# Patient Record
Sex: Male | Born: 1965
Health system: Southern US, Community
[De-identification: ages and names within clinical notes are randomized; demographics above are authoritative.]

## PROBLEM LIST (undated history)

## (undated) DIAGNOSIS — I10 Essential (primary) hypertension: Secondary | ICD-10-CM

## (undated) DIAGNOSIS — F17200 Nicotine dependence, unspecified, uncomplicated: Secondary | ICD-10-CM

## (undated) DIAGNOSIS — J90 Pleural effusion, not elsewhere classified: Secondary | ICD-10-CM

## (undated) DIAGNOSIS — I2510 Atherosclerotic heart disease of native coronary artery without angina pectoris: Secondary | ICD-10-CM

## (undated) DIAGNOSIS — I639 Cerebral infarction, unspecified: Secondary | ICD-10-CM

## (undated) DIAGNOSIS — I219 Acute myocardial infarction, unspecified: Secondary | ICD-10-CM

## (undated) DIAGNOSIS — E785 Hyperlipidemia, unspecified: Secondary | ICD-10-CM

## (undated) DIAGNOSIS — K219 Gastro-esophageal reflux disease without esophagitis: Secondary | ICD-10-CM

## (undated) HISTORY — PX: KNEE SURGERY: SHX244

## (undated) HISTORY — PX: BACK SURGERY: SHX140

## (undated) HISTORY — DX: Pleural effusion, not elsewhere classified: J90

## (undated) HISTORY — DX: Cerebral infarction, unspecified: I63.9

## (undated) HISTORY — DX: Acute myocardial infarction, unspecified: I21.9

## (undated) HISTORY — PX: KIDNEY TRANSPLANT: SHX239

## (undated) HISTORY — PX: CORONARY ARTERY BYPASS GRAFT: SHX141

---

## 2007-07-24 ENCOUNTER — Emergency Department (HOSPITAL_COMMUNITY): Admission: EM | Admit: 2007-07-24 | Discharge: 2007-07-24 | Payer: Self-pay | Admitting: Emergency Medicine

## 2010-10-14 ENCOUNTER — Emergency Department (HOSPITAL_COMMUNITY): Payer: Medicare Other

## 2010-10-14 ENCOUNTER — Emergency Department (HOSPITAL_COMMUNITY)
Admission: EM | Admit: 2010-10-14 | Discharge: 2010-10-14 | Disposition: A | Discharge status: Home / Self Care | Payer: Medicare Other | Attending: Emergency Medicine | Admitting: Emergency Medicine

## 2010-10-14 DIAGNOSIS — R109 Unspecified abdominal pain: Secondary | ICD-10-CM | POA: Insufficient documentation

## 2010-10-14 DIAGNOSIS — R222 Localized swelling, mass and lump, trunk: Secondary | ICD-10-CM | POA: Insufficient documentation

## 2010-10-14 DIAGNOSIS — K7689 Other specified diseases of liver: Secondary | ICD-10-CM | POA: Insufficient documentation

## 2010-10-14 DIAGNOSIS — N433 Hydrocele, unspecified: Secondary | ICD-10-CM | POA: Insufficient documentation

## 2010-10-14 DIAGNOSIS — K5730 Diverticulosis of large intestine without perforation or abscess without bleeding: Secondary | ICD-10-CM | POA: Insufficient documentation

## 2010-10-14 LAB — COMPREHENSIVE METABOLIC PANEL
ALT: 40 U/L (ref 0–53)
AST: 30 U/L (ref 0–37)
Albumin: 4.5 g/dL (ref 3.5–5.2)
Alkaline Phosphatase: 81 U/L (ref 39–117)
BUN: 15 mg/dL (ref 6–23)
Chloride: 100 mEq/L (ref 96–112)
Potassium: 3.8 mEq/L (ref 3.5–5.1)
Sodium: 136 mEq/L (ref 135–145)
Total Bilirubin: 1.2 mg/dL (ref 0.3–1.2)
Total Protein: 7.6 g/dL (ref 6.0–8.3)

## 2010-10-14 LAB — CBC
HCT: 49.2 % (ref 39.0–52.0)
MCHC: 35 g/dL (ref 30.0–36.0)
RDW: 12.5 % (ref 11.5–15.5)
WBC: 12.8 10*3/uL — ABNORMAL HIGH (ref 4.0–10.5)

## 2010-10-14 LAB — DIFFERENTIAL
Basophils Absolute: 0.1 10*3/uL (ref 0.0–0.1)
Basophils Relative: 1 % (ref 0–1)
Eosinophils Relative: 1 % (ref 0–5)
Lymphocytes Relative: 22 % (ref 12–46)
Monocytes Absolute: 0.6 10*3/uL (ref 0.1–1.0)
Neutro Abs: 9.1 10*3/uL — ABNORMAL HIGH (ref 1.7–7.7)

## 2010-10-14 MED ORDER — IOHEXOL 300 MG/ML  SOLN
100.0000 mL | Freq: Once | INTRAMUSCULAR | Status: AC | PRN
  Administered 2010-10-14: 100 mL via INTRAVENOUS

## 2010-10-26 NOTE — Consult Note (Signed)
Brandon Martin, Brandon Martin                  ACCOUNT NO.:  [id number]  MEDICAL RECORD NO.:  [id number]           PATIENT TYPE:  E  LOCATION:  WLED                         FACILITY:  [facility]  PHYSICIAN:  Anselm Pancoast. Lucylle Foulkes, M.D.DATE OF BIRTH:  1966/05/29  DATE OF CONSULTATION:  10/14/2010 DATE OF DISCHARGE:                                CONSULTATION   CHIEF COMPLAINT:  Right inguinal hernia.  REFERRING PHYSICIAN:  ER, Samuel Jester, D.O.  PRIMARY CARE PHYSICIAN:  Joycelyn Rua, M.D., [facility].  BRIEF HISTORY:  The patient is a 45 year old gentleman who reports that 6 months ago, he felt an area starting to get bigger.  He described it in his right lower quadrant.  It sounds like he has been having some discomfort on and off ever since that time but he reports over the last 2-3 days, pain has "got worse and worse."  It was enough to finally make him go see Dr. Lenise Arena of [facility] and after an office evaluation, he was sent to the ER with a preliminary diagnosis of a right inguinal hernia.  We were asked see the patient by Dr. Lenise Arena and the ER physician, Dr. Clarene Duke.  PAST MEDICAL HISTORY: 1. He has had a fractured back with sciatica.  He has chronic pain and     intermittent numbness on the left. 2. History of peptic ulcer disease. 3. Single kidney, he donated his left kidney to a brother in renal     failure. 4. COPD/tobacco use 1-1/2 packs a day.  PAST SURGICAL HISTORY: 1. Multiple back surgeries in 1999. 2. Status post left nephrectomy, a donor kidney for his brother. 3. Multiple surgeries on his left knee.  FAMILY HISTORY:  Mother is living, has high blood pressure, some arthritis and some neuropathy.  Father died with cancer of the lungs. One brother is deceased with renal failure, one brother has asthma, and one sister in good health.  SOCIAL HISTORY:  Smokes least a pack and a half a day 30+ years, probably since he was 58 or 13 when he  started.  Alcohol: At least a 12-pack or more per week.  Drugs: none.  He is unemployed on disability secondary to his back problems.  REVIEW OF SYSTEMS:  FEVER:  No changes.  SKIN:  No changes.  PSYCH:  No changes.  WEIGHT:  No changes.  CEREBROVASCULAR:  No history of strokes, seizures, syncope, or presyncope.  PULMONARY:  No orthopnea.  No PND. He has a dry cough.  He is unaware of any wheezing.  No recent URIs.  He does have chronic sinus problems.  CARDIAC:  No history of chest pain, angina, or cardiac workup.  GI:  Positive for GERD.  No nausea or vomiting.  No diarrhea.  Some constipation.  No blood in his stool.  His last BM was yesterday.  GU:  Says it burns when he pees.  LOWER EXTREMITIES:  No edema.  No claudication.  ENDOCRINE:  Negative. MUSCULOSKELETAL:  Left knee swells and he has arthritis.  HOME MEDICATIONS:  Prilosec OTC, otherwise none.  ALLERGIES: 1. ASPIRIN makes  his ulcers bleed. 2. MORPHINE causes nausea and vomiting.  PHYSICAL EXAMINATION:  GENERAL:  This is a well-nourished, well- developed white male in no acute distress. VITAL SIGNS:  Temperature was 98, blood pressure 155/98, heart rate 93, respiratory rate is 16, and 97% on room air. HEAD:  Normocephalic. EYES:  Pupils are equal. EARS, NOSE, THROAT, AND MOUTH:  Essentially normal but he is edentulous. NECK:  Trachea is in the midline.  Thyroid is nonpalpable.  No bruits. No JVD. CHEST:  Clear to auscultation but he has wheezing bilaterally. CARDIAC:  Normal S1-S2.  Pulses are +2 and equal bilaterally. ABDOMEN:  Bowel sounds are present.  He is slightly distended, slightly tender mostly in the right lower quadrant.  He has swelling and firmness in his right lower quadrant going down to his inguinal area.  I did not feel hernia.  There is nothing that I could reduce.  The scrotum is extremely full and distended.  The testes were not individually palpable.  The penis is normal. LYMPHADENOPATHY:   None. NEUROLOGIC:  No focal deficits. PSYCH:  Normal affect.  LABORATORY DATA:  Lipase 72.  White count 12.8, hemoglobin 17, hematocrit 49, and platelets 255,000.  Sodium is 136, potassium is 3.8, chloride is 100, CO2 is 26, BUN is 15, creatinine is 1.08, total bilirubin was 1.2, alk phos 81, SGOT 30, SGPT is 40.  Flat plate of the abdomen shows contrast through the bowel which was essentially normal. There is no free air.  Chest x-ray was normal.  CT scan of the chest shows no right inguinal hernia.  There is no incarceration.  There is a large hydrocele.  The large and small bowel was normal.  This is a preliminary reading, final reading is pending.  IMPRESSION: 1. Large right hydrocele. 2. Chronic obstructive pulmonary disease, tobacco use. 3. Disabled secondary to back surgeries. 4. Single right kidney status post left nephrectomy as a donor kidney     for his brother. 5. History of peptic ulcer disease. 6. Multiple knee surgeries.  PLAN:  I have gone over the CT with radiologist.  They do not believe he has incarcerated hernia.  His problem is a hydrocele.  Discussed this with Dr. Clarene Duke and she will plan to call Urology.  We will follow up as needed.    Eber Hong, P.A.   ______________________________ Anselm Pancoast. Zachery Dakins, M.D.    WDJ/MEDQ  D:  10/14/2010  T:  10/14/2010  Job:  [id number]  cc:   Joycelyn Rua, M.D. Fax: [phone]  Electronically Signed by Sherrie George P.A. on 10/21/2010 05:23:10 PM Electronically Signed by Consuello Bossier M.D. on 10/26/2010 08:54:00 AM

## 2010-11-17 ENCOUNTER — Ambulatory Visit (HOSPITAL_BASED_OUTPATIENT_CLINIC_OR_DEPARTMENT_OTHER)
Admission: RE | Admit: 2010-11-17 | Discharge: 2010-11-17 | Disposition: A | Discharge status: Home / Self Care | Payer: Medicare Other | Source: Ambulatory Visit | Attending: Urology | Admitting: Urology

## 2010-11-17 DIAGNOSIS — F17200 Nicotine dependence, unspecified, uncomplicated: Secondary | ICD-10-CM | POA: Insufficient documentation

## 2010-11-17 DIAGNOSIS — Z79899 Other long term (current) drug therapy: Secondary | ICD-10-CM | POA: Insufficient documentation

## 2010-11-17 DIAGNOSIS — Z905 Acquired absence of kidney: Secondary | ICD-10-CM | POA: Insufficient documentation

## 2010-11-17 DIAGNOSIS — K219 Gastro-esophageal reflux disease without esophagitis: Secondary | ICD-10-CM | POA: Insufficient documentation

## 2010-11-17 DIAGNOSIS — N433 Hydrocele, unspecified: Secondary | ICD-10-CM | POA: Insufficient documentation

## 2010-11-27 NOTE — Op Note (Signed)
NAMEZACHARIAN, Brandon Martin                  ACCOUNT NO.:  [id number]  MEDICAL RECORD NO.:  [id number]           PATIENT TYPE:  LOCATION:                                 FACILITY:  PHYSICIAN:  Danae Chen, M.D.  DATE OF BIRTH:  January 11, 1966  DATE OF PROCEDURE:  11/17/2010 DATE OF DISCHARGE:                              OPERATIVE REPORT   PREOPERATIVE DIAGNOSIS:  Right hydrocele.  POSTOPERATIVE DIAGNOSIS:  Right hydrocele.  PROCEDURE:  Right hydrocelectomy.  SURGEON:  Danae Chen, M.D.  ANESTHESIA:  General.  INDICATIONS:  The patient is a 45 year old male who has been complaining of increasing swelling of the scrotum for about 2 months now.  He was treated by Dr. Vonita Moss for epididymitis and the swelling has gradually increased in size.  He was seen in the emergency room because of pain and a scrotal ultrasound showed right hydrocele and a CT scan of the abdomen and pelvis showed right hydrocele without evidence of hernia. Since he has continued to complain of pain and the swelling has increased in size, he was seen by Marcello Fennel and he was advised to have right hydrocelectomy.  I saw him in the office yesterday and discussed the procedure, the risks and benefits with him.  The risks include but are not limited to hemorrhage, infection, hematoma recurrence.  He understood and then gave informed consent.  The patient was identified by his wristband and proper time-out was taken.  PROCEDURE IN DETAIL:  Under general anesthesia, he was prepped and draped and placed in the supine position.  The scrotum was infiltrated with 0.25% Marcaine.  Then, a longitudinal incision was made on the right scrotum.  The incision was carried down to the tunica vaginalis, which was then incised. 500 ml of clear yellowish fluid  were drained out of the hydrocele sac.  The appendix testis was excised.  Then, the tunica vaginalis was plicated using the Lord's technique with #3-0 chromic. Hemostasis  was completed with electrocautery.  Then, the subcutaneous tissue was closed with #3-0 chromic and the skin was closed with #3-0 chromic.  The patient tolerated the procedure well and left the OR in satisfactory condition to post-anesthesia care unit.    Danae Chen, M.D.    MN/MEDQ  D:  11/17/2010  T:  11/17/2010  Job:  [id number]  Electronically Signed by Lindaann Slough M.D. on 11/27/2010 11:48:59 AM

## 2011-04-01 LAB — BASIC METABOLIC PANEL
BUN: 17
CO2: 25
Chloride: 105
Creatinine, Ser: 1.02
Glucose, Bld: 120 — ABNORMAL HIGH
Potassium: 4.2

## 2011-04-01 LAB — POCT CARDIAC MARKERS
CKMB, poc: 2.3
Troponin i, poc: <0.05

## 2011-05-08 ENCOUNTER — Emergency Department (HOSPITAL_COMMUNITY): Payer: Medicare Other

## 2011-05-08 ENCOUNTER — Inpatient Hospital Stay (HOSPITAL_COMMUNITY)
Admission: EM | Admit: 2011-05-08 | Discharge: 2011-05-17 | DRG: 234 | Disposition: A | Discharge status: Home / Self Care | Payer: Medicare Other | Attending: Thoracic Surgery (Cardiothoracic Vascular Surgery) | Admitting: Thoracic Surgery (Cardiothoracic Vascular Surgery)

## 2011-05-08 DIAGNOSIS — F1010 Alcohol abuse, uncomplicated: Secondary | ICD-10-CM | POA: Diagnosis present

## 2011-05-08 DIAGNOSIS — I214 Non-ST elevation (NSTEMI) myocardial infarction: Principal | ICD-10-CM | POA: Diagnosis present

## 2011-05-08 DIAGNOSIS — K219 Gastro-esophageal reflux disease without esophagitis: Secondary | ICD-10-CM | POA: Diagnosis present

## 2011-05-08 DIAGNOSIS — E162 Hypoglycemia, unspecified: Secondary | ICD-10-CM | POA: Diagnosis not present

## 2011-05-08 DIAGNOSIS — Z79899 Other long term (current) drug therapy: Secondary | ICD-10-CM

## 2011-05-08 DIAGNOSIS — J9819 Other pulmonary collapse: Secondary | ICD-10-CM | POA: Diagnosis not present

## 2011-05-08 DIAGNOSIS — F1210 Cannabis abuse, uncomplicated: Secondary | ICD-10-CM | POA: Diagnosis present

## 2011-05-08 DIAGNOSIS — I2510 Atherosclerotic heart disease of native coronary artery without angina pectoris: Secondary | ICD-10-CM | POA: Diagnosis present

## 2011-05-08 DIAGNOSIS — F17200 Nicotine dependence, unspecified, uncomplicated: Secondary | ICD-10-CM | POA: Diagnosis present

## 2011-05-08 DIAGNOSIS — D62 Acute posthemorrhagic anemia: Secondary | ICD-10-CM | POA: Diagnosis not present

## 2011-05-08 LAB — DIFFERENTIAL
Basophils Absolute: 0 10*3/uL (ref 0.0–0.1)
Eosinophils Relative: 0 % (ref 0–5)
Lymphocytes Relative: 11 % — ABNORMAL LOW (ref 12–46)
Lymphs Abs: 1.7 10*3/uL (ref 0.7–4.0)
Neutro Abs: 12.3 10*3/uL — ABNORMAL HIGH (ref 1.7–7.7)
Neutrophils Relative %: 84 % — ABNORMAL HIGH (ref 43–77)

## 2011-05-08 LAB — BASIC METABOLIC PANEL
Calcium: 9.4 mg/dL (ref 8.4–10.5)
Chloride: 102 mEq/L (ref 96–112)
Creatinine, Ser: 0.88 mg/dL (ref 0.50–1.35)
GFR calc Af Amer: >90 mL/min (ref >90)
Sodium: 135 mEq/L (ref 135–145)

## 2011-05-08 LAB — CBC
HCT: 42.5 % (ref 39.0–52.0)
RBC: 4.51 MIL/uL (ref 4.22–5.81)
RDW: 12.6 % (ref 11.5–15.5)
WBC: 14.6 10*3/uL — ABNORMAL HIGH (ref 4.0–10.5)

## 2011-05-08 LAB — CK TOTAL AND CKMB (NOT AT ARMC)
CK, MB: 18.7 ng/mL (ref 0.3–4.0)
Total CK: 232 U/L (ref 7–232)

## 2011-05-08 LAB — TROPONIN I: Troponin I: 2.34 ng/mL (ref <0.30)

## 2011-05-08 LAB — MRSA PCR SCREENING: MRSA by PCR: NEGATIVE

## 2011-05-08 LAB — POCT I-STAT TROPONIN I

## 2011-05-08 LAB — PROTIME-INR
INR: 0.91 (ref 0.00–1.49)
Prothrombin Time: 12.4 seconds (ref 11.6–15.2)

## 2011-05-09 LAB — CARDIAC PANEL(CRET KIN+CKTOT+MB+TROPI)
CK, MB: 39.9 ng/mL (ref 0.3–4.0)
Relative Index: 8.5 — ABNORMAL HIGH (ref 0.0–2.5)
Relative Index: 7.8 — ABNORMAL HIGH (ref 0.0–2.5)
Total CK: 467 U/L — ABNORMAL HIGH (ref 7–232)
Total CK: 629 U/L — ABNORMAL HIGH (ref 7–232)
Troponin I: 10.09 ng/mL (ref <0.30)

## 2011-05-09 LAB — BASIC METABOLIC PANEL
BUN: 18 mg/dL (ref 6–23)
Calcium: 9 mg/dL (ref 8.4–10.5)
Creatinine, Ser: 0.88 mg/dL (ref 0.50–1.35)
GFR calc Af Amer: >90 mL/min (ref >90)
GFR calc non Af Amer: >90 mL/min (ref >90)
Glucose, Bld: 112 mg/dL — ABNORMAL HIGH (ref 70–99)
Potassium: 3.9 mEq/L (ref 3.5–5.1)

## 2011-05-09 LAB — CBC
HCT: 40.7 % (ref 39.0–52.0)
Hemoglobin: 14.1 g/dL (ref 13.0–17.0)
MCH: 32.7 pg (ref 26.0–34.0)
MCHC: 34.6 g/dL (ref 30.0–36.0)
RDW: 12.6 % (ref 11.5–15.5)

## 2011-05-09 LAB — LIPID PANEL: LDL Cholesterol: 146 mg/dL — ABNORMAL HIGH (ref 0–99)

## 2011-05-10 DIAGNOSIS — I2510 Atherosclerotic heart disease of native coronary artery without angina pectoris: Secondary | ICD-10-CM

## 2011-05-10 LAB — URINALYSIS, ROUTINE W REFLEX MICROSCOPIC
Bilirubin Urine: NEGATIVE
Glucose, UA: NEGATIVE mg/dL
Ketones, ur: NEGATIVE mg/dL
Nitrite: NEGATIVE
Specific Gravity, Urine: 1.005 (ref 1.005–1.030)
pH: 6 (ref 5.0–8.0)

## 2011-05-10 LAB — URINE MICROSCOPIC-ADD ON

## 2011-05-11 ENCOUNTER — Inpatient Hospital Stay (HOSPITAL_COMMUNITY): Payer: Medicare Other

## 2011-05-11 DIAGNOSIS — I2510 Atherosclerotic heart disease of native coronary artery without angina pectoris: Secondary | ICD-10-CM

## 2011-05-11 DIAGNOSIS — Z01810 Encounter for preprocedural cardiovascular examination: Secondary | ICD-10-CM

## 2011-05-11 LAB — TYPE AND SCREEN: Antibody Screen: NEGATIVE

## 2011-05-11 LAB — POCT I-STAT 3, ART BLOOD GAS (G3+)
Bicarbonate: 26.1 mEq/L — ABNORMAL HIGH (ref 20.0–24.0)
O2 Saturation: 94 %
TCO2: 27 mmol/L (ref 0–100)
pCO2 arterial: 38 mmHg (ref 35.0–45.0)
pH, Arterial: 7.444 (ref 7.350–7.450)

## 2011-05-11 LAB — SURGICAL PCR SCREEN
MRSA, PCR: NEGATIVE
Staphylococcus aureus: NEGATIVE

## 2011-05-11 LAB — COMPREHENSIVE METABOLIC PANEL
AST: 43 U/L — ABNORMAL HIGH (ref 0–37)
Albumin: 3.2 g/dL — ABNORMAL LOW (ref 3.5–5.2)
Alkaline Phosphatase: 60 U/L (ref 39–117)
BUN: 13 mg/dL (ref 6–23)
Chloride: 99 mEq/L (ref 96–112)
Creatinine, Ser: 0.95 mg/dL (ref 0.50–1.35)
Potassium: 4.1 mEq/L (ref 3.5–5.1)
Total Protein: 6.7 g/dL (ref 6.0–8.3)

## 2011-05-11 LAB — HEMOGLOBIN A1C: Mean Plasma Glucose: 117 mg/dL — ABNORMAL HIGH (ref <117)

## 2011-05-11 LAB — PULMONARY FUNCTION TEST

## 2011-05-12 ENCOUNTER — Inpatient Hospital Stay (HOSPITAL_COMMUNITY): Payer: Medicare Other

## 2011-05-12 DIAGNOSIS — I2510 Atherosclerotic heart disease of native coronary artery without angina pectoris: Secondary | ICD-10-CM

## 2011-05-12 LAB — POCT I-STAT 3, ART BLOOD GAS (G3+)
Acid-base deficit: 5 mmol/L — ABNORMAL HIGH (ref 0.0–2.0)
Bicarbonate: 25.4 mEq/L — ABNORMAL HIGH (ref 20.0–24.0)
Bicarbonate: 23.8 mEq/L (ref 20.0–24.0)
Patient temperature: 36
TCO2: 27 mmol/L (ref 0–100)
TCO2: 25 mmol/L (ref 0–100)
pCO2 arterial: 49.4 mmHg — ABNORMAL HIGH (ref 35.0–45.0)
pH, Arterial: 7.399 (ref 7.350–7.450)
pH, Arterial: 7.281 — ABNORMAL LOW (ref 7.350–7.450)
pO2, Arterial: 71 mmHg — ABNORMAL LOW (ref 80.0–100.0)
pO2, Arterial: 84 mmHg (ref 80.0–100.0)

## 2011-05-12 LAB — GLUCOSE, CAPILLARY
Glucose-Capillary: 15 mg/dL — CL (ref 70–99)
Glucose-Capillary: 217 mg/dL — ABNORMAL HIGH (ref 70–99)
Glucose-Capillary: 33 mg/dL — CL (ref 70–99)
Glucose-Capillary: 55 mg/dL — ABNORMAL LOW (ref 70–99)
Glucose-Capillary: 145 mg/dL — ABNORMAL HIGH (ref 70–99)

## 2011-05-12 LAB — CBC
MCH: 32.7 pg (ref 26.0–34.0)
Platelets: 208 10*3/uL (ref 150–400)
Platelets: 163 10*3/uL (ref 150–400)
Platelets: 143 10*3/uL — ABNORMAL LOW (ref 150–400)
RBC: 4.22 MIL/uL (ref 4.22–5.81)
RBC: 3.61 MIL/uL — ABNORMAL LOW (ref 4.22–5.81)
RDW: 12.2 % (ref 11.5–15.5)
RDW: 12.4 % (ref 11.5–15.5)
WBC: 11.1 10*3/uL — ABNORMAL HIGH (ref 4.0–10.5)
WBC: 10.7 10*3/uL — ABNORMAL HIGH (ref 4.0–10.5)

## 2011-05-12 LAB — POCT I-STAT 4, (NA,K, GLUC, HGB,HCT)
Glucose, Bld: 111 mg/dL — ABNORMAL HIGH (ref 70–99)
Glucose, Bld: 25 mg/dL — CL (ref 70–99)
HCT: 36 % — ABNORMAL LOW (ref 39.0–52.0)
HCT: 36 % — ABNORMAL LOW (ref 39.0–52.0)
Hemoglobin: 12.2 g/dL — ABNORMAL LOW (ref 13.0–17.0)
Hemoglobin: 12.2 g/dL — ABNORMAL LOW (ref 13.0–17.0)
Hemoglobin: 10.5 g/dL — ABNORMAL LOW (ref 13.0–17.0)
Hemoglobin: 12.2 g/dL — ABNORMAL LOW (ref 13.0–17.0)
Potassium: 3.9 mEq/L (ref 3.5–5.1)
Sodium: 136 mEq/L (ref 135–145)
Sodium: 134 mEq/L — ABNORMAL LOW (ref 135–145)
Sodium: 133 mEq/L — ABNORMAL LOW (ref 135–145)

## 2011-05-12 LAB — HEMOGLOBIN AND HEMATOCRIT, BLOOD
HCT: 30.8 % — ABNORMAL LOW (ref 39.0–52.0)
Hemoglobin: 10.8 g/dL — ABNORMAL LOW (ref 13.0–17.0)

## 2011-05-12 LAB — POCT I-STAT, CHEM 8
Calcium, Ion: 1.2 mmol/L (ref 1.12–1.32)
HCT: 34 % — ABNORMAL LOW (ref 39.0–52.0)
TCO2: 23 mmol/L (ref 0–100)

## 2011-05-12 LAB — BASIC METABOLIC PANEL
Chloride: 99 mEq/L (ref 96–112)
GFR calc Af Amer: >90 mL/min (ref >90)
Potassium: 3.9 mEq/L (ref 3.5–5.1)
Sodium: 135 mEq/L (ref 135–145)

## 2011-05-12 LAB — CREATININE, SERUM
GFR calc Af Amer: >90 mL/min (ref >90)
GFR calc non Af Amer: >90 mL/min (ref >90)

## 2011-05-12 LAB — PLATELET COUNT: Platelets: 188 10*3/uL (ref 150–400)

## 2011-05-12 LAB — PROTIME-INR
INR: 1.35 (ref 0.00–1.49)
Prothrombin Time: 16.9 seconds — ABNORMAL HIGH (ref 11.6–15.2)

## 2011-05-13 ENCOUNTER — Encounter: Payer: Self-pay | Admitting: Thoracic Surgery (Cardiothoracic Vascular Surgery)

## 2011-05-13 ENCOUNTER — Inpatient Hospital Stay (HOSPITAL_COMMUNITY): Payer: Medicare Other

## 2011-05-13 LAB — CBC
HCT: 32.4 % — ABNORMAL LOW (ref 39.0–52.0)
Hemoglobin: 11 g/dL — ABNORMAL LOW (ref 13.0–17.0)
MCHC: 34 g/dL (ref 30.0–36.0)
MCHC: 33.5 g/dL (ref 30.0–36.0)
RBC: 3.36 MIL/uL — ABNORMAL LOW (ref 4.22–5.81)
RDW: 12.5 % (ref 11.5–15.5)
WBC: 9.4 10*3/uL (ref 4.0–10.5)
WBC: 12.3 10*3/uL — ABNORMAL HIGH (ref 4.0–10.5)

## 2011-05-13 LAB — GLUCOSE, CAPILLARY
Glucose-Capillary: 105 mg/dL — ABNORMAL HIGH (ref 70–99)
Glucose-Capillary: 119 mg/dL — ABNORMAL HIGH (ref 70–99)
Glucose-Capillary: 124 mg/dL — ABNORMAL HIGH (ref 70–99)
Glucose-Capillary: 130 mg/dL — ABNORMAL HIGH (ref 70–99)

## 2011-05-13 LAB — CREATININE, SERUM
Creatinine, Ser: 0.99 mg/dL (ref 0.50–1.35)
GFR calc non Af Amer: >90 mL/min (ref >90)

## 2011-05-13 LAB — BASIC METABOLIC PANEL
CO2: 23 mEq/L (ref 19–32)
Chloride: 105 mEq/L (ref 96–112)
GFR calc non Af Amer: 89 mL/min — ABNORMAL LOW (ref >90)
Glucose, Bld: 134 mg/dL — ABNORMAL HIGH (ref 70–99)
Potassium: 4.7 mEq/L (ref 3.5–5.1)
Sodium: 136 mEq/L (ref 135–145)

## 2011-05-13 LAB — POCT I-STAT, CHEM 8
Calcium, Ion: 1.16 mmol/L (ref 1.12–1.32)
Glucose, Bld: 154 mg/dL — ABNORMAL HIGH (ref 70–99)
HCT: 33 % — ABNORMAL LOW (ref 39.0–52.0)
Hemoglobin: 11.2 g/dL — ABNORMAL LOW (ref 13.0–17.0)
Potassium: 4.4 mEq/L (ref 3.5–5.1)

## 2011-05-13 NOTE — Consult Note (Signed)
Brandon Martin, Brandon Martin                  ACCOUNT NO.:  [id number]  MEDICAL RECORD NO.:  [id number]  LOCATION:  2916                         FACILITY:  [facility]  PHYSICIAN:  Willis Modena, MD     DATE OF BIRTH:  1966/05/04  DATE OF CONSULTATION:  05/11/2011 DATE OF DISCHARGE:                                CONSULTATION   REASON FOR CONSULTATION:  History of ulcers, evaluate for preoperative EGD prior to CABG.  CHIEF COMPLAINT:  Chest pain.  HISTORY OF PRESENT ILLNESS:  Brandon Martin is a 45 year old gentleman admitted with a inferior myocardial infarction.  CABG is planned for tomorrow.  I was asked to see Brandon Martin by Dr. Algie Martin to evaluate for need for preoperative endoscopy.  He describes longstanding acid reflux disease and 2 prior ulcer bleeds, first about 20 years ago, second about 6 years ago, all in the setting of high dose NSAID use.  He does have long-standing acid reflux but no abdominal pain, nausea, vomiting, dysphagia, odynophagia, or melena/hematochezia.  He is still having some chest and shoulder discomfort.  PAST MEDICAL HISTORY/PAST SURGICAL HISTORY/HOME MEDICATIONS/ALLERGIES/FAMILY HISTORY/SOCIAL HISTORY/REVIEW OF SYSTEMS: All from the handwritten note from Dr. Algie Martin dated May 08, 2011. I have reviewed and I agree.  PHYSICAL EXAMINATION:  GENERAL:  Brandon Martin is in no acute distress.  He does appear older than stated age. NEUROLOGIC:  Diffusely nonfocal without lateralizing signs. HEENT:  Poor dentition.  No oropharyngeal lesions.  Eyes:  Sclerae anicteric.  Conjunctivae pink.  NECK:  Supple without thyromegaly, lymphadenopathy, or bruits. HEART:  Regular rhythm.  Normal rate. LUNGS:  Clear to auscultation. ABDOMEN:  Soft, nontender, nondistended.  Normoactive bowel sounds. EXTREMITIES:  No peripheral cyanosis, clubbing, or edema.  LABORATORY STUDIES:  Hemoglobin is 14.1, white count 11.8, platelet count 234,000.  Sodium 134, potassium 4.1, chloride 99, bicarb  26, BUN 13, creatinine 0.95.  Liver tests normal except for an AST of 43.  His CK, CK-MB, and troponins are all elevated.  IMPRESSION:  Brandon Martin is a 45 year old gentleman with history of reflux disease and ulcer bleeds who is scheduled for coronary artery bypass graft tomorrow for inferior myocardial infarction.  I have been asked for any preoperative recommendation.  PLAN: 1. I would suggest proton pump inhibitor twice a day now and for the     next 6 weeks. 2. Given his lack of GI symptoms, I do not think a preoperative     endoscopy would be too high yield.  Moreover, he is still having     some smoldering chest pain symptoms at this time. 3. If he has any bleeding postoperatively in the setting of anti-     platelet therapy, we certainly will need to     reconsider endoscopic evaluation. 4. We will sign off.  Please call us with questions.  Thanks again for allowing to participate in Brandon Martin care.     Willis Modena, MD     WO/MEDQ  D:  05/11/2011  T:  05/11/2011  Job:  [id number]  Electronically Signed by Willis Modena  on 05/13/2011 78:46:96 PM

## 2011-05-13 NOTE — Op Note (Signed)
NAMEKALIQ, CONGLETON                  ACCOUNT NO.:  [id number]  MEDICAL RECORD NO.:  [id number]  LOCATION:  2305                         FACILITY:  [facility]  PHYSICIAN:  Salvatore Decent. Dorris Fetch, M.D.DATE OF BIRTH:  [birth year]  DATE OF PROCEDURE:  05/12/2011 DATE OF DISCHARGE:                              OPERATIVE REPORT   PREOPERATIVE DIAGNOSIS:  Severe three-vessel coronary artery disease, status post myocardial infarction.  POSTOPERATIVE DIAGNOSIS:  Severe three-vessel coronary artery disease, status post myocardial infarction.  PROCEDURE:  Median sternotomy, extracorporeal circulation, coronary artery bypass grafting x3 (left internal mammary artery to left anterior descending, left radial artery to posterior descending, saphenous vein graft to obtuse marginal 1), open left radial harvest, endoscopic vein harvest right thigh.  SURGEON:  Salvatore Decent. Dorris Fetch, MD  ASSISTANT:  Rowe Clack, PA-C  ANESTHESIA:  General.  FINDINGS:  Good quality conduits, good quality targets.  CLINICAL NOTE:  Mr. Verhagen is a 45 year old male who presented with unstable chest pain.  He ruled in for a myocardial infarction.  He underwent cardiac catheterization which revealed severe three-vessel coronary disease.  His right coronary was subtotally occluded with sluggish flow.  There was moderate disease in the circumflex and high- grade disease in the proximal LAD.  The patient was advised to undergo coronary artery bypass grafting with plan to use left mammary, left radial artery, and saphenous vein.  The indications risks, benefits, and alternatives were discussed in detail with the patient.  He understood and accepted the risks and agreed to proceed.  He did understand there was an excellent chance of procedural success but this was a palliative and not a curative operation, and he could have further issues with coronary disease in the future.  OPERATIVE NOTE:  Mr. Heifner was brought to the preop  holding area on May 12, 2011.  There the Anesthesia Service placed a Swan-Ganz catheter and arterial blood pressure monitoring line.  He was taken to the operating room, anesthetized, and intubated.  Intravenous antibiotics were administered, a Foley catheter was placed.  The chest, abdomen, and legs and left arm were prepped and draped in usual sterile fashion.  Incision made in the medial aspect of the right leg at the level of the knee.  The greater saphenous vein was harvested from the right thigh endoscopically.  This was a good quality vessel. Simultaneously with this, an incision was made over the volar aspect of the left wrist and was carried through the skin and subcutaneous tissue. The fascia was incised.  The radial artery was identified and the short segment of the radial artery was dissected free, and there was a good distal pulse with proximal occlusion.  Incision then was lengthened up to just below the antecubital fossa and the radial artery was harvested using the Harmonic scalpel.  The radial was a good quality vessel.  A median sternotomy was performed, and the left internal mammary artery was harvested using standard technique, 5000 units of heparin was administered during the harvest of the vessels.  Remainder of the full heparin dose was given prior to opening the pericardium, and after closure of the radial incision.  After the mammary artery had  been harvested, the radial incision was closed in 2 layers.  When this was closed, the arm was wrapped and tucked to the patient's side.  The full heparin dose was given.  After confirming adequate anticoagulation with ACT measurement, the pericardium was opened.  The ascending aorta was inspected.  It was very small and approximately 2 cm in diameter.  There was no evidence of atherosclerotic disease.  The aorta was cannulated via concentric 2-0 Ethibond pledgeted pursestring sutures.  A dual stage venous cannula  was placed via pursestring suture in the right atrial appendage. Cardiopulmonary bypass was instituted.  The patient was cooled to 32 degrees Celsius.  The coronary arteries were inspected and anastomotic sites were chosen.  The conduits were inspected and cut to length.  A foam pad was placed in the pericardium to insulate the heart and protect the left phrenic nerve.  A temperature probe was placed in myocardial septum and a cardioplegic cannula placed in the ascending aorta.  The aorta was crossclamped.  The left ventricle was emptied via the aortic root vent.  Cardiac arrest was achieved with combination of cold antegrade blood cardioplegia and topical iced saline.  Initial 1 L of cardioplegia was administered.  Initially there was rapid septal cooling.  However this stopped and shortly after the cardioplegia was discontinued and there was rapid septal rewarming, consistent with the crossclamp not being fully in place.  The heart began beating.  The crossclamp was replaced and the heart was once again arrested with cold cardioplegia.  At this time, there was septal cooling all way down to 10 degrees Celsius and a good diastolic arrest was maintained after the discontinuance of the cardioplegia.  Reverse saphenous vein graft was placed end-to-side to the first obtuse marginal branch of the left circumflex.  This was a 1.5 mm good quality target.  The vein was of good quality.  The anastomosis was performed end-to-side with a running 7-0 Prolene suture.  All anastomoses were probed proximally and distally at their completion to ensure patency. Cardioplegia was administered down the vein graft.  There was good flow and good hemostasis.  The distal end of the left radial artery was beveled it was then anastomosed end-to-side to the posterior descending branch of the right coronary, this was a 1.5 mm target vessel.  Probe did pass easily, proximally back into the distal right coronary  as well as distally within the vessel.  It was a good quality target.  The radial was good quality.  The anastomosis was performed end-to-side with a running 8-0 Prolene suture.  At completion of this graft, cardioplegia was administered.  There was good backbleeding from the radial artery. Bulldog clamp was placed across it.  After the entire arterial graft had been de-aired. Additional cardioplegia was administered.  The left internal mammary artery then was brought through a window in the pericardium.  The distal end was beveled and was anastomosed end-to-side to the distal LAD.  The LAD was a 2 mm good quality target.  The mammary was a 2 mm good quality conduit.  An end-to-side anastomosis was performed with a running 8-0 Prolene suture.  At the completion of the mammary to LAD anastomosis, the bulldog clamp was briefly removed to inspect for hemostasis. Immediate rapid septal rewarming was noted.  The bulldog clamp was replaced and the mammary pedicle was tacked to the epicardial surface of the heart with 6-0 Prolene sutures.  Additional cardioplegia was administered.  The vein graft and radial artery  grafts were cut to length.  The cardioplegic cannula then was removed from the ascending aorta.  The proximal anastomoses wereperformed to 4.5 mm punch aortotomies and 6-0 Prolene suture was used for the vein and a 7-0 Prolene suture used for the radial at completion of the second proximal anastomosis, the patient was placed in Trendelenburg position.  Lidocaine was administered.  The aortic root was de-aired and aortic crossclamp was removed.  The total crossclamp time was 66 minutes.  The patient required a single defibrillation was in sinus rhythm thereafter. While rewarming was completed, all proximal and distal anastomoses were inspected for hemostasis.  Epicardial pacing wires were placed on the right ventricle and right atrium.  DDD pacing was initiated, and the patient  rewarmed to a core temperature of 37 degrees Celsius.  He was weaned from cardiopulmonary bypass on the 1st attempt.  Total bypass time was 91 minutes.  The initial cardiac output was 5 L/min/m squared. The patient remained hemodynamically stable throughout the post bypass period.  A test dose of protamine was administered and was well tolerated.  The atrial and aortic cannulae were removed.  The remainder of the protamine was administered without incident.  Chest was irrigated with 1 L of warm normal saline containing 1 g of vancomycin.  Hemostasis was achieved. The left pleural and single mediastinal chest tube were placed as a separate subcostal incisions.  The pericardium was closed with interrupted 3-0 silk sutures.  It came together easily without tension or kinking the underlying grafts.  The sternum was closed with a combination of single and double heavy gauge stainless steel wires.  The pectoralis fascia, subcutaneous tissue, and skin were closed in standard fashion.  All sponge, needle, and instrument counts were correct at the end of the procedure.  There were no intraoperative complications.  The patient was taken from the operating room to the surgical intensive care unit in good condition.     Salvatore Decent Dorris Fetch, M.D.     SCH/MEDQ  D:  05/12/2011  T:  05/13/2011  Job:  [id number]  Electronically Signed by Charlett Lango M.D. on 05/13/2011 11:43:49 AM

## 2011-05-13 NOTE — Consult Note (Signed)
NAMECAMILLO, [facility]                  ACCOUNT NO.:  [id number]  MEDICAL RECORD NO.:  [id number]  LOCATION:  2916                         FACILITY:  [facility]  PHYSICIAN:  Salvatore Decent. Dorris Fetch, M.D.DATE OF BIRTH:  [birth year]  DATE OF CONSULTATION:  05/10/2011 DATE OF DISCHARGE:                                CONSULTATION   REASON FOR CONSULTATION:  Severe three-vessel disease, status post MI.  HISTORY OF PRESENT ILLNESS:  Mr. Clemmons is a 45 year old gentleman with no prior cardiac history, does have a history of tobacco abuse, and other substance abuse including marijuana.  He states that for about the past 2 weeks, he had been having some left shoulder pain, this mostly when he was up and around and active.  He did not make much of it, thought maybe he pulled a muscle.  Then on Saturday while doing some work, he noticed onset of chest pain with radiation to his left shoulder and then it radiated to his left arm, which then became numb and tingly.  He broke out and a sweat, and became diaphoretic and then experienced tightness in his neck.  He was taken to the emergency room and given a sublingual nitro with partial relief.  He did rule in for non-Q-wave MI.  He had a 2-D echo done the following day, the result was still pending.  He had catheterizations done today, which showed a severe three-vessel disease with subtotal occlusion of his right coronary artery.  He currently is pain free.  PAST MEDICAL HISTORY:  Significant for gastric ulcer disease versus gastritis with 2 major GI bleed, one 20 years ago after donating the kidney and one 6 years ago after taking multiple Goody's Powder in a short period of time.  No current symptoms, tobacco abuse, other substance abuse including marijuana and possible prescription drugs, and ethanol abuse with 4-8 beers per day.  He denies diabetes, hypertension, and hypercholesterolemia.  He was on no medications prior to admission.  FAMILY  HISTORY:  Father died early of cancer, but there is premature coronary artery disease on his mother's side of the family.  SOCIAL HISTORY:  Unmarried.  Substance abuse issues as previously noted.  REVIEW OF SYSTEMS:  Wears glasses.  No recent ulcer symptoms.  No recent change in bowel or bladder habits.  No weight changes.  All other systems are negative.  PHYSICAL EXAMINATION:  GENERAL:  Mr. Burkart is a 45 year old gentleman who appears older than his stated age. VITAL SIGNS:  Blood pressure is 136/55, pulse 83 and regular, respirations are 14. NEUROLOGIC:  He is well developed and well nourished.  Neurological, he is alert and oriented x3 with no focal deficits. HEENT:  Remarkable for poor dentition. NECK:  Without thyromegaly, adenopathy, or bruits. CARDIAC:  Has regular rate and rhythm.  Normal S1, S2.  No rubs, murmurs, or gallops. LUNGS:  Clear with equal breath sounds bilaterally. EXTREMITIES:  He has no peripheral edema.  Pulses are 2+ and equal, and symmetrical bilaterally.  He has a normal Allen's test on the left side.  LABORATORY DATA:  Hematocrit 42, white count 14.6, platelets 217.  Peak CK of 629 with MB of 49.1,  troponin of 8.42.  His cholesterol is 231, triglyceride is 223, HDL is 40, LDL is 146.  Sodium 137, potassium 3.9, BUN 18, creatinine 0.8, PTT 12.4, PTT 28.  IMPRESSION:  Mr. Erlich is a 45 year old gentleman with newly diagnosed hypercholesterolemia and who presents with a non-Q-wave myocardial infarction.  He has had a significant infarct with subtotal occlusion of his right coronary artery, which has very sluggish flow.  He currently is pain free and has had minimal discomfort since he has been in the hospital.  He has severe three-vessel disease to catheterization and the coronary artery bypass grafting is the best treatment option for both short and long-term relief of symptoms as well as providing a survival benefit versus angioplasty or medical therapy.   I discussed in detail with the patient the nature of the operation, need for general anesthesia, incisions to be used, use of the left radial artery in addition to the left mammary artery and saphenous vein.  I discussed the vessels to be bypassed.  He understands the expected hospital stay and overall recovery as well as an excellent chance of procedural success.  He does understand that this is particularly to even not curative and that he will need to improve his lifestyle to improve his chances of survival, but does always have possibility of needing future interventions from a cardiac standpoint.  I discussed in detail with the patient and his family the indications, risks, benefits, and alternatives.  They understand the risks include, but are not limited to death, stroke, myocardial infarction, deep venous thrombosis, pulmonary embolism, bleeding, possible need for transfusions, infections, respiratory or renal failure, gastrointestinal complications including ulcer, bleeding or perforation as well as procedural complications, they do understand that the arrhythmias are relatively common with the perioperative period and that could prolong his hospital stay.  He understands and accepts these risks and agrees to proceed.  We will tentatively plan for surgery on Wednesday, May 12, 2011.  We will ask GI to see him, make recommendations regarding management of ulcer prophylaxis as well as advice on platelet inhibitor whether baby aspirin or Plavix might be preferable.  Thank you for much.     Salvatore Decent Dorris Fetch, M.D.     SCH/MEDQ  D:  05/10/2011  T:  05/11/2011  Job:  [id number]  cc:   Ricki Rodriguez, M.D.  Electronically Signed by Charlett Lango M.D. on 05/13/2011 11:43:26 AM

## 2011-05-14 ENCOUNTER — Inpatient Hospital Stay (HOSPITAL_COMMUNITY): Payer: Medicare Other

## 2011-05-14 LAB — BASIC METABOLIC PANEL
BUN: 12 mg/dL (ref 6–23)
CO2: 26 mEq/L (ref 19–32)
Chloride: 98 mEq/L (ref 96–112)
Creatinine, Ser: 0.95 mg/dL (ref 0.50–1.35)
Glucose, Bld: 134 mg/dL — ABNORMAL HIGH (ref 70–99)

## 2011-05-14 LAB — CBC
HCT: 33.3 % — ABNORMAL LOW (ref 39.0–52.0)
Hemoglobin: 11.5 g/dL — ABNORMAL LOW (ref 13.0–17.0)
MCV: 95.1 fL (ref 78.0–100.0)
RBC: 3.5 MIL/uL — ABNORMAL LOW (ref 4.22–5.81)
WBC: 13.5 10*3/uL — ABNORMAL HIGH (ref 4.0–10.5)

## 2011-05-14 LAB — GLUCOSE, CAPILLARY
Glucose-Capillary: 129 mg/dL — ABNORMAL HIGH (ref 70–99)
Glucose-Capillary: 122 mg/dL — ABNORMAL HIGH (ref 70–99)

## 2011-05-15 ENCOUNTER — Other Ambulatory Visit: Payer: Self-pay

## 2011-05-15 ENCOUNTER — Inpatient Hospital Stay (HOSPITAL_COMMUNITY): Payer: Medicare Other

## 2011-05-15 LAB — GLUCOSE, CAPILLARY: Glucose-Capillary: 108 mg/dL — ABNORMAL HIGH (ref 70–99)

## 2011-05-15 LAB — CBC
Hemoglobin: 11.2 g/dL — ABNORMAL LOW (ref 13.0–17.0)
MCH: 32.1 pg (ref 26.0–34.0)
MCHC: 33.7 g/dL (ref 30.0–36.0)
Platelets: 209 10*3/uL (ref 150–400)

## 2011-05-15 LAB — BASIC METABOLIC PANEL
BUN: 17 mg/dL (ref 6–23)
Calcium: 9.1 mg/dL (ref 8.4–10.5)
GFR calc non Af Amer: >90 mL/min (ref >90)
Glucose, Bld: 108 mg/dL — ABNORMAL HIGH (ref 70–99)

## 2011-05-15 MED ORDER — ISOSORBIDE MONONITRATE ER 30 MG PO TB24
30.0000 mg | ORAL_TABLET | Freq: Every day | ORAL | Status: DC
  Administered 2011-05-16 – 2011-05-17 (×2): 30 mg via ORAL
  Filled 2011-05-15 (×3): qty 1

## 2011-05-15 MED ORDER — INSULIN ASPART 100 UNIT/ML ~~LOC~~ SOLN
0.0000 [IU] | Freq: Three times a day (TID) | SUBCUTANEOUS | Status: DC
  Filled 2011-05-15: qty 3

## 2011-05-15 MED ORDER — ZOLPIDEM TARTRATE 5 MG PO TABS: 5.0000 mg | ORAL_TABLET | Freq: Every evening | ORAL | Status: DC | PRN

## 2011-05-15 MED ORDER — METOPROLOL TARTRATE 25 MG PO TABS
25.0000 mg | ORAL_TABLET | Freq: Two times a day (BID) | ORAL | Status: DC
  Administered 2011-05-15 – 2011-05-17 (×4): 25 mg via ORAL
  Filled 2011-05-15 (×6): qty 1

## 2011-05-15 MED ORDER — ONDANSETRON HCL 4 MG/2ML IJ SOLN: 4.0000 mg | Freq: Four times a day (QID) | INTRAMUSCULAR | Status: DC | PRN

## 2011-05-15 MED ORDER — MAGNESIUM HYDROXIDE 400 MG/5ML PO SUSP
30.0000 mL | Freq: Every day | ORAL | Status: DC | PRN
  Administered 2011-05-17: 30 mL via ORAL
  Filled 2011-05-15: qty 30

## 2011-05-15 MED ORDER — SODIUM CHLORIDE 0.9 % IJ SOLN
3.0000 mL | Freq: Two times a day (BID) | INTRAMUSCULAR | Status: DC
  Administered 2011-05-15 – 2011-05-16 (×3): 3 mL via INTRAVENOUS

## 2011-05-15 MED ORDER — ENOXAPARIN SODIUM 40 MG/0.4ML ~~LOC~~ SOLN
40.0000 mg | Freq: Every day | SUBCUTANEOUS | Status: DC
  Administered 2011-05-15 – 2011-05-16 (×2): 40 mg via SUBCUTANEOUS
  Filled 2011-05-15 (×3): qty 0.4

## 2011-05-15 MED ORDER — BISACODYL 10 MG RE SUPP
10.0000 mg | Freq: Every day | RECTAL | Status: DC
  Filled 2011-05-15: qty 1

## 2011-05-15 MED ORDER — LORAZEPAM 1 MG PO TABS
1.0000 mg | ORAL_TABLET | Freq: Three times a day (TID) | ORAL | Status: DC
  Administered 2011-05-15 – 2011-05-17 (×5): 1 mg via ORAL

## 2011-05-15 MED ORDER — POTASSIUM CHLORIDE CRYS ER 20 MEQ PO TBCR: 40.0000 meq | EXTENDED_RELEASE_TABLET | Freq: Every day | ORAL | Status: AC

## 2011-05-15 MED ORDER — CLOPIDOGREL BISULFATE 75 MG PO TABS
75.0000 mg | ORAL_TABLET | Freq: Every day | ORAL | Status: DC
  Administered 2011-05-16 – 2011-05-17 (×2): 75 mg via ORAL
  Filled 2011-05-15 (×5): qty 1

## 2011-05-15 MED ORDER — GUAIFENESIN ER 600 MG PO TB12
1200.0000 mg | ORAL_TABLET | Freq: Two times a day (BID) | ORAL | Status: DC
  Administered 2011-05-16 – 2011-05-17 (×3): 1200 mg via ORAL
  Filled 2011-05-15 (×6): qty 2

## 2011-05-15 MED ORDER — OXYCODONE HCL 5 MG PO TABS
5.0000 mg | ORAL_TABLET | ORAL | Status: DC | PRN
  Administered 2011-05-16: 15 mg via ORAL
  Administered 2011-05-16: 10 mg via ORAL
  Administered 2011-05-17: 15 mg via ORAL

## 2011-05-15 MED ORDER — SIMVASTATIN 40 MG PO TABS
40.0000 mg | ORAL_TABLET | Freq: Every day | ORAL | Status: DC
  Administered 2011-05-16: 40 mg via ORAL
  Filled 2011-05-15 (×3): qty 1

## 2011-05-15 MED ORDER — ACETAMINOPHEN 500 MG PO TABS
1000.0000 mg | ORAL_TABLET | Freq: Four times a day (QID) | ORAL | Status: DC
  Administered 2011-05-16 – 2011-05-17 (×6): 1000 mg via ORAL
  Filled 2011-05-15 (×2): qty 2

## 2011-05-15 MED ORDER — PROMETHAZINE HCL 25 MG/ML IJ SOLN: 12.5000 mg | Freq: Four times a day (QID) | INTRAMUSCULAR | Status: DC | PRN

## 2011-05-15 MED ORDER — ACETAMINOPHEN 325 MG PO TABS: 325.0000 mg | ORAL_TABLET | Freq: Four times a day (QID) | ORAL | Status: DC | PRN

## 2011-05-15 MED ORDER — LISINOPRIL 10 MG PO TABS
10.0000 mg | ORAL_TABLET | Freq: Every day | ORAL | Status: DC
  Filled 2011-05-15 (×2): qty 1

## 2011-05-15 MED ORDER — PANTOPRAZOLE SODIUM 40 MG PO TBEC
40.0000 mg | DELAYED_RELEASE_TABLET | Freq: Two times a day (BID) | ORAL | Status: DC
  Administered 2011-05-15 – 2011-05-17 (×4): 40 mg via ORAL

## 2011-05-15 MED ORDER — ALUM & MAG HYDROXIDE-SIMETH 400-400-40 MG/5ML PO SUSP
15.0000 mL | ORAL | Status: DC | PRN
  Filled 2011-05-15: qty 30

## 2011-05-15 MED ORDER — HYDROCOD POLST-CHLORPHEN POLST 10-8 MG/5ML PO LQCR
5.0000 mL | Freq: Two times a day (BID) | ORAL | Status: DC
  Administered 2011-05-16 – 2011-05-17 (×3): 5 mL via ORAL
  Filled 2011-05-15: qty 5

## 2011-05-15 MED ORDER — ONDANSETRON HCL 4 MG PO TABS: 4.0000 mg | ORAL_TABLET | Freq: Four times a day (QID) | ORAL | Status: DC | PRN

## 2011-05-15 MED ORDER — BISACODYL 5 MG PO TBEC
10.0000 mg | DELAYED_RELEASE_TABLET | Freq: Every day | ORAL | Status: DC
  Administered 2011-05-16: 10 mg via ORAL

## 2011-05-15 MED ORDER — DOCUSATE SODIUM 100 MG PO CAPS
200.0000 mg | ORAL_CAPSULE | Freq: Every day | ORAL | Status: DC
  Administered 2011-05-16 – 2011-05-17 (×2): 200 mg via ORAL
  Filled 2011-05-15: qty 2

## 2011-05-15 MED ORDER — FUROSEMIDE 40 MG PO TABS
40.0000 mg | ORAL_TABLET | Freq: Every day | ORAL | Status: AC
  Filled 2011-05-15: qty 1

## 2011-05-16 MED ORDER — POTASSIUM CHLORIDE CRYS ER 10 MEQ PO TBCR
10.0000 meq | EXTENDED_RELEASE_TABLET | Freq: Two times a day (BID) | ORAL | Status: DC
  Administered 2011-05-17: 10 meq via ORAL
  Filled 2011-05-16 (×2): qty 1

## 2011-05-16 MED ORDER — PANTOPRAZOLE SODIUM 40 MG PO TBEC: 40.0000 mg | DELAYED_RELEASE_TABLET | Freq: Two times a day (BID) | ORAL | Status: DC

## 2011-05-16 MED ORDER — ISOSORBIDE MONONITRATE ER 30 MG PO TB24: 30.0000 mg | ORAL_TABLET | Freq: Every day | ORAL | Status: DC

## 2011-05-16 MED ORDER — OXYCODONE HCL 5 MG PO TABS: 5.0000 mg | ORAL_TABLET | ORAL | Status: DC | PRN

## 2011-05-16 MED ORDER — FUROSEMIDE 40 MG PO TABS
40.0000 mg | ORAL_TABLET | Freq: Every day | ORAL | Status: DC
  Administered 2011-05-17: 40 mg via ORAL
  Filled 2011-05-16: qty 1

## 2011-05-16 MED ORDER — CLOPIDOGREL BISULFATE 75 MG PO TABS: 75.0000 mg | ORAL_TABLET | Freq: Every day | ORAL | Status: AC

## 2011-05-16 MED ORDER — METOPROLOL TARTRATE 25 MG PO TABS: 25.0000 mg | ORAL_TABLET | Freq: Two times a day (BID) | ORAL | Status: DC

## 2011-05-16 MED ORDER — LISINOPRIL 20 MG PO TABS
20.0000 mg | ORAL_TABLET | Freq: Every day | ORAL | Status: DC
  Administered 2011-05-17: 20 mg via ORAL
  Filled 2011-05-16: qty 1

## 2011-05-16 MED ORDER — LISINOPRIL 20 MG PO TABS: 20.0000 mg | ORAL_TABLET | Freq: Every day | ORAL | Status: DC

## 2011-05-16 MED ORDER — SIMVASTATIN 40 MG PO TABS: 40.0000 mg | ORAL_TABLET | Freq: Every day | ORAL | Status: DC

## 2011-05-16 NOTE — Discharge Summary (Addendum)
Physician Discharge Summary  Patient ID: Brandon Martin MRN: [id number] DOB/AGE: 1965/12/09 45 y.o.  Admit date: 05/08/2011 Discharge date: 05/17/2011  Admission Diagnoses:  1.  Multivessel CAD 2. S/P Non STEMI. 3. History of tobacco abuse. 4.  History of GI bleed x two (after donation of kidney and overuse of Goody's) 5.  History of right hydrocele (s/p hydrocelectomy).  Discharge Diagnoses:   1.  Multivessel CAD 2. S/P Non STEMI. 3.  History of tobacco abuse. 4.  History of GI bleed x two (after donation of kidney and overuse of Goody's) 5.  History of right hydrocele (s/p hydrocelectomy).  Procedures:  1.  Cardiac Catheterization 2.  Median Sternotomy for CABG x 3 (LIMA to LAD, SVG to OM1, and open left radial artery to PDA) with EVH right thigh and open left radial artery harvest by Dr. Dorris Fetch on 05/12/2011.  Discharged Condition: stable       History of Presenting Illness:  This is a 45 year old Caucasian male with no known previous cardiac risk factors except tobacco abuse, who presented 05/11/2011 to [facility] ER with complaints of chest pain radiating to his left shoulder.  Initial cardiac Enzymes showed the troponin to be 0.28, CKMB 18.7, and relative index 8.1.  Patient ruled in for a non STEMI.  A cardiac catheterization was done and the patient was found to have severe 3 vessel disease (subtotal RCA included).  A cardiothoracic consultation was obtained with Dr. Dorris Fetch for the consideration for coronary bypass grafting surgery.  Potential risks, complications, and benefits were discussed with the patient and he agreed to proceed with surgery.  A GI consult was obtained with Dr. Dulce Sellar, as the patient had a history of GI bleeding (on 2 different occasions-one after a kidney donation and the other after taking multiple Goody's Powder).  The patient was asymptomatic with regard to GI issues on this admission.  Dr. Dulce Sellar recommended to continue Protonix for about  6 weeks and did not feel he needed an endoscopy at this time.  It should also be noted the patient had a carotid duplex US which showed no evidence of significant internal carotid artery stenosis.  The patient underwent a CABG x 3 on 05/12/2011 by Dr. Dorris Fetch.   Brief Hospital Course:  Patient was extubated without difficulty early the evening of surgery.  He remained afebrile and hemodynamically stable.  He was requiring NTG post operatively.  He was started on a beta blocker, which was titrated accordingly and he was able to be weaned off NTG.  Aline, [place], chest tubes, and foley were all removed early in his postoperative course.  Patient did have several episodes of hypoglycemia.  His insulin drip was stopped and he was started on D10W drip.  This was then stopped when glucose normalized.   His glucose continued to be checked for a couple of days post op, but this was then discontinued as the patient's HgA1C is 5.7.  Patient was found to have mild post op anemia.  His H/H went as low as 11/32.4.  His last H/H was 11.2/33.2. Patient was started on Plavix 75 mg po daily, as he presented with a non STEMI.  He was started on Imdur 30 mg po daily, as he had an open left radial artery harvest.  Patient was felt surgically stable for transfer on 05/14/2011.  He continued to progress with cardiac rehab.  He was started on Lisinopril for better blood pressure control as well.  Patient was tolerating  a heart healthy diet and had had a bowel movement.  His EPW were removed on 05/16/2011.  His chest tube sutures will be removed the day of discharge.  Provided he remains afebrile, vital signs stable, and pending morning round evaluation, he will be surgically discharged on Monday November 5,2012.  Consults: Dr. Willis Modena as patient had a history of GI bleeding (gastric ulcer disease vs. gastritis).  Significant Diagnostic Studies:  1.  Echo 05/09/2011 2.  Cardiac Catheteriztaion  05/10/2011   Discharge Exam: Blood pressure 121/73, pulse 76, temperature 97.7 F (36.5 C), temperature source Oral, resp. rate 18, height 5\' 8"  (1.727 m), weight 199 lb 11.2 oz (90.583 kg), SpO2 93.00%.  Physical Exam:  Cardiovascular: RRR, no murmurs, gallops, or rubs.  Pulmonary: Decreased at bases L>R;no rales, wheezes, or rhonchi.  Abdomen: Soft, non tender, bowel sounds present.  Extremities: Trace bilateral lower extremity edema. LUE sensation improved on thumb.  Wounds: Clean and dry. No erythema or signs of infection.   Lab Results:  CBC:  Basename  05/15/11 0630  05/14/11 0415   WBC  11.5*  13.5*   HGB  11.2*  11.5*   HCT  33.2*  33.3*   PLT  209  165    BMET:  Basename  05/15/11 0630  05/14/11 0415   NA  135  134*   K  3.9  4.1   CL  99  98   CO2  25  26   GLUCOSE  108*  134*   BUN  17  12   CREATININE  0.97  0.95        Disposition: Home or Self Care  Discharge Orders    Future Appointments: Provider: Department: Dept Phone: Center:   06/09/2011 12:30 PM Loreli Slot, MD [facility] [phone] [facility]     Future Orders Please Complete By Expires   DG Chest 2 View  06/09/11 07/15/12   Questions: Responses:   Preferred imaging location? External Comment - [address]   Reason for exam: s/p CABG x 3     Current Discharge Medication List    START taking these medications   Details  clopidogrel (PLAVIX) 75 MG tablet Take 1 tablet (75 mg total) by mouth daily with breakfast. Qty: 30 tablet, Refills: 1    isosorbide mononitrate (IMDUR) 30 MG 24 hr tablet Take 1 tablet (30 mg total) by mouth daily. Qty: 30 tablet, Refills: 1    lisinopril (PRINIVIL,ZESTRIL) 20 MG tablet Take 1 tablet (20 mg total) by mouth daily. Qty: 30 tablet, Refills: 1    metoprolol tartrate (LOPRESSOR) 25 MG tablet Take 1 tablet (25 mg total) by mouth 2 (two) times daily. Qty: 60 tablet, Refills: 1    oxyCODONE (OXY IR/ROXICODONE) 5 MG immediate release tablet  Take 1-3 tablets (5-15 mg total) by mouth every 4 (four) hours as needed for pain. Qty: 40 tablet, Refills: 0    simvastatin (ZOCOR) 40 MG tablet Take 1 tablet (40 mg total) by mouth daily at 6 PM. Qty: 30 tablet, Refills: 1       Follow-up Information    Follow up with Loreli Slot, MD on 06/09/2011. (Appointment time is 12 pm)    Contact information:   [address] [place] [address] [phone]       Follow up with Ricki Rodriguez, MD in 2 weeks.   Contact information:   [address] [place] [address] [phone]  Discharge Instructions:  1.  Diet:  Low salt, low fat. 2. Activity:  May walk up steps, may shower, no lifting more than 10 pounds or driving for 3 weeks, continue with breathing exercises daily, and walk daily and increase the frequency and duration as tolerates. 3.  Wound Care:  Use soap and water, contact the office if any wound problems arise [phone]). 4.  Stop smoking:  Patient counseled in hospital.  May contact [phone] for smoking cessation classes.  SignedArdelle Balls 05/16/2011, 3:57 PM  Agree with above

## 2011-05-16 NOTE — Progress Notes (Signed)
EPW's removed per protocol with no arrythmias or complications. Pt tolerated procedure well. Bedrest for ~1-2hrs. Pain meds given pre-procedure. Will continue to monitor.

## 2011-05-16 NOTE — Progress Notes (Signed)
Subjective:  Patient complains of incisional chest pain. Patient denies any anginal chest pain nausea or vomiting denies shortness of breath.  Objective:  Vital Signs in the last 24 hours: Temp:  [98.9 F (37.2 C)] 98.9 F (37.2 C) (11/04 0619) Pulse Rate:  [86] 86  (11/04 0619) Resp:  [20] 20  (11/04 0619) BP: (137)/(85) 137/85 mmHg (11/04 0619) SpO2:  [92 %] 92 % (11/04 0619) Weight:  [90.583 kg (199 lb 11.2 oz)] 199 lb 11.2 oz (90.583 kg) (11/04 0619)  Intake/Output from previous day:   Intake/Output from this shift:    Physical Exam: General appearance: alert and cooperative Neck: no adenopathy, no carotid bruit and no JVD Lungs: diminished breath sounds bibasilar Heart: regular rate and rhythm, S1, S2 normal and S3 present Abdomen: soft, non-tender; bowel sounds normal; no masses,  no organomegaly Extremities: extremities normal, atraumatic, no cyanosis or edema  Lab Results:  Basename 05/15/11 0630 05/14/11 0415  WBC 11.5* 13.5*  HGB 11.2* 11.5*  PLT 209 165    Basename 05/15/11 0630 05/14/11 0415  NA 135 134*  K 3.9 4.1  CL 99 98  CO2 25 26  GLUCOSE 108* 134*  BUN 17 12  CREATININE 0.97 0.95   No results found for this basename: TROPONINI:2,CK,MB:2 in the last 72 hours Hepatic Function Panel No results found for this basename: PROT,ALBUMIN,AST,ALT,ALKPHOS,BILITOT,BILIDIR,IBILI in the last 72 hours No results found for this basename: CHOL in the last 72 hours No results found for this basename: PROTIME in the last 72 hours  Imaging:     Assessment/Plan:  Status post CABG x3 postop day 4 doing well Coronary artery disease status post and true posterior wall MI. Multi-vessel coronary artery disease. Hypertension Tobacco abuse Postop anemia Postop atelectasis Plan Continue present management as per CPT S. Incentive spirometry. Increase ambulation as tolerated    Javonne Dorko N 05/16/2011, 10:11 AM

## 2011-05-16 NOTE — Progress Notes (Signed)
   Subjective: Patient feeling fairly well today.  Still has a productive cough.  Objective: Vital signs in last 24 hours: Patient Vitals for the past 24 hrs:  BP Temp Pulse Resp SpO2 Weight  05/16/11 0619 137/85 mmHg 98.9 F (37.2 C) 86  20  92 % 199 lb 11.2 oz (90.583 kg)     Hemodynamic parameters for last 24 hours:    Intake/Output from previous day:   Intake/Output this shift:    Physical Exam:  Cardiovascular: RRR, no murmurs, gallops, or rubs. Pulmonary: Decreased at bases L>R;no rales, wheezes, or rhonchi. Abdomen: Soft, non tender, bowel sounds present. Extremities: Trace bilateral lower extremity edema.  LUE sensation improved on thumb. Wounds: Clean and dry.  No erythema or signs of infection.  Lab Results: CBC: Basename 05/15/11 0630 05/14/11 0415  WBC 11.5* 13.5*  HGB 11.2* 11.5*  HCT 33.2* 33.3*  PLT 209 165   BMET:  Basename 05/15/11 0630 05/14/11 0415  NA 135 134*  K 3.9 4.1  CL 99 98  CO2 25 26  GLUCOSE 108* 134*  BUN 17 12  CREATININE 0.97 0.95  CALCIUM 9.1 9.2    PT/INR: No results found for this basename: LABPROT,INR in the last 72 hours ABG:  INR: Will add last result for INR, ABG once components are confirmed Will add last 4 CBG results once components are confirmed  Assessment/Plan:  1. CV - S/p MI. Maintaining SR on Lopressor 25 mg p.o. BID.  Will increase Lisinopril to 20 mg p.o. daily for better control of SBP.  Continue with Plavix and Imdur. 2.  Pulmonary - Encourage incentive spirometer, continue Mucinex. 3. Volume Overload - Continues to have good diuresis with Lasix 40 mg daily. 4.  ABL anemia - Last H/H 11.2/33.2. 5. Remove EPW today, CT sutures in AM. 6.  Likely D/C home in am.   Ardelle Balls, PA 05/16/2011

## 2011-05-17 ENCOUNTER — Other Ambulatory Visit: Payer: Self-pay

## 2011-05-17 MED ORDER — BENZONATATE 200 MG PO CAPS: 200.0000 mg | ORAL_CAPSULE | Freq: Three times a day (TID) | ORAL | Status: AC | PRN

## 2011-05-17 MED ORDER — BENZONATATE 100 MG PO CAPS
200.0000 mg | ORAL_CAPSULE | Freq: Three times a day (TID) | ORAL | Status: DC | PRN
  Administered 2011-05-17: 200 mg via ORAL
  Filled 2011-05-17 (×2): qty 2

## 2011-05-17 NOTE — Progress Notes (Signed)
Pt given discharge instructions and discharge medications. Pt verbalized understanding of all instructions including diet, exercise, daily wt, incentive spirometer, and incisional care.

## 2011-05-17 NOTE — Progress Notes (Addendum)
Pt states some stabbing chest pain, after coughing a lot. Vs 148/99 98%2lnc hr 77 nsr with pvc. Pt also had  A rhythmn change earlier while sleep 2nd degree heart blk. EKG performed. WNL . Made Dr. Dorris Fetch aware of above. Orders given. Will continue to monitor. VS 137/88 78 nsr.

## 2011-05-17 NOTE — Progress Notes (Signed)
Subjective:  Feeling better. Ready to go home.   Objective:  Vital Signs in the last 24 hours: Temp:  [97.7 F (36.5 C)-98.6 F (37 C)] 98.6 F (37 C) (11/05 0326) Pulse Rate:  [76-85] 77  (11/05 0326) Resp:  [16-22] 20  (11/05 0326) BP: (121-148)/(73-99) 131/88 mmHg (11/05 0326) SpO2:  [93 %-98 %] 98 % (11/05 0325) Weight:  [90.13 kg (198 lb 11.2 oz)] 198 lb 11.2 oz (90.13 kg) (11/05 0326)  Intake/Output from previous day: 11/04 0701 - 11/05 0700 In: 1160 [P.O.:1160] Out: 450 [Urine:450] Intake/Output from this shift:    Physical Exam: General appearance: alert and no distress Neck: no adenopathy, no carotid bruit, no JVD, supple, symmetrical, trachea midline and thyroid not enlarged, symmetric, no tenderness/mass/nodules Lungs: clear to auscultation bilaterally. Mid line surgical scar is healing Heart: regular rate and rhythm, S1, S2 normal, no murmur, click, rub or gallop Abdomen: soft, non-tender; bowel sounds normal; no masses. Extremities: extremities normal, atraumatic, no cyanosis or edema Pulses: 2+ and symmetric Skin: Skin color, texture, turgor normal. No rashes or lesions Neurologic: Alert and oriented X 3, normal strength and tone. Normal coordination and gait  Lab Results:  Basename 05/15/11 0630  WBC 11.5*  HGB 11.2*  PLT 209    Basename 05/15/11 0630  NA 135  K 3.9  CL 99  CO2 25  GLUCOSE 108*  BUN 17  CREATININE 0.97    Imaging: Imaging results have been reviewed  Cardiac Studies:  Assessment/Plan:  CABG-V45.81 Coronary Artery Disease-414.01 DM, II-250.00  LOS: 9 days    Brandon Martin 05/17/2011, 8:09 AM

## 2011-05-17 NOTE — Progress Notes (Signed)
  Subjective: Still with productive cough, but better after Mucinex started.  No fever.  Ready to go home.CABG  Objective: Vital signs in last 24 hours: Temp:  [97.7 F (36.5 C)-98.6 F (37 C)] 98.6 F (37 C) (11/05 0326) Pulse Rate:  [76-85] 77  (11/05 0326) Cardiac Rhythm:  [-] Normal sinus rhythm (11/04 2000) Resp:  [16-22] 20  (11/05 0326) BP: (121-148)/(73-99) 131/88 mmHg (11/05 0326) SpO2:  [93 %-98 %] 98 % (11/05 0325) Weight:  [198 lb 11.2 oz (90.13 kg)] 198 lb 11.2 oz (90.13 kg) (11/05 0326)  Hemodynamic parameters for last 24 hours:    Intake/Output from previous day: 11/04 0701 - 11/05 0700 In: 1160 [P.O.:1160] Out: 450 [Urine:450] Intake/Output this shift:    General appearance: no distress Heart: regular rate and rhythm, S1, S2 normal, no murmur, click, rub or gallop Lungs: few coarsse BS which clear with cough Extremities: extremities normal, atraumatic, no cyanosis or edema Wound: clean, dry, no erythema  Lab Results:  Basename 05/15/11 0630  WBC 11.5*  HGB 11.2*  HCT 33.2*  PLT 209   BMET:  Basename 05/15/11 0630  NA 135  K 3.9  CL 99  CO2 25  GLUCOSE 108*  BUN 17  CREATININE 0.97  CALCIUM 9.1    PT/INR: No results found for this basename: LABPROT,INR in the last 72 hours ABG    Component Value Date/Time   PHART 7.317* 05/12/2011 1657   HCO3 21.4 05/12/2011 1657   TCO2 23 05/13/2011 1703   ACIDBASEDEF 5.0* 05/12/2011 1657   O2SAT 96.0 05/12/2011 1657   CBG (last 3)   Basename 05/15/11 0752 05/15/11 0511 05/15/11 0020  GLUCAP 108* 104* 128*    Assessment/Plan: S/P CABG, stable and doing well.    PLAN: D/C home today. Pulm- continue Mucinex, pulmonary toilet. CV- BPs better with added ACE-I.  Sinus rhythm.     Plan for discharge: see discharge orders   LOS: 9 days    Janaria Mccammon H 05/17/2011

## 2011-05-17 NOTE — Progress Notes (Signed)
05/17/11 Cardiac Rehab 0825- 0900 Completed discharge education with patient and mother. Reviewed exercise guidelines, sternal precautions, flutter valve, IS, diet. They voice understanding. Pt. Agrees to Outpt. CRP in [place], will send referral.

## 2011-05-21 ENCOUNTER — Other Ambulatory Visit: Payer: Self-pay

## 2011-05-21 ENCOUNTER — Emergency Department (HOSPITAL_COMMUNITY)
Admission: EM | Admit: 2011-05-21 | Discharge: 2011-05-21 | Disposition: A | Discharge status: Home / Self Care | Payer: Medicare Other | Attending: Emergency Medicine | Admitting: Emergency Medicine

## 2011-05-21 ENCOUNTER — Encounter: Payer: Self-pay | Admitting: *Deleted

## 2011-05-21 ENCOUNTER — Emergency Department (HOSPITAL_COMMUNITY): Payer: Medicare Other

## 2011-05-21 DIAGNOSIS — R079 Chest pain, unspecified: Secondary | ICD-10-CM | POA: Insufficient documentation

## 2011-05-21 DIAGNOSIS — R0602 Shortness of breath: Secondary | ICD-10-CM | POA: Insufficient documentation

## 2011-05-21 HISTORY — DX: Hyperlipidemia, unspecified: E78.5

## 2011-05-21 HISTORY — DX: Gastro-esophageal reflux disease without esophagitis: K21.9

## 2011-05-21 HISTORY — DX: Atherosclerotic heart disease of native coronary artery without angina pectoris: I25.10

## 2011-05-21 LAB — POCT I-STAT, CHEM 8
BUN: 28 mg/dL — ABNORMAL HIGH (ref 6–23)
Calcium, Ion: 1.23 mmol/L (ref 1.12–1.32)
Chloride: 104 mEq/L (ref 96–112)
Glucose, Bld: 107 mg/dL — ABNORMAL HIGH (ref 70–99)
TCO2: 27 mmol/L (ref 0–100)

## 2011-05-21 LAB — CBC
HCT: 36.8 % — ABNORMAL LOW (ref 39.0–52.0)
MCH: 32.4 pg (ref 26.0–34.0)
MCV: 94.6 fL (ref 78.0–100.0)
RBC: 3.89 MIL/uL — ABNORMAL LOW (ref 4.22–5.81)
RDW: 12.4 % (ref 11.5–15.5)
WBC: 14 10*3/uL — ABNORMAL HIGH (ref 4.0–10.5)

## 2011-05-21 NOTE — ED Notes (Signed)
No answer, unable to find. 

## 2011-05-21 NOTE — ED Notes (Signed)
Pt reports he woke up this am with sob and wheezing. Pt reports he was recently dx with bronchitis. Reports bilateral chest discomfort. Repots constant coughing. Denies fevers. Reports cough is productive, describes as green.

## 2011-05-21 NOTE — ED Notes (Signed)
Called for exam room, No answer x2, unable to find.

## 2011-05-22 NOTE — Procedures (Signed)
PROCEDURE:  Left heart catheterization with selective coronary angiography, left ventriculogram.  CLINICAL HISTORY:  This is a 45 years old white male with NSTEMI and history of gastrointestinal bleed with aspirin use.  The risks, benefits, and details of the procedure were explained to the patient.  The patient verbalized understanding and wanted to proceed.  Informed written consent was obtained.  PROCEDURE TECHNIQUE:  The patient was approached from the right femoral artery using a 5 French short sheath.  Left coronary angiography was done using a Judkins L4 guide catheter.  Right coronary angiography was done using a Judkins R4 guide catheter.  Left ventriculography was done using a pigtail catheter.    CONTRAST:  Total of 50 cc.  COMPLICATIONS:  None.  At the end of the procedure a pressure device was used for hemostasis.    HEMODYNAMICS:  Aortic pressure was normal; LV pressure was normal; LVEDP 10.  There was no gradient between the left ventricle and aorta.    ANGIOGRAM/CORONARY ARTERIOGRAM:   The left main coronary artery is unremarkable.  The left anterior descending artery has osteal 20 % concentric followed by aneurysmal dilatation. Proximal 70-80 % concentric lesion, mid vessel 80 % then mild diffuse disease. Diagonal 1 and 2 are small vessels. Diagonal 3 is larger vessel. All 3 diagonal vessels and septal perforator with osteal 80-90 % stenosis.  The left circumflex artery has gradual tapering to mid vessel with 50 % stenosis. Ramus is unremarkable. OM 1 has proximal long 50 % stenosis. OM 2 is small and unremarkable.  The right coronary artery has proximal 50 % followed by 30 % and then 90 % stenoses. Mid vessel 80 % stenosis thenunremarkable but TIMI grade 1(slow) flow.  LEFT VENTRICULOGRAM:  Left ventricular angiogram was not done due to single kidney and conserve dye use. EF 50 % with mild inferior hypokinesia by 2-D echocardiogram.   IMPRESSION OF HEART CATHETERIZATION:     1. Normal left main coronary artery. 2. Moderate to severe multivessel native vessel coronary artery disease. 3.  Mild LV systolic dysfunction with 50 % EF.  RECOMMENDATION:   CABG due to restriction to ASA, Plavix, and heparin use. Non-drug eluting stent if GI evaluation with endoscopy shows no stomach ulcer or visible vessel or gastritis   Dr. Ricki Rodriguez

## 2011-05-24 ENCOUNTER — Encounter: Payer: Self-pay | Admitting: *Deleted

## 2011-05-27 ENCOUNTER — Encounter: Payer: Self-pay | Admitting: *Deleted

## 2011-06-07 ENCOUNTER — Other Ambulatory Visit: Payer: Self-pay | Admitting: Thoracic Surgery (Cardiothoracic Vascular Surgery)

## 2011-06-07 DIAGNOSIS — I2510 Atherosclerotic heart disease of native coronary artery without angina pectoris: Secondary | ICD-10-CM

## 2011-06-09 ENCOUNTER — Ambulatory Visit: Payer: Self-pay | Admitting: Thoracic Surgery (Cardiothoracic Vascular Surgery)

## 2011-06-11 ENCOUNTER — Other Ambulatory Visit: Payer: Self-pay | Admitting: Thoracic Surgery (Cardiothoracic Vascular Surgery)

## 2011-06-11 DIAGNOSIS — I2510 Atherosclerotic heart disease of native coronary artery without angina pectoris: Secondary | ICD-10-CM

## 2011-06-14 ENCOUNTER — Encounter: Payer: Self-pay | Admitting: *Deleted

## 2011-06-14 ENCOUNTER — Other Ambulatory Visit: Payer: Self-pay | Admitting: *Deleted

## 2011-06-14 DIAGNOSIS — G8918 Other acute postprocedural pain: Secondary | ICD-10-CM

## 2011-06-14 MED ORDER — HYDROCODONE-ACETAMINOPHEN 7.5-500 MG PO TABS: 1.0000 | ORAL_TABLET | ORAL | Status: DC | PRN

## 2011-06-16 ENCOUNTER — Ambulatory Visit
Admission: RE | Admit: 2011-06-16 | Discharge: 2011-06-16 | Disposition: A | Discharge status: Home / Self Care | Payer: Medicare Other | Source: Ambulatory Visit | Attending: Thoracic Surgery (Cardiothoracic Vascular Surgery) | Admitting: Thoracic Surgery (Cardiothoracic Vascular Surgery)

## 2011-06-16 ENCOUNTER — Ambulatory Visit (INDEPENDENT_AMBULATORY_CARE_PROVIDER_SITE_OTHER): Payer: Self-pay | Admitting: Thoracic Surgery (Cardiothoracic Vascular Surgery)

## 2011-06-16 ENCOUNTER — Encounter: Payer: Self-pay | Admitting: Thoracic Surgery (Cardiothoracic Vascular Surgery)

## 2011-06-16 ENCOUNTER — Encounter: Payer: Medicare Other | Admitting: Thoracic Surgery (Cardiothoracic Vascular Surgery)

## 2011-06-16 ENCOUNTER — Ambulatory Visit: Payer: Self-pay | Admitting: Thoracic Surgery (Cardiothoracic Vascular Surgery)

## 2011-06-16 VITALS — BP 125/78 | HR 74 | Resp 20 | Ht 68.0 in | Wt 198.0 lb

## 2011-06-16 DIAGNOSIS — Z951 Presence of aortocoronary bypass graft: Secondary | ICD-10-CM

## 2011-06-16 DIAGNOSIS — I2510 Atherosclerotic heart disease of native coronary artery without angina pectoris: Secondary | ICD-10-CM

## 2011-06-16 NOTE — Progress Notes (Signed)
HPI: Mr. Kisner is a 45 year old gentleman who had coronary bypass grafting x3 including a left radial artery graft on 05/12/2011. Postoperatively he did not have any significant complications. He now returns for a scheduled postoperative followup visit. He states that he has some mild sternal incisional pain, he still taking pain medication primarily at night for goes to bed, he seldom has to take them during the day. He's not had any anginal-type symptoms or significant shortness of breath. He does complain of some pain at the upper part of his radial artery harvest incision. He did have some minimal drainage from his sternal incision that is resolved.  Current Outpatient Prescriptions  Medication Sig Dispense Refill  . acetaminophen (TYLENOL) 500 MG tablet Take 1,000 mg by mouth every 6 (six) hours as needed. For pain      . ALPRAZolam (XANAX) 0.25 MG tablet Take 0.25 mg by mouth at bedtime as needed.        Marland Kitchen aspirin EC 81 MG tablet Take 81 mg by mouth daily.        . clopidogrel (PLAVIX) 75 MG tablet Take 1 tablet (75 mg total) by mouth daily with breakfast.  30 tablet  1  . HYDROcodone-acetaminophen (LORTAB) 7.5-500 MG per tablet Take 1 tablet by mouth every 4 (four) hours as needed (may take one or two tabs q4-6hrs for pain). 1 or 2 tabs every 4-6 hrs for pain  40 tablet  0  . isosorbide mononitrate (IMDUR) 30 MG 24 hr tablet Take 1 tablet (30 mg total) by mouth daily.  30 tablet  1  . metoprolol tartrate (LOPRESSOR) 25 MG tablet Take 1 tablet (25 mg total) by mouth 2 (two) times daily.  60 tablet  1  . pantoprazole (PROTONIX) 40 MG tablet Take 1 tablet (40 mg total) by mouth 2 (two) times daily. Patient to take for approximately 6 weeks.  60 tablet  1  . simvastatin (ZOCOR) 40 MG tablet Take 1 tablet (40 mg total) by mouth daily at 6 PM.  30 tablet  1     Physical Exam: BP 125/78  Pulse 74  Resp 20  Ht 5\' 8"  (1.727 m)  Wt 198 lb (89.812 kg)  BMI 30.11 kg/m2  SpO2 96% Gen.  well-appearing 45 year old male in no acute distress Neuro intact Cardiac regular rate and rhythm normal S1 and S2 Lungs mildly diminished at bases right greater than left Sternum stable Sternal incision clean dry and intact Radial incision with 4 mm area of fluctuance, less than 2 cc of pus drained when opened Leg incisions healing well  Diagnostic Tests: Chest x-ray shows small bilateral effusions right greater than left  Impression: 45 year old male now about 5 weeks out and coronary bypass grafting x3. Overall is doing well. He does have very small pleural effusions, but there minimally symptomatic, I don't think there is any treatment necessary for that this point in time but I would like to see him back in a month to make sure the is no worsening. He does have a very localized wound infection at the most proximal portion of his radial artery harvest incision this was opened and suture removed . He will clean with peroxide for 3 days on a daily basis and cover until healed.  He would be clear to drive but doesn't currently have a license. Is not to lift any objects greater than 10 pounds for another week and nothing over 20 pounds for 3 weeks. After that his activities are unrestricted.  Plan: I will plan to see him back in 4 weeks with a repeat chest x-ray to followup pleural effusions.  Will probably be able to discontinue his Imdur at that time as well.

## 2011-06-21 ENCOUNTER — Encounter: Payer: Self-pay | Admitting: *Deleted

## 2011-07-15 ENCOUNTER — Other Ambulatory Visit: Payer: Self-pay | Admitting: Thoracic Surgery (Cardiothoracic Vascular Surgery)

## 2011-07-15 DIAGNOSIS — I2510 Atherosclerotic heart disease of native coronary artery without angina pectoris: Secondary | ICD-10-CM

## 2011-07-16 ENCOUNTER — Other Ambulatory Visit: Payer: Self-pay | Admitting: Physician Assistant

## 2011-07-16 DIAGNOSIS — J90 Pleural effusion, not elsewhere classified: Secondary | ICD-10-CM | POA: Insufficient documentation

## 2011-07-16 DIAGNOSIS — I2510 Atherosclerotic heart disease of native coronary artery without angina pectoris: Secondary | ICD-10-CM | POA: Insufficient documentation

## 2011-07-16 DIAGNOSIS — E785 Hyperlipidemia, unspecified: Secondary | ICD-10-CM | POA: Insufficient documentation

## 2011-07-16 DIAGNOSIS — E782 Mixed hyperlipidemia: Secondary | ICD-10-CM | POA: Insufficient documentation

## 2011-07-16 DIAGNOSIS — K219 Gastro-esophageal reflux disease without esophagitis: Secondary | ICD-10-CM | POA: Insufficient documentation

## 2011-07-19 ENCOUNTER — Ambulatory Visit (INDEPENDENT_AMBULATORY_CARE_PROVIDER_SITE_OTHER): Payer: Medicare Other | Admitting: Thoracic Surgery (Cardiothoracic Vascular Surgery)

## 2011-07-19 ENCOUNTER — Ambulatory Visit: Payer: Self-pay | Admitting: Thoracic Surgery (Cardiothoracic Vascular Surgery)

## 2011-07-19 ENCOUNTER — Ambulatory Visit
Admission: RE | Admit: 2011-07-19 | Discharge: 2011-07-19 | Disposition: A | Discharge status: Home / Self Care | Payer: Medicare Other | Source: Ambulatory Visit | Attending: Thoracic Surgery (Cardiothoracic Vascular Surgery) | Admitting: Thoracic Surgery (Cardiothoracic Vascular Surgery)

## 2011-07-19 ENCOUNTER — Other Ambulatory Visit: Payer: Self-pay | Admitting: Physician Assistant

## 2011-07-19 ENCOUNTER — Encounter: Payer: Self-pay | Admitting: Thoracic Surgery (Cardiothoracic Vascular Surgery)

## 2011-07-19 VITALS — BP 122/80 | HR 76 | Resp 16 | Ht 68.0 in | Wt 200.0 lb

## 2011-07-19 DIAGNOSIS — I2510 Atherosclerotic heart disease of native coronary artery without angina pectoris: Secondary | ICD-10-CM

## 2011-07-19 DIAGNOSIS — Z951 Presence of aortocoronary bypass graft: Secondary | ICD-10-CM

## 2011-07-19 MED ORDER — PREDNISONE (PAK) 10 MG PO TABS: ORAL_TABLET | ORAL | Status: DC

## 2011-07-19 NOTE — Progress Notes (Signed)
HPI:  Brandon Martin is a 46 year old gentleman who had coronary bypass grafting x3 on 05/12/2011 for three-vessel coronary disease and unstable coronary syndrome. We did use a left radial artery in addition to the left mammary and also a saphenous vein graft. His postoperative course was uncomplicated. He returns today for a scheduled followup visit. He has run out of Imdur but otherwise his been taking his medications. He has not had any anginal-type symptoms. The only pain that he complains office right lateral chest wall pain particularly when he rolls onto his right side at night  Current Outpatient Prescriptions  Medication Sig Dispense Refill  . acetaminophen (TYLENOL) 500 MG tablet Take 1,000 mg by mouth every 6 (six) hours as needed. For pain      . aspirin EC 81 MG tablet Take 81 mg by mouth daily.        . clopidogrel (PLAVIX) 75 MG tablet Take 1 tablet (75 mg total) by mouth daily with breakfast.  30 tablet  1  . metoprolol tartrate (LOPRESSOR) 25 MG tablet Take 1 tablet (25 mg total) by mouth 2 (two) times daily.  60 tablet  1  . pantoprazole (PROTONIX) 40 MG tablet Take 1 tablet (40 mg total) by mouth 2 (two) times daily. Patient to take for approximately 6 weeks.  60 tablet  1  . simvastatin (ZOCOR) 40 MG tablet Take 1 tablet (40 mg total) by mouth daily at 6 PM.  30 tablet  1  . ALPRAZolam (XANAX) 0.25 MG tablet Take 0.25 mg by mouth at bedtime as needed.        Marland Kitchen HYDROcodone-acetaminophen (LORTAB) 7.5-500 MG per tablet Take 1 tablet by mouth every 4 (four) hours as needed (may take one or two tabs q4-6hrs for pain). 1 or 2 tabs every 4-6 hrs for pain  40 tablet  0  . predniSONE (STERAPRED UNI-PAK) 10 MG tablet Take 5 tablets by mouth on Day 1, 4 tablets on Day 2, 3 tablets on day 3, 2 tablets on Day 4, and 1 tablet on Day 5  15 tablet  0    Physical Exam BP 122/80  Pulse 76  Resp 16  Ht 5\' 8"  (1.727 m)  Wt 200 lb (90.719 kg)  BMI 30.41 kg/m2  SpO2 95% Gen. overweight 46 year old  male in no acute distress  neuro alert and oriented x3 Lungs clear with equal breath sounds bilaterally Mild tenderness to palpation fourth fifth rib right side lateral chest wall Sternum stable, incision clean dry and intact Left forearm right leg incisions healing well Cardiac regular rate and rhythm normal S1-S2 no rubs or murmurs No peripheral edema  Diagnostic Tests: Chest x-ray shows small bilateral effusions, improved from last chest x-ray  Impression: 46 year old status post coronary bypass grafting about 2 months ago. He is doing well at this point in time. He's not had any recurrent anginal symptoms. He's completed his course of Imdur and no longer needs that medication for the radial graft.  His activities at this point are unrestricted, but did caution him to build and new activities gradually. He may drive.  In regards to the right rib pain, I do not see any evidence of refractoriness chest x-ray, although was not specifically done looking for that, there is no displaced fracture. He does have a small bilateral effusions and could be the right effusion is inflammatory causing some local irritation, although I suspect cyst do some intercostal muscle strain, is worthwhile to give him a trial of steroid  taper to see if that makes any difference. He decided to change cardiologists and has seen Dr. Eldridge Dace with fetal cardiology.   Plan: Prednisone taper, 50 mg to 10 mg over 5 days Gradually resume full activity He will continue to be followed from a cardiology standpoint by Dr. Eldridge Dace I will be happy to see him back at any time in the future that I can be of any further assistance with his care

## 2011-07-20 ENCOUNTER — Other Ambulatory Visit: Payer: Self-pay | Admitting: Physician Assistant

## 2011-08-19 ENCOUNTER — Other Ambulatory Visit: Payer: Self-pay | Admitting: Physician Assistant

## 2012-07-14 IMAGING — CR DG CHEST 2V
2 series · 2 of 2 positions shown · non-contrast
Comparison: Chest x-ray 05/15/2011.

CLINICAL DATA: Cough, wheezing and shortness of breath.

CHEST - 2 VIEW

[w chest pa]
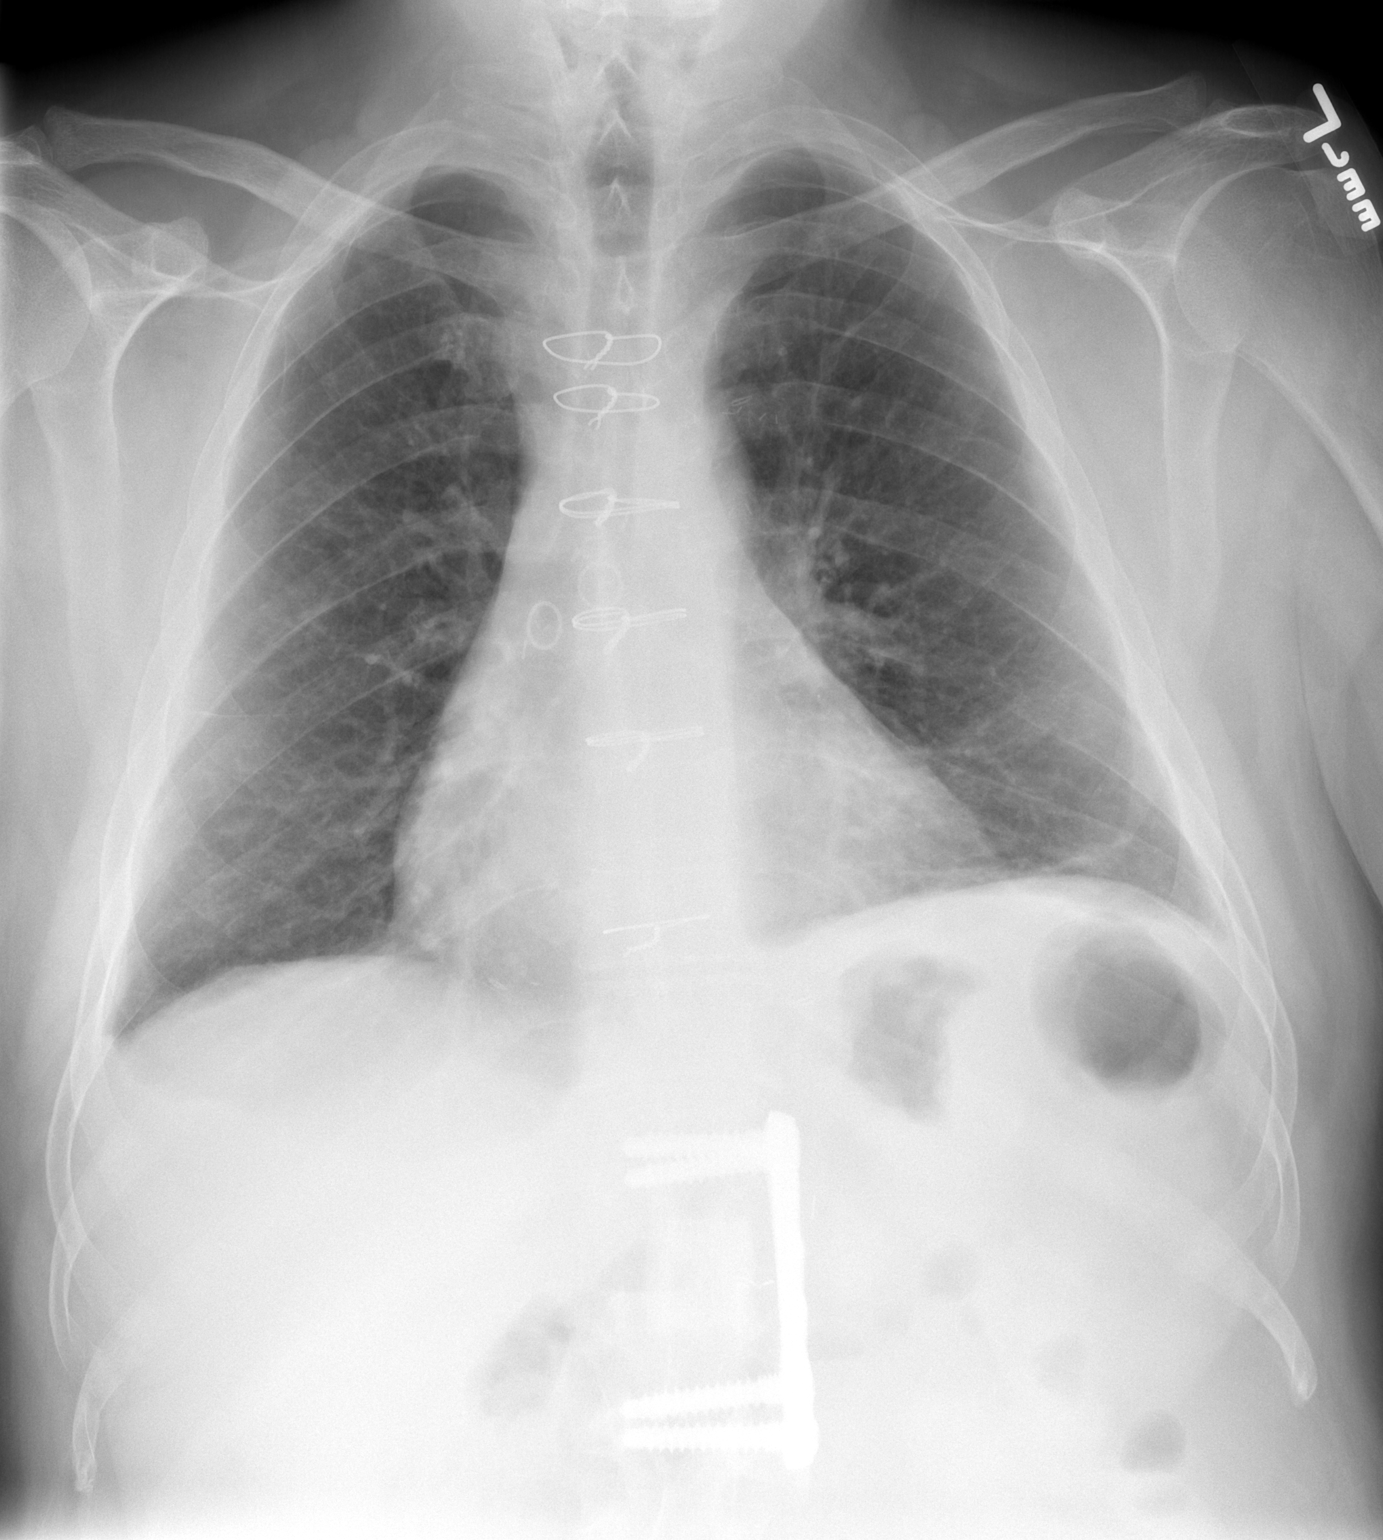

[w chest lat]
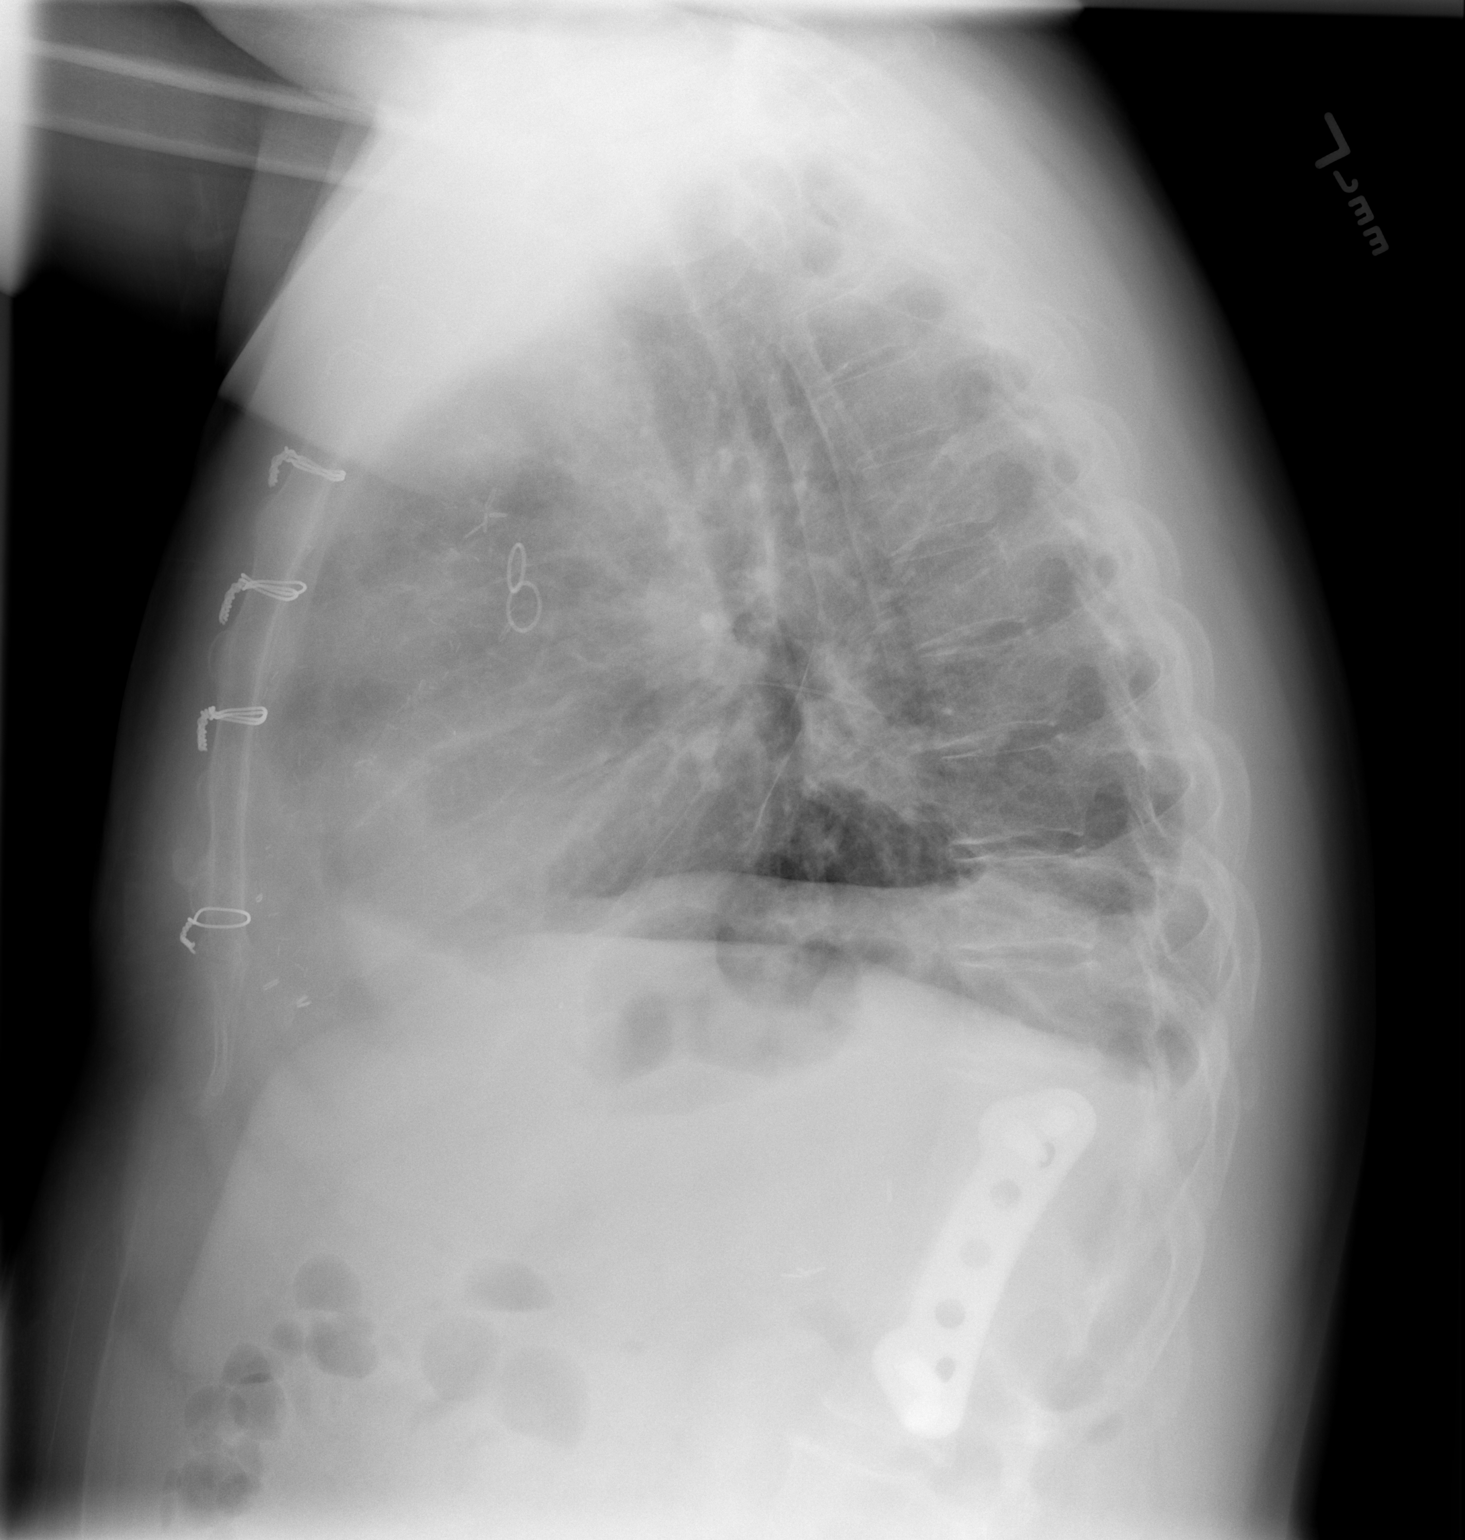

[2 of 2 positions shown; findings below may reference images not displayed]

FINDINGS: The cardiac silhouette, mediastinal and hilar contours
are within normal limits and stable.  Stable surgical changes from
bypass surgery.  Improving lung aeration with minimal residual left
basilar atelectasis.  No pleural effusions.  No pulmonary edema.
The bony thorax is intact.
IMPRESSION: Improved lung aeration with minimal residual left basilar
atelectasis.

## 2012-11-16 ENCOUNTER — Encounter (HOSPITAL_COMMUNITY): Payer: Self-pay | Admitting: Emergency Medicine

## 2012-11-16 ENCOUNTER — Other Ambulatory Visit: Payer: Self-pay

## 2012-11-16 ENCOUNTER — Emergency Department (HOSPITAL_COMMUNITY): Payer: Medicare Other

## 2012-11-16 ENCOUNTER — Observation Stay (HOSPITAL_COMMUNITY)
Admission: EM | Admit: 2012-11-16 | Discharge: 2012-11-17 | Disposition: A | Discharge status: Home / Self Care | Payer: Medicare Other | Attending: Interventional Cardiology | Admitting: Interventional Cardiology

## 2012-11-16 DIAGNOSIS — J90 Pleural effusion, not elsewhere classified: Secondary | ICD-10-CM | POA: Insufficient documentation

## 2012-11-16 DIAGNOSIS — E785 Hyperlipidemia, unspecified: Secondary | ICD-10-CM | POA: Insufficient documentation

## 2012-11-16 DIAGNOSIS — R0789 Other chest pain: Principal | ICD-10-CM | POA: Insufficient documentation

## 2012-11-16 DIAGNOSIS — K219 Gastro-esophageal reflux disease without esophagitis: Secondary | ICD-10-CM | POA: Insufficient documentation

## 2012-11-16 DIAGNOSIS — I209 Angina pectoris, unspecified: Secondary | ICD-10-CM

## 2012-11-16 DIAGNOSIS — Z951 Presence of aortocoronary bypass graft: Secondary | ICD-10-CM | POA: Insufficient documentation

## 2012-11-16 DIAGNOSIS — I2510 Atherosclerotic heart disease of native coronary artery without angina pectoris: Secondary | ICD-10-CM | POA: Insufficient documentation

## 2012-11-16 DIAGNOSIS — I252 Old myocardial infarction: Secondary | ICD-10-CM | POA: Insufficient documentation

## 2012-11-16 DIAGNOSIS — I10 Essential (primary) hypertension: Secondary | ICD-10-CM | POA: Insufficient documentation

## 2012-11-16 DIAGNOSIS — F17200 Nicotine dependence, unspecified, uncomplicated: Secondary | ICD-10-CM | POA: Insufficient documentation

## 2012-11-16 HISTORY — DX: Nicotine dependence, unspecified, uncomplicated: F17.200

## 2012-11-16 HISTORY — DX: Essential (primary) hypertension: I10

## 2012-11-16 MED ORDER — ALBUTEROL SULFATE (5 MG/ML) 0.5% IN NEBU
5.0000 mg | INHALATION_SOLUTION | Freq: Once | RESPIRATORY_TRACT | Status: AC
  Administered 2012-11-17: 5 mg via RESPIRATORY_TRACT
  Filled 2012-11-16: qty 1

## 2012-11-16 NOTE — ED Provider Notes (Signed)
History     CSN: [id number]  Arrival date & time 11/16/12  2329   First MD Initiated Contact with Patient 11/16/12 2338      Chief Complaint  Patient presents with  . Chest Pain    (Consider location/radiation/quality/duration/timing/severity/associated sxs/prior treatment) Patient is a 47 y.o. male presenting with chest pain. The history is provided by the patient. No language interpreter was used.  Chest Pain Pain location:  R chest Pain quality: sharp   Pain radiates to:  L arm Pain radiates to the back: no   Pain severity:  Severe Onset quality:  Sudden Timing:  Constant Progression:  Improving Chronicity:  Recurrent Context: not breathing   Relieved by:  Nitroglycerin Worsened by:  Nothing tried Ineffective treatments:  None tried Associated symptoms: diaphoresis   Associated symptoms: no abdominal pain   Risk factors: coronary artery disease, high cholesterol, male sex and obesity     Past Medical History  Diagnosis Date  . Coronary artery disease   . GERD (gastroesophageal reflux disease)   . Hyperlipidemia   . Pleural effusion   . Hypertension     Past Surgical History  Procedure Laterality Date  . Coronary artery bypass graft  x 3    History reviewed. No pertinent family history.  History  Substance Use Topics  . Smoking status: Current Some Day Smoker    Types: Cigarettes    Last Attempt to Quit: 04/20/2011  . Smokeless tobacco: Former Neurosurgeon  . Alcohol Use: No      Review of Systems  Constitutional: Positive for diaphoresis.  Cardiovascular: Positive for chest pain.  Gastrointestinal: Negative for abdominal pain.  All other systems reviewed and are negative.    Allergies  Aspirin; Morphine and related; and Latex  Home Medications   Current Outpatient Rx  Name  Route  Sig  Dispense  Refill  . acetaminophen (TYLENOL) 500 MG tablet   Oral   Take 1,000 mg by mouth every 6 (six) hours as needed. For pain         . ALPRAZolam  (XANAX) 0.25 MG tablet   Oral   Take 0.25 mg by mouth at bedtime as needed.           Marland Kitchen aspirin EC 81 MG tablet   Oral   Take 81 mg by mouth daily.           Marland Kitchen HYDROcodone-acetaminophen (LORTAB) 7.5-500 MG per tablet   Oral   Take 1 tablet by mouth every 4 (four) hours as needed (may take one or two tabs q4-6hrs for pain). 1 or 2 tabs every 4-6 hrs for pain   40 tablet   0   . EXPIRED: metoprolol tartrate (LOPRESSOR) 25 MG tablet   Oral   Take 1 tablet (25 mg total) by mouth 2 (two) times daily.   60 tablet   1   . EXPIRED: pantoprazole (PROTONIX) 40 MG tablet   Oral   Take 1 tablet (40 mg total) by mouth 2 (two) times daily. Patient to take for approximately 6 weeks.   60 tablet   1   . predniSONE (STERAPRED UNI-PAK) 10 MG tablet      Take 5 tablets by mouth on Day 1, 4 tablets on Day 2, 3 tablets on day 3, 2 tablets on Day 4, and 1 tablet on Day 5   15 tablet   0   . EXPIRED: simvastatin (ZOCOR) 40 MG tablet   Oral   Take 1 tablet (40  mg total) by mouth daily at 6 PM.   30 tablet   1     BP 125/75  Pulse 92  Temp(Src) 98.1 F (36.7 C) (Oral)  Resp 18  SpO2 99%  Physical Exam  Constitutional: He is oriented to person, place, and time. He appears well-developed and well-nourished.  HENT:  Head: Normocephalic and atraumatic.  Mouth/Throat: Oropharynx is clear and moist.  Eyes: Conjunctivae are normal. Pupils are equal, round, and reactive to light.  Neck: Normal range of motion. Neck supple.  Cardiovascular: Normal rate, regular rhythm and intact distal pulses.   Pulmonary/Chest: He has wheezes.  Abdominal: Soft. Bowel sounds are normal. There is no tenderness. There is no rebound and no guarding.  Musculoskeletal: Normal range of motion.  Neurological: He is alert and oriented to person, place, and time.  Skin: Skin is warm and dry. He is not diaphoretic.  Psychiatric: He has a normal mood and affect.    ED Course  Procedures (including critical  care time)  Labs Reviewed - No data to display No results found.   No diagnosis found.    MDM   Date: 11/17/2012  Rate: 93  Rhythm: normal sinus rhythm  QRS Axis: normal  Intervals: normal  ST/T Wave abnormalities: normal  Conduction Disutrbances: none  Narrative Interpretation: r/s early transition          Sx consistent with typical anginal equivalent, start nitrates and heparin to be seen by [facility] cardiology  Machael Raine K Treyvion Durkee-Rasch, MD 11/17/12 [phone]

## 2012-11-16 NOTE — ED Notes (Signed)
[facility] presents with 47 yo male from home with right sided chest pain with radiation to the left arm.  CP just started around 9:30/10:00 pm tonight stated by pt.  Pt. Took 3 NTG at home without relief; [facility] gave 1 NTG and brought pain down to 4/10; No ASA or morphine given/allergic to these meds.

## 2012-11-17 ENCOUNTER — Other Ambulatory Visit: Payer: Self-pay

## 2012-11-17 ENCOUNTER — Observation Stay (HOSPITAL_COMMUNITY): Payer: Medicare Other

## 2012-11-17 ENCOUNTER — Encounter (HOSPITAL_COMMUNITY): Payer: Self-pay | Admitting: Interventional Cardiology

## 2012-11-17 DIAGNOSIS — R079 Chest pain, unspecified: Secondary | ICD-10-CM

## 2012-11-17 DIAGNOSIS — F17200 Nicotine dependence, unspecified, uncomplicated: Secondary | ICD-10-CM | POA: Insufficient documentation

## 2012-11-17 LAB — CBC WITH DIFFERENTIAL/PLATELET
Basophils Absolute: 0.1 10*3/uL (ref 0.0–0.1)
Basophils Relative: 1 % (ref 0–1)
Eosinophils Absolute: 0.3 10*3/uL (ref 0.0–0.7)
Hemoglobin: 16 g/dL (ref 13.0–17.0)
MCH: 32.3 pg (ref 26.0–34.0)
MCHC: 36 g/dL (ref 30.0–36.0)
Neutro Abs: 7.1 10*3/uL (ref 1.7–7.7)
Neutrophils Relative %: 57 % (ref 43–77)
Platelets: 261 10*3/uL (ref 150–400)
RDW: 12.6 % (ref 11.5–15.5)

## 2012-11-17 LAB — LIPID PANEL
LDL Cholesterol: 82 mg/dL (ref 0–99)
Triglycerides: 247 mg/dL — ABNORMAL HIGH (ref <150)

## 2012-11-17 LAB — POCT I-STAT, CHEM 8
Chloride: 107 mEq/L (ref 96–112)
Glucose, Bld: 97 mg/dL (ref 70–99)
HCT: 47 % (ref 39.0–52.0)
Hemoglobin: 16 g/dL (ref 13.0–17.0)
Potassium: 3.8 mEq/L (ref 3.5–5.1)
Sodium: 140 mEq/L (ref 135–145)

## 2012-11-17 LAB — HEMOGLOBIN A1C
Hgb A1c MFr Bld: 5.8 % — ABNORMAL HIGH
Mean Plasma Glucose: 120 mg/dL — ABNORMAL HIGH

## 2012-11-17 LAB — CBC
HCT: 41.5 % (ref 39.0–52.0)
Hemoglobin: 14.7 g/dL (ref 13.0–17.0)
MCH: 31.8 pg (ref 26.0–34.0)
MCHC: 35.4 g/dL (ref 30.0–36.0)
MCV: 89.8 fL (ref 78.0–100.0)
Platelets: 206 K/uL (ref 150–400)
RBC: 4.62 MIL/uL (ref 4.22–5.81)
RDW: 12.8 % (ref 11.5–15.5)
WBC: 9.6 K/uL (ref 4.0–10.5)

## 2012-11-17 LAB — MAGNESIUM: Magnesium: 2.1 mg/dL (ref 1.5–2.5)

## 2012-11-17 LAB — BASIC METABOLIC PANEL
Calcium: 8.7 mg/dL (ref 8.4–10.5)
GFR calc Af Amer: >90 mL/min (ref >90)
GFR calc non Af Amer: 83 mL/min — ABNORMAL LOW (ref >90)
Glucose, Bld: 114 mg/dL — ABNORMAL HIGH (ref 70–99)
Potassium: 4 mEq/L (ref 3.5–5.1)
Sodium: 137 mEq/L (ref 135–145)

## 2012-11-17 LAB — PROTIME-INR
INR: 1.03 (ref 0.00–1.49)
Prothrombin Time: 13.4 s (ref 11.6–15.2)

## 2012-11-17 LAB — TROPONIN I: Troponin I: <0.3 ng/mL

## 2012-11-17 LAB — POCT I-STAT TROPONIN I: Troponin i, poc: 0.01 ng/mL (ref 0.00–0.08)

## 2012-11-17 LAB — TSH: TSH: 2.077 u[IU]/mL (ref 0.350–4.500)

## 2012-11-17 LAB — HEPARIN LEVEL (UNFRACTIONATED): Heparin Unfractionated: <0.1 [IU]/mL — ABNORMAL LOW (ref 0.30–0.70)

## 2012-11-17 MED ORDER — ONDANSETRON HCL 4 MG/2ML IJ SOLN: 4.0000 mg | Freq: Four times a day (QID) | INTRAMUSCULAR | Status: DC | PRN

## 2012-11-17 MED ORDER — TECHNETIUM TC 99M SESTAMIBI GENERIC - CARDIOLITE
10.0000 | Freq: Once | INTRAVENOUS | Status: AC | PRN
  Administered 2012-11-17: 10 via INTRAVENOUS

## 2012-11-17 MED ORDER — ASPIRIN EC 81 MG PO TBEC: 81.0000 mg | DELAYED_RELEASE_TABLET | Freq: Every day | ORAL | Status: DC

## 2012-11-17 MED ORDER — ACETAMINOPHEN 325 MG PO TABS
650.0000 mg | ORAL_TABLET | ORAL | Status: DC | PRN
  Administered 2012-11-17: 650 mg via ORAL
  Filled 2012-11-17: qty 2

## 2012-11-17 MED ORDER — TECHNETIUM TC 99M SESTAMIBI GENERIC - CARDIOLITE
30.0000 | Freq: Once | INTRAVENOUS | Status: AC | PRN
  Administered 2012-11-17: 30 via INTRAVENOUS

## 2012-11-17 MED ORDER — NITROGLYCERIN 0.4 MG SL SUBL: 0.4000 mg | SUBLINGUAL_TABLET | SUBLINGUAL | Status: DC | PRN

## 2012-11-17 MED ORDER — METOPROLOL TARTRATE 25 MG PO TABS
25.0000 mg | ORAL_TABLET | Freq: Two times a day (BID) | ORAL | Status: DC
  Administered 2012-11-17: 25 mg via ORAL
  Filled 2012-11-17 (×2): qty 1

## 2012-11-17 MED ORDER — SODIUM CHLORIDE 0.9 % IV SOLN
INTRAVENOUS | Status: AC
  Administered 2012-11-17: 04:00:00 via INTRAVENOUS

## 2012-11-17 MED ORDER — PANTOPRAZOLE SODIUM 40 MG PO TBEC
40.0000 mg | DELAYED_RELEASE_TABLET | Freq: Three times a day (TID) | ORAL | Status: DC
  Administered 2012-11-17: 40 mg via ORAL
  Filled 2012-11-17: qty 1

## 2012-11-17 MED ORDER — ATORVASTATIN CALCIUM 40 MG PO TABS
40.0000 mg | ORAL_TABLET | Freq: Every day | ORAL | Status: DC
  Administered 2012-11-17: 40 mg via ORAL
  Filled 2012-11-17: qty 1

## 2012-11-17 MED ORDER — REGADENOSON 0.4 MG/5ML IV SOLN
INTRAVENOUS | Status: AC
  Filled 2012-11-17: qty 5

## 2012-11-17 MED ORDER — NITROGLYCERIN 2 % TD OINT
1.0000 [in_us] | TOPICAL_OINTMENT | Freq: Once | TRANSDERMAL | Status: AC
  Administered 2012-11-17: 0.5 [in_us] via TOPICAL
  Filled 2012-11-17: qty 1

## 2012-11-17 MED ORDER — ACETAMINOPHEN 325 MG PO TABS
650.0000 mg | ORAL_TABLET | Freq: Once | ORAL | Status: AC
  Administered 2012-11-17: 650 mg via ORAL
  Filled 2012-11-17: qty 2

## 2012-11-17 MED ORDER — HEPARIN (PORCINE) IN NACL 100-0.45 UNIT/ML-% IJ SOLN
1150.0000 [IU]/h | INTRAMUSCULAR | Status: DC
  Administered 2012-11-17: 1150 [IU]/h via INTRAVENOUS
  Filled 2012-11-17 (×2): qty 250

## 2012-11-17 MED ORDER — HEPARIN BOLUS VIA INFUSION
4000.0000 [IU] | Freq: Once | INTRAVENOUS | Status: AC
  Administered 2012-11-17 (×2): 4000 [IU] via INTRAVENOUS

## 2012-11-17 MED ORDER — REGADENOSON 0.4 MG/5ML IV SOLN
0.4000 mg | Freq: Once | INTRAVENOUS | Status: AC
  Administered 2012-11-17: 0.4 mg via INTRAVENOUS

## 2012-11-17 NOTE — Discharge Instructions (Signed)
Increase activity as tolerated.  Use new nitroglycerin when needed.  Rx sent in already.

## 2012-11-17 NOTE — H&P (Signed)
Cardiology History and Physical  MEYERS, STEPHEN, MD  History of Present Illness (and review of medical records): Brandon Martin is a 47 y.o. male who presents for evaluation of chest pain.  He is followed by Dr. Eldridge Dace whom he last saw 3wks ago for known CAD s/p MI with severe CAD s/p CABG 2012 LIMA-LAD, Left Rad-PDA, SVG-OM1, and HLD.  He had been doing well until today when he developed right sided chest pain around 930pm. He used Actuary today which was more activity than he is used to. This pain radiated to left side and to left shoulder.  Pain was 8/10.  He took 3 NTG with no relief.  He then called EMS.  He was given nitro paste.  He declined ASA due to hx of peptic ulcer.  He is now currently chest pain free on my evaluation.  Review of Systems Further review of systems was otherwise negative other than stated in HPI.  Patient Active Problem List   Diagnosis Date Noted  . Coronary artery disease   . GERD (gastroesophageal reflux disease)   . Hyperlipidemia   . Pleural effusion    Past Medical History  Diagnosis Date  . Coronary artery disease   . GERD (gastroesophageal reflux disease)   . Hyperlipidemia   . Pleural effusion   . Hypertension     Past Surgical History  Procedure Laterality Date  . Coronary artery bypass graft  x 3     (Not in a hospital admission) Allergies  Allergen Reactions  . Aspirin Other (See Comments)    HX of gastric ulcers  . Morphine And Related Nausea And Vomiting  . Latex Rash    History  Substance Use Topics  . Smoking status: Current Some Day Smoker    Types: Cigarettes    Last Attempt to Quit: 04/20/2011  . Smokeless tobacco: Former Neurosurgeon  . Alcohol Use: No    History reviewed. No pertinent family history.   Objective: Patient Vitals for the past 8 hrs:  BP Temp Temp src Pulse Resp SpO2 Height Weight  11/17/12 0224 124/87 mmHg - - 96 14 98 % - -  11/17/12 0215 124/87 mmHg - - 101 16 98 % - -  11/17/12 0200 144/82 mmHg -  - 96 18 99 % - -  11/17/12 0145 128/78 mmHg - - 94 21 96 % - -  11/17/12 0130 129/75 mmHg - - 96 17 99 % - -  11/17/12 0116 125/75 mmHg - - 96 18 97 % - -  11/17/12 0115 125/75 mmHg - - - - - - -  11/17/12 0100 126/68 mmHg - - 95 19 98 % 5\' 8"  (1.727 m) 90.719 kg (200 lb)  11/17/12 0045 115/72 mmHg - - 93 16 97 % - -  11/17/12 0033 129/67 mmHg - - 89 20 93 % - -  11/17/12 0023 - - - - - 98 % - -  11/16/12 2333 125/75 mmHg 98.1 F (36.7 C) Oral 92 18 99 % - -  11/16/12 2332 - - - - - 95 % - -   General Appearance:    Alert, cooperative, no distress,  Head:    Normocephalic, without obvious abnormality, atraumatic  Eyes:     Anicteric sclerae  Neck:   Supple  Lungs:     Clear to auscultation bilaterally, respirations unlabored  Heart:    Regular rate and rhythm, S1 and S2 normal, no murmur  Abdomen:  Soft, non-tender, normoactive bowel sounds  Extremities:   Extremities normal, atraumatic, no cyanosis or edema  Pulses:   2+ and symmetric all extremities  Skin:   Well healed sternotomy wound  Neurologic:   No focal deficits. AAO x3   Results for orders placed during the hospital encounter of 11/16/12 (from the past 48 hour(s))  CBC WITH DIFFERENTIAL     Status: Abnormal   Collection Time    11/17/12 12:05 AM      Result Value Range   WBC 12.5 (*) 4.0 - 10.5 K/uL   RBC 4.95  4.22 - 5.81 MIL/uL   Hemoglobin 16.0  13.0 - 17.0 g/dL   HCT 40.9  81.1 - 91.4 %   MCV 89.9  78.0 - 100.0 fL   MCH 32.3  26.0 - 34.0 pg   MCHC 36.0  30.0 - 36.0 g/dL   RDW 78.2  95.6 - 21.3 %   Platelets 261  150 - 400 K/uL   Neutrophils Relative 57  43 - 77 %   Neutro Abs 7.1  1.7 - 7.7 K/uL   Lymphocytes Relative 35  12 - 46 %   Lymphs Abs 4.3 (*) 0.7 - 4.0 K/uL   Monocytes Relative 6  3 - 12 %   Monocytes Absolute 0.7  0.1 - 1.0 K/uL   Eosinophils Relative 2  0 - 5 %   Eosinophils Absolute 0.3  0.0 - 0.7 K/uL   Basophils Relative 1  0 - 1 %   Basophils Absolute 0.1  0.0 - 0.1 K/uL  POCT I-STAT  TROPONIN I     Status: None   Collection Time    11/17/12 12:13 AM      Result Value Range   Troponin i, poc 0.01  0.00 - 0.08 ng/mL   Comment 3            Comment: Due to the release kinetics of cTnI,     a negative result within the first hours     of the onset of symptoms does not rule out     myocardial infarction with certainty.     If myocardial infarction is still suspected,     repeat the test at appropriate intervals.  POCT I-STAT, CHEM 8     Status: None   Collection Time    11/17/12 12:15 AM      Result Value Range   Sodium 140  135 - 145 mEq/L   Potassium 3.8  3.5 - 5.1 mEq/L   Chloride 107  96 - 112 mEq/L   BUN 15  6 - 23 mg/dL   Creatinine, Ser 0.86  0.50 - 1.35 mg/dL   Glucose, Bld 97  70 - 99 mg/dL   Calcium, Ion 5.78  4.69 - 1.23 mmol/L   TCO2 20  0 - 100 mmol/L   Hemoglobin 16.0  13.0 - 17.0 g/dL   HCT 62.9  52.8 - 41.3 %   Dg Chest 2 View  11/17/2012  *RADIOLOGY REPORT*  Clinical Data: 47 year old male with chest pain.  CHEST - 2 VIEW  Comparison: 07/19/2011 and earlier.  Findings: Sequelae of CABG.  Sequelae of thoracolumbar junction corpectomy and fusion.  Chronic small pleural effusions or pleural scarring.  Mildly larger lung volumes.  Cardiac size and mediastinal contours are within normal limits.  Visualized tracheal air column is within normal limits.  No pneumothorax or pulmonary edema.  No consolidation or acute pulmonary opacity.  IMPRESSION: Chronic small pleural effusions or  pleural scarring. No acute cardiopulmonary abnormality.   Original Report Authenticated By: Erskine Speed, M.D.     ECG:  Sinus rhythm no acute ischemic changes  Cath: 2012 ANGIOGRAM/CORONARY ARTERIOGRAM: The left main coronary artery is unremarkable.  The left anterior descending artery has osteal 20 % concentric followed by aneurysmal dilatation. Proximal 70-80 % concentric lesion, mid vessel 80 % then mild diffuse disease. Diagonal 1 and 2 are small vessels. Diagonal 3 is larger  vessel. All 3 diagonal vessels and septal perforator with osteal 80-90 % stenosis.  The left circumflex artery has gradual tapering to mid vessel with 50 % stenosis. Ramus is unremarkable. OM 1 has proximal long 50 % stenosis. OM 2 is small and unremarkable.  The right coronary artery has proximal 50 % followed by 30 % and then 90 % stenoses. Mid vessel 80 % stenosis thenunremarkable but TIMI grade 1(slow) flow.  LEFT VENTRICULOGRAM: Left ventricular angiogram was not done due to single kidney and conserve dye use. EF 50 % with mild inferior hypokinesia by 2-D echocardiogram.   Assessment: 72M hx of CAD s/p CABG p/w chest pain, now resolved after strenuous activity today. Initial biomarkers negative and ecg without significant changes.  Plan:  1. Cardiology Observation on [facility] 2. Continuous monitoring on Telemetry. 3. Repeat ekg on admit, prn chest pain or arrythmia 4. Trend cardiac biomarkers, check lipids, hgba1c, tsh 5. Medical management to include ASA, Heparin gtt,  BB, Statin, NTG prn 6. TTE in am assess LV function and any new wall motion abnormalities. 7. Further work up pending initial results and clinical course.

## 2012-11-17 NOTE — Progress Notes (Signed)
Utilization review completed.  

## 2012-11-17 NOTE — Progress Notes (Signed)
Pt requesting to be DNR status. MD on-call made aware.  Harless Litten, RN 11/17/12

## 2012-11-17 NOTE — Discharge Summary (Signed)
Patient ID: Brandon Martin MRN: [id number] DOB/AGE: May 13, 1966 47 y.o.  Admit date: 11/16/2012 Discharge date: 11/17/2012  Primary Discharge Diagnosis CAD Secondary Discharge Diagnosis atypical chest pain, tobacco abuse  Significant Diagnostic Studies: nuclear medicine: lexiscan cardiolite with evidence of prior inferior infarction, EF 43%  Consults: None  Hospital Course: 47 y/o who had CABG in 2012.  He was pushing his lawnmower and had some chest pain afterwards.  He used some NTG at home without relief.  He came to the hospital.  He had negative enzymes and no further pain while in the hospital.  He had a chemical stress test showing no ischemia.  EF was decreased due to prior infarct.  He walked around and had no problems.  He was deemed ready for discharge.  He had some mild right hand pain at the IV site.   His CP was likely noncardiac and due to muscle strain from using a push mower, that he has not used in 10 years.  He will get his riding mower back.    Discharge Exam: Blood pressure 141/106, pulse 75, temperature 97.8 F (36.6 C), temperature source Oral, resp. rate 17, height 5\' 9"  (1.753 m), weight 93.26 kg (205 lb 9.6 oz), SpO2 97.00%. [place]/AT RRR S1 S2 CTA bilateral Obese No edema  Labs:   Lab Results  Component Value Date   WBC 9.6 11/17/2012   HGB 14.7 11/17/2012   HCT 41.5 11/17/2012   MCV 89.8 11/17/2012   PLT 206 11/17/2012    Recent Labs Lab 11/17/12 0540  NA 137  K 4.0  CL 103  CO2 23  BUN 16  CREATININE 1.05  CALCIUM 8.7  GLUCOSE 114*   Lab Results  Component Value Date   CKTOTAL 629* 05/09/2011   CKMB 49.1* 05/09/2011   TROPONINI <0.30 11/17/2012    Lab Results  Component Value Date   CHOL 160 11/17/2012   CHOL 231* 05/09/2011   Lab Results  Component Value Date   HDL 29* 11/17/2012   HDL 40 57/84/6962   Lab Results  Component Value Date   LDLCALC 82 11/17/2012   LDLCALC 146* 05/09/2011   Lab Results  Component Value Date   TRIG 247* 11/17/2012   TRIG 223* 05/09/2011   Lab Results  Component Value Date   CHOLHDL 5.5 11/17/2012   CHOLHDL 5.8 05/09/2011   No results found for this basename: LDLDIRECT      Radiology: Trace effusions bilaterally EKG:NSR , no ST segment changes  FOLLOW UP PLANS AND APPOINTMENTS    Medication List    TAKE these medications       atorvastatin 40 MG tablet  Commonly known as:  LIPITOR  Take 40 mg by mouth daily.     metoprolol tartrate 25 MG tablet  Commonly known as:  LOPRESSOR  Take 1 tablet (25 mg total) by mouth 2 (two) times daily.     nitroGLYCERIN 0.4 MG SL tablet  Commonly known as:  NITROSTAT  Place 1 tablet (0.4 mg total) under the tongue every 5 (five) minutes as needed for chest pain.     pantoprazole 40 MG tablet  Commonly known as:  PROTONIX  Take 40 mg by mouth 3 (three) times daily.           Follow-up Information   Follow up with Corky Crafts., MD. (as scheduled)    Contact information:   [address] [place] [address] [phone]       BRING ALL  MEDICATIONS WITH YOU TO FOLLOW UP APPOINTMENTS  Time spent with patient to include physician time: 25 minutes Signed: VARANASI,JAYADEEP S. 11/17/2012, 2:51 PM

## 2012-11-17 NOTE — Progress Notes (Signed)
ANTICOAGULATION CONSULT NOTE - Initial Consult  Pharmacy Consult for Heparin Indication: chest pain/ACS  Allergies  Allergen Reactions  . Aspirin Other (See Comments)    HX of gastric ulcers  . Morphine And Related Nausea And Vomiting  . Latex Rash    Patient Measurements: Height: 5\' 8"  (172.7 cm) Weight: 200 lb (90.719 kg) IBW/kg (Calculated) : 68.4 Heparin Dosing Weight: 87 kg  Vital Signs: Temp: 98.1 F (36.7 C) (05/08 2333) Temp src: Oral (05/08 2333) BP: 125/75 mmHg (05/09 0116) Pulse Rate: 96 (05/09 0116)  Labs:  Recent Labs  11/17/12 0005 11/17/12 0015  HGB 16.0 16.0  HCT 44.5 47.0  PLT 261  --   CREATININE  --  1.20    Estimated Creatinine Clearance: 83.2 ml/min (by C-G formula based on Cr of 1.2).   Medical History: Past Medical History  Diagnosis Date  . Coronary artery disease   . GERD (gastroesophageal reflux disease)   . Hyperlipidemia   . Pleural effusion   . Hypertension     Assessment: 39 YOM with chest pain to start IV heparin. Hgb 16, plt 261. Patient is not on any anticoagulants PTA.  Goal of Therapy:  Heparin level 0.3-0.7 units/ml Monitor platelets by anticoagulation protocol: Yes   Plan:  - Heparin bolus 4000 units x 1 - Heparin infusion 1150 units/hr - 6 hr heparin level at 0800 - Daily heparin level and CBC  Bayard Hugger, PharmD, BCPS  Clinical Pharmacist  Pager: [phone]   11/17/2012,1:17 AM

## 2013-04-07 ENCOUNTER — Other Ambulatory Visit: Payer: Self-pay | Admitting: Interventional Cardiology

## 2013-04-07 DIAGNOSIS — Z79899 Other long term (current) drug therapy: Secondary | ICD-10-CM

## 2013-04-07 DIAGNOSIS — E782 Mixed hyperlipidemia: Secondary | ICD-10-CM

## 2013-05-14 ENCOUNTER — Encounter: Payer: Medicare Other | Admitting: *Deleted

## 2016-12-04 ENCOUNTER — Inpatient Hospital Stay (HOSPITAL_COMMUNITY)
Admission: EM | Admit: 2016-12-04 | Discharge: 2016-12-07 | DRG: 065 | Disposition: A | Discharge status: Rehabilitation Facility | Payer: Medicare Other | Attending: Internal Medicine | Admitting: Internal Medicine

## 2016-12-04 ENCOUNTER — Encounter (HOSPITAL_COMMUNITY): Payer: Self-pay | Admitting: Emergency Medicine

## 2016-12-04 ENCOUNTER — Emergency Department (HOSPITAL_COMMUNITY): Payer: Medicare Other

## 2016-12-04 DIAGNOSIS — I10 Essential (primary) hypertension: Secondary | ICD-10-CM | POA: Diagnosis present

## 2016-12-04 DIAGNOSIS — I2510 Atherosclerotic heart disease of native coronary artery without angina pectoris: Secondary | ICD-10-CM | POA: Diagnosis present

## 2016-12-04 DIAGNOSIS — I639 Cerebral infarction, unspecified: Secondary | ICD-10-CM | POA: Diagnosis present

## 2016-12-04 DIAGNOSIS — Z886 Allergy status to analgesic agent status: Secondary | ICD-10-CM | POA: Diagnosis not present

## 2016-12-04 DIAGNOSIS — K259 Gastric ulcer, unspecified as acute or chronic, without hemorrhage or perforation: Secondary | ICD-10-CM | POA: Diagnosis present

## 2016-12-04 DIAGNOSIS — I6381 Other cerebral infarction due to occlusion or stenosis of small artery: Secondary | ICD-10-CM | POA: Diagnosis present

## 2016-12-04 DIAGNOSIS — G8194 Hemiplegia, unspecified affecting left nondominant side: Secondary | ICD-10-CM | POA: Diagnosis present

## 2016-12-04 DIAGNOSIS — E7800 Pure hypercholesterolemia, unspecified: Secondary | ICD-10-CM | POA: Diagnosis not present

## 2016-12-04 DIAGNOSIS — I6789 Other cerebrovascular disease: Secondary | ICD-10-CM | POA: Diagnosis not present

## 2016-12-04 DIAGNOSIS — Z951 Presence of aortocoronary bypass graft: Secondary | ICD-10-CM

## 2016-12-04 DIAGNOSIS — E782 Mixed hyperlipidemia: Secondary | ICD-10-CM | POA: Diagnosis present

## 2016-12-04 DIAGNOSIS — R471 Dysarthria and anarthria: Secondary | ICD-10-CM | POA: Diagnosis present

## 2016-12-04 DIAGNOSIS — E785 Hyperlipidemia, unspecified: Secondary | ICD-10-CM | POA: Diagnosis present

## 2016-12-04 DIAGNOSIS — K219 Gastro-esophageal reflux disease without esophagitis: Secondary | ICD-10-CM | POA: Diagnosis present

## 2016-12-04 DIAGNOSIS — F17210 Nicotine dependence, cigarettes, uncomplicated: Secondary | ICD-10-CM | POA: Diagnosis present

## 2016-12-04 DIAGNOSIS — F17200 Nicotine dependence, unspecified, uncomplicated: Secondary | ICD-10-CM | POA: Diagnosis not present

## 2016-12-04 DIAGNOSIS — I63311 Cerebral infarction due to thrombosis of right middle cerebral artery: Secondary | ICD-10-CM | POA: Diagnosis not present

## 2016-12-04 DIAGNOSIS — R29810 Facial weakness: Secondary | ICD-10-CM | POA: Diagnosis present

## 2016-12-04 DIAGNOSIS — I63511 Cerebral infarction due to unspecified occlusion or stenosis of right middle cerebral artery: Secondary | ICD-10-CM | POA: Diagnosis present

## 2016-12-04 DIAGNOSIS — R2689 Other abnormalities of gait and mobility: Secondary | ICD-10-CM | POA: Diagnosis not present

## 2016-12-04 DIAGNOSIS — Z91040 Latex allergy status: Secondary | ICD-10-CM

## 2016-12-04 DIAGNOSIS — Z7982 Long term (current) use of aspirin: Secondary | ICD-10-CM | POA: Diagnosis not present

## 2016-12-04 LAB — CBC
HCT: 45.9 % (ref 39.0–52.0)
Hemoglobin: 16.1 g/dL (ref 13.0–17.0)
MCH: 31.9 pg (ref 26.0–34.0)
MCHC: 35.1 g/dL (ref 30.0–36.0)
MCV: 90.9 fL (ref 78.0–100.0)
PLATELETS: 239 10*3/uL (ref 150–400)
RBC: 5.05 MIL/uL (ref 4.22–5.81)
RDW: 13 % (ref 11.5–15.5)
WBC: 10.9 10*3/uL — ABNORMAL HIGH (ref 4.0–10.5)

## 2016-12-04 LAB — COMPREHENSIVE METABOLIC PANEL
ALT: 33 U/L (ref 17–63)
ANION GAP: 12 (ref 5–15)
AST: 20 U/L (ref 15–41)
Albumin: 4.2 g/dL (ref 3.5–5.0)
Alkaline Phosphatase: 70 U/L (ref 38–126)
BILIRUBIN TOTAL: 0.7 mg/dL (ref 0.3–1.2)
BUN: 16 mg/dL (ref 6–20)
CO2: 22 mmol/L (ref 22–32)
Calcium: 9.6 mg/dL (ref 8.9–10.3)
Chloride: 104 mmol/L (ref 101–111)
Creatinine, Ser: 0.97 mg/dL (ref 0.61–1.24)
GFR calc Af Amer: >60 mL/min (ref >60)
GFR calc non Af Amer: >60 mL/min (ref >60)
GLUCOSE: 117 mg/dL — AB (ref 65–99)
POTASSIUM: 3.5 mmol/L (ref 3.5–5.1)
SODIUM: 138 mmol/L (ref 135–145)
TOTAL PROTEIN: 7.6 g/dL (ref 6.5–8.1)

## 2016-12-04 LAB — DIFFERENTIAL
Basophils Absolute: 0.1 10*3/uL (ref 0.0–0.1)
Basophils Relative: 1 %
EOS ABS: 0.2 10*3/uL (ref 0.0–0.7)
EOS PCT: 2 %
LYMPHS ABS: 3.8 10*3/uL (ref 0.7–4.0)
Lymphocytes Relative: 35 %
Monocytes Absolute: 0.7 10*3/uL (ref 0.1–1.0)
Monocytes Relative: 7 %
NEUTROS PCT: 55 %
Neutro Abs: 6 10*3/uL (ref 1.7–7.7)

## 2016-12-04 LAB — I-STAT CHEM 8, ED
BUN: 17 mg/dL (ref 6–20)
CALCIUM ION: 1.07 mmol/L — AB (ref 1.15–1.40)
CHLORIDE: 102 mmol/L (ref 101–111)
Creatinine, Ser: 1 mg/dL (ref 0.61–1.24)
Glucose, Bld: 114 mg/dL — ABNORMAL HIGH (ref 65–99)
HCT: 47 % (ref 39.0–52.0)
HEMOGLOBIN: 16 g/dL (ref 13.0–17.0)
Potassium: 3.5 mmol/L (ref 3.5–5.1)
SODIUM: 135 mmol/L (ref 135–145)
TCO2: 25 mmol/L (ref 0–100)

## 2016-12-04 LAB — PROTIME-INR
INR: 0.95
PROTHROMBIN TIME: 12.6 s (ref 11.4–15.2)

## 2016-12-04 LAB — CBG MONITORING, ED: GLUCOSE-CAPILLARY: 112 mg/dL — AB (ref 65–99)

## 2016-12-04 LAB — I-STAT TROPONIN, ED: Troponin i, poc: 0.02 ng/mL (ref 0.00–0.08)

## 2016-12-04 LAB — APTT: aPTT: 29 seconds (ref 24–36)

## 2016-12-04 MED ORDER — BACITRACIN ZINC 500 UNIT/GM EX OINT
TOPICAL_OINTMENT | Freq: Once | CUTANEOUS | Status: AC
  Administered 2016-12-04: 1 via TOPICAL
  Filled 2016-12-04: qty 0.9

## 2016-12-04 NOTE — ED Notes (Signed)
Patient transported to CT 

## 2016-12-04 NOTE — ED Triage Notes (Addendum)
Patient c/o numbness to left side worsening since 10am. Patient has facial droop. Unable to lift left arm. Denies headache.

## 2016-12-04 NOTE — ED Notes (Signed)
Pt has not teeth to chew cracker with, pt was successful sucking cracker down.

## 2016-12-04 NOTE — ED Notes (Signed)
Pt in ct 

## 2016-12-04 NOTE — ED Notes (Signed)
Pt did pass Stroke [place] screen, pt does verbalizes he " has extra tissue in throat  " unsure meaning but documented.

## 2016-12-04 NOTE — ED Provider Notes (Signed)
[facility] DEPT Provider Note   CSN: [id number] Arrival date & time: 12/04/16  2103     History   Chief Complaint No chief complaint on file.   HPI JEHAN BONANO is a 51 y.o. male.  HPI 51 year old male with a history of hypertension, hyperlipidemia, prior MIs who presents to the ED with left-sided hemibody weakness. Patient reports that the symptoms were noticed when he woke up this morning at 10:00 with left arm weakness. This gradually progressed to left leg and left facial weakness. Denies any alleviating or aggravating factors. Denies any other associated symptoms. Denies any other physical complaints.   Past Medical History:  Diagnosis Date  . Coronary artery disease   . GERD (gastroesophageal reflux disease)   . Hyperlipidemia   . Hypertension   . Pleural effusion   . Tobacco use disorder     Patient Active Problem List   Diagnosis Date Noted  . Stroke (cerebrum) ([place]) 12/04/2016  . Tobacco use disorder   . Coronary artery disease   . GERD (gastroesophageal reflux disease)   . Hyperlipidemia   . Pleural effusion     Past Surgical History:  Procedure Laterality Date  . CORONARY ARTERY BYPASS GRAFT  x 3       Home Medications    Prior to Admission medications   Medication Sig Start Date End Date Taking? Authorizing Provider  metoprolol tartrate (LOPRESSOR) 25 MG tablet Take 1 tablet (25 mg total) by mouth 2 (two) times daily. 05/16/11 11/17/12  Nani Skillern, PA-C  nitroGLYCERIN (NITROSTAT) 0.4 MG SL tablet Place 1 tablet (0.4 mg total) under the tongue every 5 (five) minutes as needed for chest pain. Patient not taking: Reported on 12/04/2016 11/17/12   Jettie Booze, MD    Family History Family History  Problem Relation Age of Onset  . Hypertension Mother   . Arthritis Mother   . Cancer Father     Social History Social History  Substance Use Topics  . Smoking status: Current Some Day Smoker    Types: Cigarettes    Last attempt  to quit: 04/20/2011  . Smokeless tobacco: Former Systems developer  . Alcohol use No     Allergies   Aspirin; Morphine and related; and Latex   Review of Systems Review of Systems All other systems are reviewed and are negative for acute change except as noted in the HPI   Physical Exam Updated Vital Signs BP (!) 170/99 (BP Location: Right Arm)   Pulse (!) 105   Temp 98.6 F (37 C)   Resp 19   Ht 5\' 8"  (1.727 m)   Wt 90.7 kg (200 lb)   SpO2 95%   BMI 30.41 kg/m   Physical Exam  Constitutional: He is oriented to person, place, and time. He appears well-developed and well-nourished. No distress.  HENT:  Head: Normocephalic and atraumatic.  Nose: Nose normal.  Eyes: Conjunctivae and EOM are normal. Pupils are equal, round, and reactive to light. Right eye exhibits no discharge. Left eye exhibits no discharge. No scleral icterus.  Neck: Normal range of motion. Neck supple.  Cardiovascular: Normal rate and regular rhythm.  Exam reveals no gallop and no friction rub.   No murmur heard. Pulmonary/Chest: Effort normal and breath sounds normal. No stridor. No respiratory distress. He has no rales.  Abdominal: Soft. He exhibits no distension. There is no tenderness.  Musculoskeletal: He exhibits no edema or tenderness.  Neurological: He is alert and oriented to person, place, and time.  Mental Status: Alert and oriented to person, place, and time. Attention and concentration normal. Speech clear. Recent memory is intact  Cranial Nerves  II Visual Fields: Intact to confrontation. Visual fields intact. III, IV, VI: Pupils equal and reactive to light and near. Full eye movement without nystagmus  V Facial Sensation: Normal. No weakness of masticatory muscles  VII: Left facial droop VIII Auditory Acuity: Grossly normal  IX/X: The uvula is midline; the palate elevates symmetrically  XI: Normal sternocleidomastoid and trapezius strength  XII: The tongue is midline. No atrophy or fasciculations.     Motor System: Muscle Strength: 2 out of 5 strength in left upper extremity, 2 out of 5 strength in left lower extremity. 5 out of 5 strength in her right upper and lower extremities. Muscle Tone: Tone and muscle bulk are normal in the upper and lower extremities.   Reflexes: DTRs: 2+ on left upper and lower extremities. 1+ on the right upper and lower extremities  Coordination:  No tremor.  Sensation: Intact to light touch, and pinprick.  Gait: Deferred   Skin: Skin is warm and dry. No rash noted. He is not diaphoretic. No erythema.  Psychiatric: He has a normal mood and affect.  Vitals reviewed.    ED Treatments / Results  Labs (all labs ordered are listed, but only abnormal results are displayed) Labs Reviewed  CBC - Abnormal; Notable for the following:       Result Value   WBC 10.9 (*)    All other components within normal limits  COMPREHENSIVE METABOLIC PANEL - Abnormal; Notable for the following:    Glucose, Bld 117 (*)    All other components within normal limits  CBG MONITORING, ED - Abnormal; Notable for the following:    Glucose-Capillary 112 (*)    All other components within normal limits  I-STAT CHEM 8, ED - Abnormal; Notable for the following:    Glucose, Bld 114 (*)    Calcium, Ion 1.07 (*)    All other components within normal limits  PROTIME-INR  APTT  DIFFERENTIAL  I-STAT TROPOININ, ED    EKG  EKG Interpretation None       Radiology Ct Head Code Stroke Wo Contrast`  Result Date: 12/04/2016 CLINICAL DATA:  Code stroke. LEFT-sided numbness beginning at 10 a.m., facial droop. History of hypertension, hyperlipidemia. EXAM: CT HEAD WITHOUT CONTRAST TECHNIQUE: Contiguous axial images were obtained from the base of the skull through the vertex without intravenous contrast. COMPARISON:  None. FINDINGS: BRAIN: 5 mm hypodensity RIGHT putaminal (axial 16/32). No intraparenchymal hemorrhage, mass effect nor midline shift. The ventricles and sulci are normal. No  acute large vascular territory infarcts. No abnormal extra-axial fluid collections. Basal cisterns are patent. VASCULAR: Trace calcific atherosclerosis LEFT vertebral artery. SKULL/SOFT TISSUES: No skull fracture. No significant soft tissue swelling. ORBITS/SINUSES: The included ocular globes and orbital contents are normal.Moderate maxillary sinus mucosal thickening. Mastoid air cells are well aerated. OTHER: None. ASPECTS [facility] Stroke Program Early CT Score) - Ganglionic level infarction (caudate, lentiform nuclei, internal capsule, insula, M1-M3 cortex): 6 - Supraganglionic infarction (M4-M6 cortex): 3 Total score (0-10 with 10 being normal): 9 IMPRESSION: 1. Age indeterminate RIGHT basal ganglia lacunar infarct. 2. ASPECTS is 9. Critical Value/emergent results were called by telephone at the time of interpretation on 12/04/2016 at 9:52 pm to Dr. Leonette Monarch, who verbally acknowledged these results. Electronically Signed   By: Elon Alas M.D.   On: 12/04/2016 21:53    Procedures Procedures (including critical care time)  Medications Ordered in ED Medications  bacitracin ointment (not administered)     Initial Impression / Assessment and Plan / ED Course  I have reviewed the triage vital signs and the nursing notes.  Pertinent labs & imaging results that were available during my care of the patient were reviewed by me and considered in my medical decision making (see chart for details).     CT with likely right lacunar ischemia. This coincides with patient's symptoms. No indication for TPA at this time as patient was otherwise and no. Admitted to hospitalist service and neurology to follow.  Final Clinical Impressions(s) / ED Diagnoses   Final diagnoses:  Stroke [facility])      Shalen Petrak, Grayce Sessions, MD 12/04/16 2354

## 2016-12-04 NOTE — ED Notes (Addendum)
Per base line pt can't read. During [facility] pt was told the words and pt repeated them.  Pt's  words are cultural and slur present per family.

## 2016-12-04 NOTE — H&P (Signed)
History and Physical    Brandon Martin:[id number] DOB: February 22, 1966 DOA: 12/04/2016  PCP: Brandon Rua, MD  Patient coming from: Home  Chief Complaint: Weakness  HPI: Brandon Martin is a 51 y.o. male with medical history significant of HTN, CAD s/p CABG X 3, gastric ulcers, HLD who presents for weakness. [name] woke up this morning at about 10am and noticed that his left arm was weak.  As the day progressed he developed left leg weakness and facial droop on the left with some slurring of his speech.  He was at a family members house, one of which was a Engineer, civil (consulting), who advised him to seek medical attention.  With EMS, he was given aspirin to chew.  He notes associated symptoms of numbness, stumbling when trying to walk, blurry vision (prolonged, for a while), shaking of his hands for the last 2-3 days.  He has had swallowing issues since he was in his 34s, and this does not seem worse.  He reports that he knows he is supposed to be on medications for his blood pressure and for high cholesterol, but he stopped taking his medications about 2 years ago because they were too expensive.  He has not seen a primary care provider in about that time as well.    He has a reported allergy to aspirin, but he does not take this due to having gastric ulcers in the past and the aspirin made them worse.  He was previously told by his cardiologist that he should be on an aspirin.  He is a current smoker, about 1/2 ppd.  His dad died of cancer, mom has HTN and arthritis.  No known history of stroke, but sister in room Engineer, maintenance) reports that she has had TIAs in the past.   ED Course: In the ED, he had a CT of the brain which showed a right lacunar infarct.    Review of Systems: As per HPI otherwise 10 point review of systems negative.    Past Medical History:  Diagnosis Date  . Coronary artery disease   . GERD (gastroesophageal reflux disease)   . Hyperlipidemia   . Hypertension   . Pleural effusion   . Tobacco use  disorder     Past Surgical History:  Procedure Laterality Date  . CORONARY ARTERY BYPASS GRAFT  x 3   Reviewed with the patient, current smoking.   reports that he has been smoking Cigarettes.  He has quit using smokeless tobacco. He reports that he does not drink alcohol or use drugs.  Allergies  Allergen Reactions  . Aspirin Other (See Comments)    HX of gastric ulcers  . Morphine And Related Nausea And Vomiting  . Latex Rash   Reviewed and updated with patient.  Family History  Problem Relation Age of Onset  . Hypertension Mother   . Arthritis Mother   . Cancer Father     Medications:  Not currently taking any medications.   Physical Exam: Vitals:   12/04/16 2142 12/04/16 2202 12/04/16 2217 12/04/16 2253  BP:   (!) 169/112 (!) 172/157  Pulse:   (!) 110 81  Resp:   19 20  Temp:  98.6 F (37 C)    TempSrc:      SpO2: 95%  95% 98%  Weight:      Height:        Constitutional: NAD, calm, comfortable, lying in bed Vitals:   12/04/16 2142 12/04/16 2202 12/04/16 2217 12/04/16 2253  BP:   (!) 169/112 (!) 172/157  Pulse:   (!) 110 81  Resp:   19 20  Temp:  98.6 F (37 C)    TempSrc:      SpO2: 95%  95% 98%  Weight:      Height:       Eyes: PERRL, lids and conjunctivae normal, anicteric sclerae, EOMI ENMT: Mucous membranes are moist. Posterior pharynx clear of any exudate or lesions. Neck: normal, supple, no masses, no thyromegaly Respiratory: CTAB, no wheezing, rales Cardiovascular: RR, NR, no murmur noted Abdomen: NT, ND, +BS Musculoskeletal: no clubbing / cyanosis. No joint deformity upper and lower extremities.  Skin: no rashes, lesions, ulcers. Sunburned skin, reddish discoloration due to sun damage Neurologic: Left sided facial droop, sparing forehead.  EOMI, tongue midline. Cranial nerves otherwise intact.  Strength 3/5 in upper and lower extremity on the left.  Loss of patellar reflex on the left.  Downgoing toes bilaterally.   Psychiatric: Normal  judgment and insight. Alert and oriented x 3. Normal mood.    Labs on Admission: I have personally reviewed following labs and imaging studies  CBC:  Recent Labs Lab 12/04/16 2126 12/04/16 2137  WBC 10.9*  --   NEUTROABS 6.0  --   HGB 16.1 16.0  HCT 45.9 47.0  MCV 90.9  --   PLT 239  --    Basic Metabolic Panel:  Recent Labs Lab 12/04/16 2126 12/04/16 2137  NA 138 135  K 3.5 3.5  CL 104 102  CO2 22  --   GLUCOSE 117* 114*  BUN 16 17  CREATININE 0.97 1.00  CALCIUM 9.6  --    GFR: Estimated Creatinine Clearance: 95.6 mL/min (by C-G formula based on SCr of 1 mg/dL). Liver Function Tests:  Recent Labs Lab 12/04/16 2126  AST 20  ALT 33  ALKPHOS 70  BILITOT 0.7  PROT 7.6  ALBUMIN 4.2   No results for input(s): LIPASE, AMYLASE in the last 168 hours. No results for input(s): AMMONIA in the last 168 hours. Coagulation Profile:  Recent Labs Lab 12/04/16 2126  INR 0.95   Cardiac Enzymes: No results for input(s): CKTOTAL, CKMB, CKMBINDEX, TROPONINI in the last 168 hours. BNP (last 3 results) No results for input(s): PROBNP in the last 8760 hours. HbA1C: No results for input(s): HGBA1C in the last 72 hours. CBG:  Recent Labs Lab 12/04/16 2145  GLUCAP 112*   Lipid Profile: No results for input(s): CHOL, HDL, LDLCALC, TRIG, CHOLHDL, LDLDIRECT in the last 72 hours. Thyroid Function Tests: No results for input(s): TSH, T4TOTAL, FREET4, T3FREE, THYROIDAB in the last 72 hours. Anemia Panel: No results for input(s): VITAMINB12, FOLATE, FERRITIN, TIBC, IRON, RETICCTPCT in the last 72 hours. Urine analysis:    Component Value Date/Time   COLORURINE STRAW (A) 05/10/2011 2241   APPEARANCEUR CLEAR 05/10/2011 2241   LABSPEC 1.005 05/10/2011 2241   PHURINE 6.0 05/10/2011 2241   GLUCOSEU NEGATIVE 05/10/2011 2241   HGBUR TRACE (A) 05/10/2011 2241   BILIRUBINUR NEGATIVE 05/10/2011 2241   KETONESUR NEGATIVE 05/10/2011 2241   PROTEINUR NEGATIVE 05/10/2011 2241    UROBILINOGEN 0.2 05/10/2011 2241   NITRITE NEGATIVE 05/10/2011 2241   LEUKOCYTESUR NEGATIVE 05/10/2011 2241    Radiological Exams on Admission: Ct Head Code Stroke Wo Contrast`  Result Date: 12/04/2016 CLINICAL DATA:  Code stroke. LEFT-sided numbness beginning at 10 a.m., facial droop. History of hypertension, hyperlipidemia. EXAM: CT HEAD WITHOUT CONTRAST TECHNIQUE: Contiguous axial images were obtained from the base of the  skull through the vertex without intravenous contrast. COMPARISON:  None. FINDINGS: BRAIN: 5 mm hypodensity RIGHT putaminal (axial 16/32). No intraparenchymal hemorrhage, mass effect nor midline shift. The ventricles and sulci are normal. No acute large vascular territory infarcts. No abnormal extra-axial fluid collections. Basal cisterns are patent. VASCULAR: Trace calcific atherosclerosis LEFT vertebral artery. SKULL/SOFT TISSUES: No skull fracture. No significant soft tissue swelling. ORBITS/SINUSES: The included ocular globes and orbital contents are normal.Moderate maxillary sinus mucosal thickening. Mastoid air cells are well aerated. OTHER: None. ASPECTS [facility] [place] Stroke Program Early CT Score) - Ganglionic level infarction (caudate, lentiform nuclei, internal capsule, insula, M1-M3 cortex): 6 - Supraganglionic infarction (M4-M6 cortex): 3 Total score (0-10 with 10 being normal): 9 IMPRESSION: 1. Age indeterminate RIGHT basal ganglia lacunar infarct. 2. ASPECTS is 9. Critical Value/emergent results were called by telephone at the time of interpretation on 12/04/2016 at 9:52 pm to Dr. Eudelia Bunch, who verbally acknowledged these results. Electronically Signed   By: Awilda Metro M.D.   On: 12/04/2016 21:53    EKG: Independently reviewed. NSR, rbbb.  New right bundle from last EKG in 2014.    Assessment/Plan Stroke (cerebrum), lacunar infarct on the right - Admit to telemetry bed - Allow permissive HTN for 24 - 48 hours - MRI/MRA brain - TTE - Carotid dopplers - Aspirin  daily (I do not think he has a true allergy, only issue is exacerbating gastric ulcers which are old) - added IV pepcid - PT/OT/speech - Check A1C, lipid panel - IVF with NS at 75cc/hr overnight - CXR - Neuro checks q 2 hours - Smoking cessation counseling - Start statin in the AM - Neurology consult, [place] - will see once at [facility].     Coronary artery disease - Currently chest pain free, s/p CABG in the past - Aspirin as noted above - Permissive HTN, but then would benefit from good BP control to prevent cardiac issues - Telemetry - Troponin X 1 neg    Hyperlipidemia - Check lipid panel - Will need a statin    Tobacco use disorder - Counseling briefly provided, needs resources to quit prior to discharge.      DVT prophylaxis: Lovenox Code Status: full Family Communication: Peggy at bedside, sister Disposition Plan: Admit for continued work up of stroke Consults called: Neurology, [place] Admission status: Inpatient, telemetry   Debe Coder MD [facility] Pager [phone]  If 7PM-7AM, please contact night-coverage [email or web address] Password [email or web address]  12/04/2016, 11:22 PM

## 2016-12-05 ENCOUNTER — Inpatient Hospital Stay (HOSPITAL_COMMUNITY): Payer: Medicare Other

## 2016-12-05 ENCOUNTER — Encounter (HOSPITAL_COMMUNITY): Payer: Self-pay

## 2016-12-05 DIAGNOSIS — I639 Cerebral infarction, unspecified: Secondary | ICD-10-CM

## 2016-12-05 DIAGNOSIS — I2510 Atherosclerotic heart disease of native coronary artery without angina pectoris: Secondary | ICD-10-CM

## 2016-12-05 LAB — LIPID PANEL
Cholesterol: 220 mg/dL — ABNORMAL HIGH (ref 0–200)
HDL: 40 mg/dL — ABNORMAL LOW (ref >40)
LDL Cholesterol: 124 mg/dL — ABNORMAL HIGH (ref 0–99)
Total CHOL/HDL Ratio: 5.5 RATIO
Triglycerides: 281 mg/dL — ABNORMAL HIGH (ref <150)
VLDL: 56 mg/dL — ABNORMAL HIGH (ref 0–40)

## 2016-12-05 LAB — CREATININE, SERUM
Creatinine, Ser: 0.93 mg/dL (ref 0.61–1.24)
GFR calc non Af Amer: >60 mL/min (ref >60)

## 2016-12-05 LAB — CBC
HEMATOCRIT: 45.5 % (ref 39.0–52.0)
Hemoglobin: 15.6 g/dL (ref 13.0–17.0)
MCH: 31.2 pg (ref 26.0–34.0)
MCHC: 34.3 g/dL (ref 30.0–36.0)
MCV: 91 fL (ref 78.0–100.0)
Platelets: 217 10*3/uL (ref 150–400)
RBC: 5 MIL/uL (ref 4.22–5.81)
RDW: 13 % (ref 11.5–15.5)
WBC: 8.9 10*3/uL (ref 4.0–10.5)

## 2016-12-05 LAB — GLUCOSE, CAPILLARY
Glucose-Capillary: 122 mg/dL — ABNORMAL HIGH (ref 65–99)
Glucose-Capillary: 128 mg/dL — ABNORMAL HIGH (ref 65–99)

## 2016-12-05 LAB — HIV ANTIBODY (ROUTINE TESTING W REFLEX): HIV SCREEN 4TH GENERATION: NONREACTIVE

## 2016-12-05 MED ORDER — OXYCODONE HCL 5 MG PO TABS
5.0000 mg | ORAL_TABLET | Freq: Once | ORAL | Status: AC
Start: 2016-12-05 — End: 2016-12-05
  Administered 2016-12-05: 5 mg via ORAL
  Filled 2016-12-05: qty 1

## 2016-12-05 MED ORDER — ATORVASTATIN CALCIUM 20 MG PO TABS
20.0000 mg | ORAL_TABLET | Freq: Every day | ORAL | Status: DC
  Administered 2016-12-05 – 2016-12-06 (×2): 20 mg via ORAL
  Filled 2016-12-05 (×2): qty 1

## 2016-12-05 MED ORDER — TRAMADOL HCL 50 MG PO TABS
50.0000 mg | ORAL_TABLET | Freq: Four times a day (QID) | ORAL | Status: DC | PRN
  Administered 2016-12-06 – 2016-12-07 (×2): 50 mg via ORAL
  Filled 2016-12-05 (×2): qty 1

## 2016-12-05 MED ORDER — ACETAMINOPHEN 325 MG PO TABS: 650.0000 mg | ORAL_TABLET | ORAL | Status: DC | PRN

## 2016-12-05 MED ORDER — ASPIRIN 300 MG RE SUPP: 300.0000 mg | Freq: Every day | RECTAL | Status: DC

## 2016-12-05 MED ORDER — SODIUM CHLORIDE 0.9 % IV SOLN
INTRAVENOUS | Status: DC
  Administered 2016-12-05: 01:00:00 via INTRAVENOUS

## 2016-12-05 MED ORDER — ENOXAPARIN SODIUM 40 MG/0.4ML ~~LOC~~ SOLN
40.0000 mg | SUBCUTANEOUS | Status: DC
Start: 2016-12-05 — End: 2016-12-07
  Administered 2016-12-05 – 2016-12-07 (×3): 40 mg via SUBCUTANEOUS
  Filled 2016-12-05 (×3): qty 0.4

## 2016-12-05 MED ORDER — STROKE: EARLY STAGES OF RECOVERY BOOK
Freq: Once | Status: AC
  Administered 2016-12-05: 07:00:00
  Filled 2016-12-05 (×2): qty 1

## 2016-12-05 MED ORDER — FAMOTIDINE 20 MG PO TABS
20.0000 mg | ORAL_TABLET | Freq: Every day | ORAL | Status: DC
  Administered 2016-12-06 – 2016-12-07 (×2): 20 mg via ORAL
  Filled 2016-12-05 (×2): qty 1

## 2016-12-05 MED ORDER — ASPIRIN 325 MG PO TABS
325.0000 mg | ORAL_TABLET | Freq: Every day | ORAL | Status: DC
  Administered 2016-12-05 – 2016-12-06 (×2): 325 mg via ORAL
  Filled 2016-12-05 (×2): qty 1

## 2016-12-05 MED ORDER — SENNOSIDES-DOCUSATE SODIUM 8.6-50 MG PO TABS: 1.0000 | ORAL_TABLET | Freq: Every evening | ORAL | Status: DC | PRN

## 2016-12-05 MED ORDER — ACETAMINOPHEN 160 MG/5ML PO SOLN: 650.0000 mg | ORAL | Status: DC | PRN

## 2016-12-05 MED ORDER — ACETAMINOPHEN 650 MG RE SUPP: 650.0000 mg | RECTAL | Status: DC | PRN

## 2016-12-05 MED ORDER — LORAZEPAM 2 MG/ML IJ SOLN
1.0000 mg | Freq: Once | INTRAMUSCULAR | Status: AC
  Administered 2016-12-05: 1 mg via INTRAVENOUS
  Filled 2016-12-05: qty 1

## 2016-12-05 MED ORDER — ONDANSETRON HCL 4 MG/2ML IJ SOLN
4.0000 mg | Freq: Once | INTRAMUSCULAR | Status: AC
  Administered 2016-12-05: 4 mg via INTRAVENOUS
  Filled 2016-12-05: qty 2

## 2016-12-05 MED ORDER — FAMOTIDINE IN NACL 20-0.9 MG/50ML-% IV SOLN
20.0000 mg | INTRAVENOUS | Status: DC
  Administered 2016-12-05: 20 mg via INTRAVENOUS
  Filled 2016-12-05 (×2): qty 50

## 2016-12-05 NOTE — ED Notes (Signed)
[facility] picking up patient. 

## 2016-12-05 NOTE — Evaluation (Signed)
Physical Therapy Evaluation Patient Details Name: Brandon Martin MRN: [id number] DOB: 1966/04/03 Today's Date: 12/05/2016   History of Present Illness  Pt is a 51 y.o. male with medical history significant for HTN, CAD s/p CABGx3. gastric ulcers, and HLD. He presented to the ED with L sided weakness, L facial droop, and slurring of speech. CT reveals age indeterminate R basal ganglia lacunar infarct.   Clinical Impression  Pt admitted with above diagnosis. Pt currently with functional limitations due to the deficits listed below (see PT Problem List). Patient demonstrates notable loss of balance towards his left. LE strength grossly intact. Poor balance requiring min-mod assist to ambulate safely. No buckling noted, however continues to drift and tilt towards left until corrected by external support. Pt will benefit from skilled PT to increase their independence and safety with mobility to allow discharge to the venue listed below.       Follow Up Recommendations CIR    Equipment Recommendations   (TBD next venue of care)    Recommendations for Other Services Rehab consult     Precautions / Restrictions Precautions Precautions: Fall Restrictions Weight Bearing Restrictions: No      Mobility  Bed Mobility Overal bed mobility: Needs Assistance Bed Mobility: Supine to Sit     Supine to sit: Min guard     General bed mobility comments: Min guard for safety.  Transfers Overall transfer level: Needs assistance Equipment used: 1 person hand held assist Transfers: Sit to/from Stand Sit to Stand: Min assist         General transfer comment: Min assist for boost and balance, leaning to the left. VC for awareness and to regain balance once upright.  Ambulation/Gait Ambulation/Gait assistance: Mod assist Ambulation Distance (Feet): 15 Feet Assistive device: 1 person hand held assist Gait Pattern/deviations: Step-through pattern;Decreased stride length;Decreased weight shift to  right;Ataxic;Narrow base of support;Staggering left Gait velocity: decreased Gait velocity interpretation: <1.8 ft/sec, indicative of risk for recurrent falls General Gait Details: Mostly ambulating with min assist hand held support, but occasionally with left sided loss of balance requiring up to mod assist for recovery. No buckling noted. VC for sequencing, weight shift, awareness, and upright stance. Tolerated some dynamic tasks such as high knees, backwards steps, and turning but required additional assistance with each task.  Stairs            Wheelchair Mobility    Modified Rankin (Stroke Patients Only) Modified Rankin (Stroke Patients Only) Pre-Morbid Rankin Score: No symptoms Modified Rankin: Moderately severe disability     Balance Overall balance assessment: Needs assistance Sitting-balance support: No upper extremity supported;Feet supported Sitting balance-Leahy Scale: Fair (LOB to left but able to self recover)   Postural control: Left lateral lean Standing balance support: Single extremity supported;During functional activity Standing balance-Leahy Scale: Poor Standing balance comment: Reliant on UE support and external assistance.      Tandem Stance - Right Leg: 0 Tandem Stance - Left Leg: 0 Rhomberg - Eyes Opened: 0     High Level Balance Comments: Unable to perform romberg or tandem without physical assistance.             Pertinent Vitals/Pain Pain Assessment: No/denies pain Faces Pain Scale: Hurts even more Pain Location: L UE with PROM Pain Descriptors / Indicators: Sharp Pain Intervention(s): Monitored during session;Repositioned;Limited activity within patient's tolerance    Home Living Family/patient expects to be discharged to:: Private residence Living Arrangements: Parent;Other relatives (brother) Available Help at Discharge: Family;Available 24 hours/day Type of  Home: House Home Access: Stairs to enter Entrance Stairs-Rails:  None Entrance Stairs-Number of Steps: 2 steps down Home Layout: One level Home Equipment: Shower seat      Prior Function Level of Independence: Independent               Hand Dominance   Dominant Hand: Right    Extremity/Trunk Assessment   Upper Extremity Assessment Upper Extremity Assessment: Defer to OT evaluation LUE Deficits / Details: Minimal AROM of shoulder and elbow. Able to complete lap slides with increased time and effort. Hand Brunnstrom level II approaching III and arm level II.    Lower Extremity Assessment Lower Extremity Assessment: LLE deficits/detail LLE Deficits / Details: Strength grossly intact. Pain with knee extension, crepitus noted. LLE Coordination: decreased fine motor (Heel shin WNL)       Communication   Communication:  (slurred speech)  Cognition Arousal/Alertness: Awake/alert Behavior During Therapy: WFL for tasks assessed/performed Overall Cognitive Status: Impaired/Different from baseline Area of Impairment: Safety/judgement                         Safety/Judgement: Decreased awareness of safety     General Comments: Pt with poor safety awareness throughout with decreased understanding of impact of deficits on safety and independence.       General Comments General comments (skin integrity, edema, etc.): Family present during evaluation.    Exercises Other Exercises Other Exercises: Educated pt on lap slides for HEP with L UE. Able to complete with supervision and increased time.    Assessment/Plan    PT Assessment Patient needs continued PT services  PT Problem List Decreased strength;Decreased range of motion;Decreased activity tolerance;Decreased balance;Decreased mobility;Decreased coordination;Decreased knowledge of use of DME;Decreased safety awareness;Decreased knowledge of precautions       PT Treatment Interventions DME instruction;Gait training;Functional mobility training;Therapeutic  activities;Therapeutic exercise;Balance training;Neuromuscular re-education;Patient/family education    PT Goals (Current goals can be found in the Care Plan section)  Acute Rehab PT Goals Patient Stated Goal: Maybe go to rehab, but wants to go home. PT Goal Formulation: With patient Time For Goal Achievement: 12/19/16 Potential to Achieve Goals: Good    Frequency Min 4X/week   Barriers to discharge        Co-evaluation               AM-PAC PT "6 Clicks" Daily Activity  Outcome Measure Difficulty turning over in bed (including adjusting bedclothes, sheets and blankets)?: A Little Difficulty moving from lying on back to sitting on the side of the bed? : A Little Difficulty sitting down on and standing up from a chair with arms (e.g., wheelchair, bedside commode, etc,.)?: A Lot Help needed moving to and from a bed to chair (including a wheelchair)?: A Lot Help needed walking in hospital room?: A Lot Help needed climbing 3-5 steps with a railing? : A Lot 6 Click Score: 14    End of Session Equipment Utilized During Treatment: Gait belt Activity Tolerance: Patient tolerated treatment well Patient left: in bed;with call bell/phone within reach;with bed alarm set;with family/visitor present Nurse Communication: Mobility status PT Visit Diagnosis: Ataxic gait (R26.0);Hemiplegia and hemiparesis Hemiplegia - Right/Left: Left Hemiplegia - dominant/non-dominant: Non-dominant Hemiplegia - caused by: Cerebral infarction    Time: 1610-9604 PT Time Calculation (min) (ACUTE ONLY): 20 min   Charges:   PT Evaluation $PT Eval Moderate Complexity: 1 Procedure     PT G Codes:        Engineer, petroleum  Georgeanne Nim, PT, DPT [phone]   Berton Mount 12/05/2016, 3:35 PM

## 2016-12-05 NOTE — Progress Notes (Signed)
SLP Cancellation Note  Patient Details Name: Brandon Martin MRN: [id number] DOB: [birth year]   Cancelled treatment:       Reason Eval/Treat Not Completed: SLP screened, no needs identified, will sign off. Orders received for bedside swallow evaluation. Per charting pt passed RN swallow screen. No swallow needs identified, SLP will s/o for swallow. Will f/u for cognitive linguistic evaluation.  Deneise Lever, [place], CCC-SLP Speech-Language Pathologist [phone]   Aliene Altes 12/05/2016, 10:51 AM

## 2016-12-05 NOTE — Progress Notes (Signed)
Neurology notified of patient's admission

## 2016-12-05 NOTE — Progress Notes (Signed)
MD notified of patient's arrival at [facility].

## 2016-12-05 NOTE — Progress Notes (Signed)
Rehab Admissions Coordinator Note:  Patient was screened by Cleatrice Burke for appropriateness for an Inpatient Acute Rehab Consult per OT recommendation.   At this time, we are recommending Inpatient Rehab consult. Please place order for rehab consult for possible admission to CIR.  Cleatrice Burke 12/05/2016, 1:29 PM  I can be reached at [phone].

## 2016-12-05 NOTE — Consult Note (Signed)
Referring Physician: Dr. Doyle Askew    Chief Complaint: Left sided weakness  HPI: Brandon Martin is an 51 y.o. male who presents with left hemiparesis and facial droop. Symptoms first noticed in the AM on Saturday, on awakening, with left arm weakness. This progressed to include left leg weakness, left facial droop and slurred speech. Also with associated left sided sensory numbness, blurred vision and stumbling while walking. EMS was called and he was given a chewable ASA.    He has been noncompliant with his medications for BP and hypercholesterolemia due to cost. He has a listed allergy to ASA which he states is due to worsening of gastric ulcers by this medication. He smokes. He has had TIAs previously per a relative. He has a history of CABG.   Stroke risk factors: CAD, HLD, HTN.  Past Medical History:  Diagnosis Date  . Coronary artery disease   . GERD (gastroesophageal reflux disease)   . Hyperlipidemia   . Hypertension   . Pleural effusion   . Tobacco use disorder     Past Surgical History:  Procedure Laterality Date  . CORONARY ARTERY BYPASS GRAFT  x 3    Family History  Problem Relation Age of Onset  . Hypertension Mother   . Arthritis Mother   . Cancer Father    Social History:  reports that he has been smoking Cigarettes.  He has quit using smokeless tobacco. He reports that he does not drink alcohol or use drugs.  Allergies:  Allergies  Allergen Reactions  . Aspirin Other (See Comments)    HX of gastric ulcers  . Morphine And Related Nausea And Vomiting  . Latex Rash    Medications:  Prior to Admission:  Prescriptions Prior to Admission  Medication Sig Dispense Refill Last Dose  . metoprolol tartrate (LOPRESSOR) 25 MG tablet Take 1 tablet (25 mg total) by mouth 2 (two) times daily. 60 tablet 1 11/15/2012 at 2000  . nitroGLYCERIN (NITROSTAT) 0.4 MG SL tablet Place 1 tablet (0.4 mg total) under the tongue every 5 (five) minutes as needed for chest pain. (Patient not  taking: Reported on 12/04/2016) 25 tablet 11 Not Taking at Unknown time   Scheduled: . aspirin  300 mg Rectal Daily   Or  . aspirin  325 mg Oral Daily  . enoxaparin (LOVENOX) injection  40 mg Subcutaneous Q24H    ROS: No vision changes, trouble with speech comprehension, chest pain or limb pain. Other ROS as per HPI.   Physical Examination: Blood pressure (!) 147/88, pulse 81, temperature 97.9 F (36.6 C), resp. rate 18, height _0  (1.727 m), weight 92.6 kg (204 lb 3.2 oz), SpO2 96 %.  HEENT: [place]/AT Lungs: Respirations unlabored. Ext: Warm and well perfused  Neurologic Examination: Mental Status: Alert, oriented, thought content appropriate.  Speech fluent without evidence of aphasia.  Able to follow all commands without difficulty. Dysarthria noted.  Cranial Nerves: II:  Visual fields intact, PERRL III,IV, VI: ptosis not present, EOMI without nystagmus V,VII: Left facial droop, facial temp sensation equal bilaterally VIII: hearing intact to voice IX,X: no hypophonia XI: no gross asymmetry XII: midline tongue extension  Motor: RUE and RLE: 5/5 LUE: 2/5 proximally, 3/5 distally LLE: 4/5 proximal and distal Bulk normal x 4 Sensory: FT intact x 4. No extinction. Hyperesthesia to temperature, left leg Deep Tendon Reflexes:  2+ right biceps, brachioradialis, patella and achilles 1+ left biceps, brachioradialis, patella and achilles Toes equivocal.  Cerebellar: No ataxia with FNF on right. Unable  to perform on left.  Gait: Deferred  Results for orders placed or performed during the hospital encounter of 12/04/16 (from the past 48 hour(s))  Protime-INR     Status: None   Collection Time: 12/04/16  9:26 PM  Result Value Ref Range   Prothrombin Time 12.6 11.4 - 15.2 seconds   INR 0.95   APTT     Status: None   Collection Time: 12/04/16  9:26 PM  Result Value Ref Range   aPTT 29 24 - 36 seconds  CBC     Status: Abnormal   Collection Time: 12/04/16  9:26 PM  Result Value  Ref Range   WBC 10.9 (H) 4.0 - 10.5 K/uL   RBC 5.05 4.22 - 5.81 MIL/uL   Hemoglobin 16.1 13.0 - 17.0 g/dL   HCT 45.9 39.0 - 52.0 %   MCV 90.9 78.0 - 100.0 fL   MCH 31.9 26.0 - 34.0 pg   MCHC 35.1 30.0 - 36.0 g/dL   RDW 13.0 11.5 - 15.5 %   Platelets 239 150 - 400 K/uL  Differential     Status: None   Collection Time: 12/04/16  9:26 PM  Result Value Ref Range   Neutrophils Relative % 55 %   Neutro Abs 6.0 1.7 - 7.7 K/uL   Lymphocytes Relative 35 %   Lymphs Abs 3.8 0.7 - 4.0 K/uL   Monocytes Relative 7 %   Monocytes Absolute 0.7 0.1 - 1.0 K/uL   Eosinophils Relative 2 %   Eosinophils Absolute 0.2 0.0 - 0.7 K/uL   Basophils Relative 1 %   Basophils Absolute 0.1 0.0 - 0.1 K/uL  Comprehensive metabolic panel     Status: Abnormal   Collection Time: 12/04/16  9:26 PM  Result Value Ref Range   Sodium 138 135 - 145 mmol/L   Potassium 3.5 3.5 - 5.1 mmol/L   Chloride 104 101 - 111 mmol/L   CO2 22 22 - 32 mmol/L   Glucose, Bld 117 (H) 65 - 99 mg/dL   BUN 16 6 - 20 mg/dL   Creatinine, Ser 0.97 0.61 - 1.24 mg/dL   Calcium 9.6 8.9 - 10.3 mg/dL   Total Protein 7.6 6.5 - 8.1 g/dL   Albumin 4.2 3.5 - 5.0 g/dL   AST 20 15 - 41 U/L   ALT 33 17 - 63 U/L   Alkaline Phosphatase 70 38 - 126 U/L   Total Bilirubin 0.7 0.3 - 1.2 mg/dL   GFR calc non Af Amer >60 >60 mL/min   GFR calc Af Amer >60 >60 mL/min    Comment: (NOTE) The eGFR has been calculated using the CKD EPI equation. This calculation has not been validated in all clinical situations. eGFR's persistently <60 mL/min signify possible Chronic Kidney Disease.    Anion gap 12 5 - 15  I-stat troponin, ED     Status: None   Collection Time: 12/04/16  9:35 PM  Result Value Ref Range   Troponin i, poc 0.02 0.00 - 0.08 ng/mL   Comment 3            Comment: Due to the release kinetics of cTnI, a negative result within the first hours of the onset of symptoms does not rule out myocardial infarction with certainty. If myocardial  infarction is still suspected, repeat the test at appropriate intervals.   I-Stat Chem 8, ED     Status: Abnormal   Collection Time: 12/04/16  9:37 PM  Result Value Ref Range  Sodium 135 135 - 145 mmol/L   Potassium 3.5 3.5 - 5.1 mmol/L   Chloride 102 101 - 111 mmol/L   BUN 17 6 - 20 mg/dL   Creatinine, Ser 1.00 0.61 - 1.24 mg/dL   Glucose, Bld 114 (H) 65 - 99 mg/dL   Calcium, Ion 1.07 (L) 1.15 - 1.40 mmol/L   TCO2 25 0 - 100 mmol/L   Hemoglobin 16.0 13.0 - 17.0 g/dL   HCT 47.0 39.0 - 52.0 %  CBG monitoring, ED     Status: Abnormal   Collection Time: 12/04/16  9:45 PM  Result Value Ref Range   Glucose-Capillary 112 (H) 65 - 99 mg/dL  Glucose, capillary     Status: Abnormal   Collection Time: 12/05/16 12:46 AM  Result Value Ref Range   Glucose-Capillary 128 (H) 65 - 99 mg/dL  Lipid panel     Status: Abnormal   Collection Time: 12/05/16  1:38 AM  Result Value Ref Range   Cholesterol 220 (H) 0 - 200 mg/dL   Triglycerides 281 (H) <150 mg/dL   HDL 40 (L) >40 mg/dL   Total CHOL/HDL Ratio 5.5 RATIO   VLDL 56 (H) 0 - 40 mg/dL   LDL Cholesterol 124 (H) 0 - 99 mg/dL    Comment:        Total Cholesterol/HDL:CHD Risk Coronary Heart Disease Risk Table                     Men   Women  1/2 Average Risk   3.4   3.3  Average Risk       5.0   4.4  2 X Average Risk   9.6   7.1  3 X Average Risk  23.4   11.0        Use the calculated Patient Ratio above and the CHD Risk Table to determine the patient's CHD Risk.        ATP III CLASSIFICATION (LDL):  <100     mg/dL   Optimal  100-129  mg/dL   Near or Above                    Optimal  130-159  mg/dL   Borderline  160-189  mg/dL   High  >190     mg/dL   Very High   CBC     Status: None   Collection Time: 12/05/16  1:38 AM  Result Value Ref Range   WBC 8.9 4.0 - 10.5 K/uL   RBC 5.00 4.22 - 5.81 MIL/uL   Hemoglobin 15.6 13.0 - 17.0 g/dL   HCT 45.5 39.0 - 52.0 %   MCV 91.0 78.0 - 100.0 fL   MCH 31.2 26.0 - 34.0 pg   MCHC 34.3  30.0 - 36.0 g/dL   RDW 13.0 11.5 - 15.5 %   Platelets 217 150 - 400 K/uL  Creatinine, serum     Status: None   Collection Time: 12/05/16  1:38 AM  Result Value Ref Range   Creatinine, Ser 0.93 0.61 - 1.24 mg/dL   GFR calc non Af Amer >60 >60 mL/min   GFR calc Af Amer >60 >60 mL/min    Comment: (NOTE) The eGFR has been calculated using the CKD EPI equation. This calculation has not been validated in all clinical situations. eGFR's persistently <60 mL/min signify possible Chronic Kidney Disease.   Glucose, capillary     Status: Abnormal   Collection Time: 12/05/16  4:20 AM  Result Value Ref Range   Glucose-Capillary 122 (H) 65 - 99 mg/dL   Ct Head Code Stroke Wo Contrast`  Result Date: 12/04/2016 CLINICAL DATA:  Code stroke. LEFT-sided numbness beginning at 10 a.m., facial droop. History of hypertension, hyperlipidemia. EXAM: CT HEAD WITHOUT CONTRAST TECHNIQUE: Contiguous axial images were obtained from the base of the skull through the vertex without intravenous contrast. COMPARISON:  None. FINDINGS: BRAIN: 5 mm hypodensity RIGHT putaminal (axial 16/32). No intraparenchymal hemorrhage, mass effect nor midline shift. The ventricles and sulci are normal. No acute large vascular territory infarcts. No abnormal extra-axial fluid collections. Basal cisterns are patent. VASCULAR: Trace calcific atherosclerosis LEFT vertebral artery. SKULL/SOFT TISSUES: No skull fracture. No significant soft tissue swelling. ORBITS/SINUSES: The included ocular globes and orbital contents are normal.Moderate maxillary sinus mucosal thickening. Mastoid air cells are well aerated. OTHER: None. ASPECTS [facility] Stroke Program Early CT Score) - Ganglionic level infarction (caudate, lentiform nuclei, internal capsule, insula, M1-M3 cortex): 6 - Supraganglionic infarction (M4-M6 cortex): 3 Total score (0-10 with 10 being normal): 9 IMPRESSION: 1. Age indeterminate RIGHT basal ganglia lacunar infarct. 2. ASPECTS is 9. Critical  Value/emergent results were called by telephone at the time of interpretation on 12/04/2016 at 9:52 pm to Dr. Leonette Monarch, who verbally acknowledged these results. Electronically Signed   By: Elon Alas M.D.   On: 12/04/2016 21:53    Assessment: 51 y.o. male with acute to subacute onset of left hemiparesis, facial droop and dysarthria 1. CT head revealed an age indeterminate right basal ganglia lacunar infarct 2. Stroke Risk Factors - CAD, HLD, HTN, prior TIA and medication noncompliance  Plan: 1. HgbA1c, fasting lipid panel 2. MRI, MRA of the brain without contrast 3. PT consult, OT consult, Speech consult 4. Echocardiogram 5. Carotid dopplers 6. Continue ASA. Start atorvastatin 7. Now out of permissive HTN time window. 8. Risk factor modification 9. Telemetry monitoring 10. Frequent neuro checks  _0  signed: Dr. Kerney Elbe  12/05/2016, 7:18 AM

## 2016-12-05 NOTE — Plan of Care (Signed)
Problem: Safety: Goal: Ability to remain free from injury will improve Outcome: Progressing Patient verbalized understanding of calling for assistance when attempting to ambulate.  Pt demonstrated his understanding by using call bell when he needed assistance standing to void.  Patient progressing towards goal.

## 2016-12-05 NOTE — Progress Notes (Signed)
PT Cancellation Note  Patient Details Name: ANDIE MORTIMER MRN: [id number] DOB: 03-02-1966   Cancelled Treatment:    Reason Eval/Treat Not Completed: Patient at procedure or test/unavailable Currently off unit for MRI. Will check back later this afternoon for PT evaluation.  Ellouise Newer 12/05/2016, 1:52 PM  [phone]

## 2016-12-05 NOTE — Progress Notes (Addendum)
Patient ID: [name], male   DOB: 1965/11/24, 51 y.o.   MRN: [id number]    PROGRESS NOTE  Brandon Martin  XLK:[id number] DOB: [birth year] DOA: 12/04/2016  PCP: Brandon Rua, MD   Brief Narrative:  Pt is 51 yo male with known HTN, CAD s/p CABG x 3, HLD, presented with left sided weakness, upper extremity > lower extremity, left facial droop and slurred speech.   Assessment & Plan:   Active Problems: Acute left hemiparesis, facial droop and dysarthria  - per CT head, age indeterminate right basal ganglia lacunar infarct  - plan for MRI - SLP eval done, pt passed and diet advanced - will need ECHO, Carotid dopplers - PT/OT eval - A1C ordered  - permissive HTN for now  - LDL 222, will need statin - keep on aspirin for now    Coronary artery disease - keep on aspirin, add statin     Hyperlipidemia - add statin as noted above     Tobacco use disorder - cessation consultation provided   DVT prophylaxis: Lovenox SQ Code Status: Full  Family Communication: Patient at bedside  Disposition Plan: to be determined   Consultants:   Neurology   Procedures:   None  Antimicrobials:   None  Subjective: Pt reports ongoing LUE and LLE weakness, overall better.   Objective: Vitals:   12/04/16 2356 12/05/16 0051 12/05/16 0054 12/05/16 0523  BP: (!) 166/96 (!) 170/103 (!) 153/95 (!) 147/88  Pulse: (!) 108 94 92 81  Resp: 19 18  18   Temp: 97.6 F (36.4 C) 98.2 F (36.8 C)  97.9 F (36.6 C)  TempSrc: Oral Oral    SpO2: 97% 98% 97% 96%  Weight:  92.6 kg (204 lb 3.2 oz)    Height:  5\' 8"  (1.727 m)     No intake or output data in the 24 hours ending 12/05/16 1327 Filed Weights   12/04/16 2120 12/05/16 0051  Weight: 90.7 kg (200 lb) 92.6 kg (204 lb 3.2 oz)    Examination:  General exam: Appears calm and comfortable  Respiratory system: Clear to auscultation. Respiratory effort normal. Cardiovascular system: S1 & S2 heard, RRR. No JVD, murmurs, rubs, gallops or  clicks. No pedal edema. Gastrointestinal system: Abdomen is nondistended, soft and nontender. No organomegaly or masses felt.  Central nervous system: Alert and oriented. Left facial droop, LUE strength 2/5proximally and 3/5 distally, LLE strength 4/5 proximally and distally   Data Reviewed: I have personally reviewed following labs and imaging studies  CBC:  Recent Labs Lab 12/04/16 2126 12/04/16 2137 12/05/16 0138  WBC 10.9*  --  8.9  NEUTROABS 6.0  --   --   HGB 16.1 16.0 15.6  HCT 45.9 47.0 45.5  MCV 90.9  --  91.0  PLT 239  --  217   Basic Metabolic Panel:  Recent Labs Lab 12/04/16 2126 12/04/16 2137 12/05/16 0138  NA 138 135  --   K 3.5 3.5  --   CL 104 102  --   CO2 22  --   --   GLUCOSE 117* 114*  --   BUN 16 17  --   CREATININE 0.97 1.00 0.93  CALCIUM 9.6  --   --    Liver Function Tests:  Recent Labs Lab 12/04/16 2126  AST 20  ALT 33  ALKPHOS 70  BILITOT 0.7  PROT 7.6  ALBUMIN 4.2   Coagulation Profile:  Recent Labs Lab 12/04/16 2126  INR 0.95  CBG:  Recent Labs Lab 12/04/16 2145 12/05/16 0046 12/05/16 0420  GLUCAP 112* 128* 122*   Lipid Profile:  Recent Labs  12/05/16 0138  CHOL 220*  HDL 40*  LDLCALC 124*  TRIG 281*  CHOLHDL 5.5   Urine analysis:    Component Value Date/Time   COLORURINE STRAW (A) 05/10/2011 2241   APPEARANCEUR CLEAR 05/10/2011 2241   LABSPEC 1.005 05/10/2011 2241   PHURINE 6.0 05/10/2011 2241   GLUCOSEU NEGATIVE 05/10/2011 2241   HGBUR TRACE (A) 05/10/2011 2241   BILIRUBINUR NEGATIVE 05/10/2011 2241   KETONESUR NEGATIVE 05/10/2011 2241   PROTEINUR NEGATIVE 05/10/2011 2241   UROBILINOGEN 0.2 05/10/2011 2241   NITRITE NEGATIVE 05/10/2011 2241   LEUKOCYTESUR NEGATIVE 05/10/2011 2241   Radiology Studies: Ct Head Code Stroke Wo Contrast`  Result Date: 12/04/2016 CLINICAL DATA:  Code stroke. LEFT-sided numbness beginning at 10 a.m., facial droop. History of hypertension, hyperlipidemia. EXAM: CT  HEAD WITHOUT CONTRAST TECHNIQUE: Contiguous axial images were obtained from the base of the skull through the vertex without intravenous contrast. COMPARISON:  None. FINDINGS: BRAIN: 5 mm hypodensity RIGHT putaminal (axial 16/32). No intraparenchymal hemorrhage, mass effect nor midline shift. The ventricles and sulci are normal. No acute large vascular territory infarcts. No abnormal extra-axial fluid collections. Basal cisterns are patent. VASCULAR: Trace calcific atherosclerosis LEFT vertebral artery. SKULL/SOFT TISSUES: No skull fracture. No significant soft tissue swelling. ORBITS/SINUSES: The included ocular globes and orbital contents are normal.Moderate maxillary sinus mucosal thickening. Mastoid air cells are well aerated. OTHER: None. ASPECTS [facility] Stroke Program Early CT Score) - Ganglionic level infarction (caudate, lentiform nuclei, internal capsule, insula, M1-M3 cortex): 6 - Supraganglionic infarction (M4-M6 cortex): 3 Total score (0-10 with 10 being normal): 9 IMPRESSION: 1. Age indeterminate RIGHT basal ganglia lacunar infarct. 2. ASPECTS is 9. Critical Value/emergent results were called by telephone at the time of interpretation on 12/04/2016 at 9:52 pm to Dr. Eudelia Bunch, who verbally acknowledged these results. Electronically Signed   By: Awilda Metro M.D.   On: 12/04/2016 21:53   Scheduled Meds: . aspirin  300 mg Rectal Daily   Or  . aspirin  325 mg Oral Daily  . enoxaparin (LOVENOX) injection  40 mg Subcutaneous Q24H  . [START ON 12/06/2016] famotidine  20 mg Oral Daily   Continuous Infusions:   LOS: 1 day   Time spent: 25 minutes   Debbora Presto, MD [facility] Pager [phone]  If 7PM-7AM, please contact night-coverage [email or web address] Password [email or web address] 12/05/2016, 1:27 PM

## 2016-12-05 NOTE — Evaluation (Signed)
Occupational Therapy Evaluation Patient Details Name: Brandon Martin MRN: [id number] DOB: May 05, 1966 Today's Date: 12/05/2016    History of Present Illness Pt is a 51 y.o. male with medical history significant for HTN, CAD s/p CABGx3. gastric ulcers, and HLD. He presented to the ED with L sided weakness, L facial droop, and slurring of speech. CT reveals age indeterminate R basal ganglia lacunar infarct.    Clinical Impression   PTA, pt was independent with ADL and functional mobility and lives with his mother and brother. His brother will be able to provide 24 hour assistance. Pt currently requires max assist for LB ADL, mod assist for UB ADL, and min assist for ambulating toilet transfers. He presents with decreased functional use of L UE, decreased dynamic standing balance, signs of ataxia with functional mobility, and poor safety awareness impacting ability to participate in ADL at PLOF. Pt would benefit from continued OT services while admitted to improve independence with ADL and functional mobility. Feel pt would best benefit from CIR level rehabilitation post-acute D/C in order to maximize return to PLOF. Educated pt and mother on benefits of intensive rehabilitation and pt verbalizes understanding but reports hesitance concerning staying in an inpatient setting. OT will continue to follow while admitted and continue to update D/C recommendations as appropriate.     Follow Up Recommendations  CIR;Supervision/Assistance - 24 hour (Pt likely will refuse)    Equipment Recommendations  Other (comment);3 in 1 bedside commode;Tub/shower seat (TBD at next venue of care)    Recommendations for Other Services Rehab consult     Precautions / Restrictions Precautions Precautions: Fall Restrictions Weight Bearing Restrictions: No      Mobility Bed Mobility Overal bed mobility: Needs Assistance Bed Mobility: Supine to Sit     Supine to sit: Min guard     General bed mobility comments:  Min guard for safety.  Transfers Overall transfer level: Needs assistance   Transfers: Sit to/from Stand Sit to Stand: Min assist         General transfer comment: Min lifting and steadying assist.     Balance Overall balance assessment: Needs assistance Sitting-balance support: No upper extremity supported;Feet supported Sitting balance-Leahy Scale: Fair     Standing balance support: Bilateral upper extremity supported;Single extremity supported;During functional activity Standing balance-Leahy Scale: Poor Standing balance comment: Reliant on UE support and external assistance.                            ADL either performed or assessed with clinical judgement   ADL Overall ADL's : Needs assistance/impaired Eating/Feeding: Minimal assistance;Sitting   Grooming: Minimal assistance;Sitting   Upper Body Bathing: Moderate assistance;Sitting   Lower Body Bathing: Maximal assistance;Sit to/from stand   Upper Body Dressing : Moderate assistance;Sitting   Lower Body Dressing: Maximal assistance;Sit to/from stand   Toilet Transfer: Minimal assistance;Ambulation Toilet Transfer Details (indicate cue type and reason): Pt reaching for furniture unsafely. Toileting- Clothing Manipulation and Hygiene: Moderate assistance;Sit to/from stand       Functional mobility during ADLs: Minimal assistance General ADL Comments: Pt unsafe during ambulation, reaching for rolling furniture and IV pole for stability and requiring VC's to prevent this to improve safety.      Vision   Additional Comments: No deficits noted on functional assessment. Will continue to assess.      Perception     Praxis      Pertinent Vitals/Pain Pain Assessment: Faces Faces Pain Scale: Hurts  even more Pain Location: L UE with PROM Pain Descriptors / Indicators: Sharp Pain Intervention(s): Monitored during session;Repositioned;Limited activity within patient's tolerance     Hand Dominance  Right   Extremity/Trunk Assessment Upper Extremity Assessment Upper Extremity Assessment: LUE deficits/detail LUE Deficits / Details: Minimal AROM of shoulder and elbow. Able to complete lap slides with increased time and effort. Hand Brunnstrom level II approaching III and arm level II.   Lower Extremity Assessment Lower Extremity Assessment: Defer to PT evaluation (ataxic movement noted during mobility)       Communication Communication Communication:  (slurred speech)   Cognition Arousal/Alertness: Awake/alert Behavior During Therapy: WFL for tasks assessed/performed Overall Cognitive Status: Impaired/Different from baseline Area of Impairment: Safety/judgement                         Safety/Judgement: Decreased awareness of safety     General Comments: Pt with poor safety awareness throughout with decreased understanding of impact of deficits on safety and independence.    General Comments       Exercises Exercises: Other exercises Other Exercises Other Exercises: Educated pt on lap slides for HEP with L UE. Able to complete with supervision and increased time.    Shoulder Instructions      Home Living Family/patient expects to be discharged to:: Private residence Living Arrangements: Parent;Other relatives (brother) Available Help at Discharge: Family;Available 24 hours/day Type of Home: House Home Access: Stairs to enter [employer] of Steps: 2 steps down Entrance Stairs-Rails: None Home Layout: One level     Bathroom Shower/Tub: Chief Strategy Officer: Standard Bathroom Accessibility: Yes   Home Equipment: Cane - single point;Shower seat          Prior Functioning/Environment Level of Independence: Independent                 OT Problem List: Decreased strength;Decreased range of motion;Decreased activity tolerance;Impaired balance (sitting and/or standing);Decreased coordination;Decreased safety  awareness;Decreased knowledge of use of DME or AE;Decreased knowledge of precautions;Impaired UE functional use;Pain      OT Treatment/Interventions: Self-care/ADL training;Therapeutic exercise;Neuromuscular education;Energy conservation;DME and/or AE instruction;Therapeutic activities;Cognitive remediation/compensation;Patient/family education;Balance training;Visual/perceptual remediation/compensation    OT Goals(Current goals can be found in the care plan section) Acute Rehab OT Goals Patient Stated Goal: to go home ASAP OT Goal Formulation: With patient/family Time For Goal Achievement: 12/19/16 Potential to Achieve Goals: Good ADL Goals Pt Will Perform Grooming: standing;with modified independence Pt Will Perform Upper Body Dressing: with supervision;sitting (with hemidressing techniques) Pt Will Perform Lower Body Dressing: sit to/from stand;with supervision Pt Will Transfer to Toilet: with supervision;ambulating;regular height toilet Pt Will Perform Toileting - Clothing Manipulation and hygiene: with supervision;sit to/from stand Pt Will Perform Tub/Shower Transfer: with supervision;ambulating;shower seat;3 in 1;Tub transfer Pt/caregiver will Perform Home Exercise Program: Increased ROM;Increased strength;Left upper extremity;With written HEP provided;Independently Additional ADL Goal #1: Pt will complete morning ADL routine with no more than 1 verbal cue for safety.   OT Frequency: Min 3X/week   Barriers to D/C:            Co-evaluation              AM-PAC PT "6 Clicks" Daily Activity     Outcome Measure Help from another person eating meals?: A Little Help from another person taking care of personal grooming?: A Little Help from another person toileting, which includes using toliet, bedpan, or urinal?: A Lot Help from another person bathing (including washing, rinsing, drying)?:  A Lot Help from another person to put on and taking off regular upper body clothing?: A  Lot Help from another person to put on and taking off regular lower body clothing?: A Lot 6 Click Score: 14   End of Session Equipment Utilized During Treatment: Gait belt  Activity Tolerance: Patient tolerated treatment well Patient left: in chair;with call bell/phone within reach;with chair alarm set  OT Visit Diagnosis: Hemiplegia and hemiparesis;Unsteadiness on feet (R26.81) Hemiplegia - Right/Left: Left Hemiplegia - dominant/non-dominant: Non-Dominant Hemiplegia - caused by: Cerebral infarction                Time: 2536-6440 OT Time Calculation (min): 25 min Charges:  OT General Charges $OT Visit: 1 Procedure OT Evaluation $OT Eval Moderate Complexity: 1 Procedure OT Treatments $Self Care/Home Management : 8-22 mins G-Codes:     Doristine Section, MS OTR/L  Pager: [phone]   Arbor Cohen A Britney Captain 12/05/2016, 12:55 PM

## 2016-12-06 ENCOUNTER — Encounter (HOSPITAL_COMMUNITY): Payer: Self-pay | Admitting: Physical Medicine and Rehabilitation

## 2016-12-06 ENCOUNTER — Other Ambulatory Visit (HOSPITAL_COMMUNITY): Payer: Medicare Other

## 2016-12-06 ENCOUNTER — Inpatient Hospital Stay (HOSPITAL_COMMUNITY): Payer: Medicare Other

## 2016-12-06 DIAGNOSIS — I639 Cerebral infarction, unspecified: Secondary | ICD-10-CM

## 2016-12-06 DIAGNOSIS — G8194 Hemiplegia, unspecified affecting left nondominant side: Secondary | ICD-10-CM

## 2016-12-06 LAB — CBC
HEMATOCRIT: 45.6 % (ref 39.0–52.0)
Hemoglobin: 15.5 g/dL (ref 13.0–17.0)
MCH: 31.4 pg (ref 26.0–34.0)
MCHC: 34 g/dL (ref 30.0–36.0)
MCV: 92.3 fL (ref 78.0–100.0)
PLATELETS: 210 10*3/uL (ref 150–400)
RBC: 4.94 MIL/uL (ref 4.22–5.81)
RDW: 13 % (ref 11.5–15.5)
WBC: 9.9 10*3/uL (ref 4.0–10.5)

## 2016-12-06 LAB — BASIC METABOLIC PANEL
ANION GAP: 12 (ref 5–15)
BUN: 12 mg/dL (ref 6–20)
CO2: 24 mmol/L (ref 22–32)
CREATININE: 0.95 mg/dL (ref 0.61–1.24)
Calcium: 9.1 mg/dL (ref 8.9–10.3)
Chloride: 102 mmol/L (ref 101–111)
GLUCOSE: 112 mg/dL — AB (ref 65–99)
Potassium: 3.7 mmol/L (ref 3.5–5.1)
Sodium: 138 mmol/L (ref 135–145)

## 2016-12-06 MED ORDER — TRAZODONE HCL 50 MG PO TABS
50.0000 mg | ORAL_TABLET | Freq: Every evening | ORAL | Status: DC | PRN
  Administered 2016-12-07: 50 mg via ORAL
  Filled 2016-12-06: qty 1

## 2016-12-06 MED ORDER — CLOPIDOGREL BISULFATE 75 MG PO TABS
75.0000 mg | ORAL_TABLET | Freq: Every day | ORAL | Status: DC
  Administered 2016-12-06 – 2016-12-07 (×2): 75 mg via ORAL
  Filled 2016-12-06 (×2): qty 1

## 2016-12-06 MED ORDER — KETOROLAC TROMETHAMINE 30 MG/ML IJ SOLN: 30.0000 mg | Freq: Four times a day (QID) | INTRAMUSCULAR | Status: DC | PRN

## 2016-12-06 MED ORDER — SENNOSIDES-DOCUSATE SODIUM 8.6-50 MG PO TABS
1.0000 | ORAL_TABLET | Freq: Two times a day (BID) | ORAL | Status: DC
  Administered 2016-12-06 – 2016-12-07 (×3): 1 via ORAL
  Filled 2016-12-06 (×3): qty 1

## 2016-12-06 NOTE — Progress Notes (Addendum)
Patient ID: Brandon Martin, male   DOB: 1966-03-14, 51 y.o.   MRN: [id number]    PROGRESS NOTE  Tymere Dilello Saddler  WUJ:[id number] DOB: 04-Feb-1966 DOA: 12/04/2016  PCP: Joycelyn Rua, MD   Brief Narrative:  Pt is 51 yo male with known HTN, CAD s/p CABG x 3, HLD, presented with left sided weakness, upper extremity > lower extremity, left facial droop and slurred speech.   Assessment & Plan:   Active Problems: Acute left hemiparesis, facial droop and dysarthria  - per CT head, age indeterminate right basal ganglia lacunar infarct  - MRI brain confirms acute right MCA lenticulostriate area stroke - pt reports doing better, still with resultant L upper extremity weakness - ECHO and carotid dopplers results pending  - A1C pending  - LDL 222 - IR recommended, awaiting insurance approval  - continue Aspirin 325 mg PO QD    Coronary artery disease - no anginal symptoms - continue aspirin and statin     Hyperlipidemia - statin ordered     Tobacco use disorder - discussed cessation, pt verbalized understanding   DVT prophylaxis: Lovenox SQ Code Status: Full  Family Communication: Patient at bedside  Disposition Plan: IR once insurance approves   Consultants:   Neurology   Inpatient rehab doctor   Procedures:   None  Antimicrobials:   None  Subjective: Pt reports feeling better.   Objective: Vitals:   12/05/16 1552 12/05/16 2148 12/05/16 2320 12/06/16 0519  BP: (!) 150/94 (!) 173/110 (!) 152/90 (!) 156/96  Pulse: 96 91 95 84  Resp: 18 18  18   Temp: 97.8 F (36.6 C) 97.6 F (36.4 C)  97.7 F (36.5 C)  TempSrc:  Oral  Oral  SpO2: 97% 96%  97%  Weight:      Height:        Intake/Output Summary (Last 24 hours) at 12/06/16 1423 Last data filed at 12/06/16 0900  Gross per 24 hour  Intake              240 ml  Output             2150 ml  Net            -1910 ml   Filed Weights   12/04/16 2120 12/05/16 0051  Weight: 90.7 kg (200 lb) 92.6 kg (204 lb 3.2 oz)     Physical Exam  Constitutional: Appears well-developed and well-nourished. No distress.  CVS: RRR, S1/S2 +, no gallops, no carotid bruit.  Pulmonary: Effort and breath sounds normal, no stridor, rhonchi, wheezes, rales.  Abdominal: Soft. BS +,  no distension, tenderness, rebound or guarding.  Musculoskeletal: LUE still week, strength 2/5, normal strength in LE's bilaterally   Data Reviewed: I have personally reviewed following labs and imaging studies  CBC:  Recent Labs Lab 12/04/16 2126 12/04/16 2137 12/05/16 0138 12/06/16 0502  WBC 10.9*  --  8.9 9.9  NEUTROABS 6.0  --   --   --   HGB 16.1 16.0 15.6 15.5  HCT 45.9 47.0 45.5 45.6  MCV 90.9  --  91.0 92.3  PLT 239  --  217 210   Basic Metabolic Panel:  Recent Labs Lab 12/04/16 2126 12/04/16 2137 12/05/16 0138 12/06/16 0502  NA 138 135  --  138  K 3.5 3.5  --  3.7  CL 104 102  --  102  CO2 22  --   --  24  GLUCOSE 117* 114*  --  112*  BUN  16 17  --  12  CREATININE 0.97 1.00 0.93 0.95  CALCIUM 9.6  --   --  9.1   Liver Function Tests:  Recent Labs Lab 12/04/16 2126  AST 20  ALT 33  ALKPHOS 70  BILITOT 0.7  PROT 7.6  ALBUMIN 4.2   Coagulation Profile:  Recent Labs Lab 12/04/16 2126  INR 0.95   CBG:  Recent Labs Lab 12/04/16 2145 12/05/16 0046 12/05/16 0420  GLUCAP 112* 128* 122*   Lipid Profile:  Recent Labs  12/05/16 0138  CHOL 220*  HDL 40*  LDLCALC 124*  TRIG 281*  CHOLHDL 5.5   Urine analysis:    Component Value Date/Time   COLORURINE STRAW (A) 05/10/2011 2241   APPEARANCEUR CLEAR 05/10/2011 2241   LABSPEC 1.005 05/10/2011 2241   PHURINE 6.0 05/10/2011 2241   GLUCOSEU NEGATIVE 05/10/2011 2241   HGBUR TRACE (A) 05/10/2011 2241   BILIRUBINUR NEGATIVE 05/10/2011 2241   KETONESUR NEGATIVE 05/10/2011 2241   PROTEINUR NEGATIVE 05/10/2011 2241   UROBILINOGEN 0.2 05/10/2011 2241   NITRITE NEGATIVE 05/10/2011 2241   LEUKOCYTESUR NEGATIVE 05/10/2011 2241   Radiology  Studies: Dg Chest 2 View  Result Date: 12/05/2016 CLINICAL DATA:  No chest complaints. EXAM: CHEST  2 VIEW COMPARISON:  Nov 16, 2012 FINDINGS: Pleural thickening versus tiny pleural effusions, unchanged. The cardiomediastinal silhouette is stable. The lungs remain clear. IMPRESSION: No interval change. Pleural thickening versus tiny pleural effusions. Electronically Signed   By: Gerome Sam III M.D   On: 12/05/2016 14:42   Mr Brain Wo Contrast  Result Date: 12/05/2016 CLINICAL DATA:  LEFT-sided numbness and weakness began at 10 a.m. 12/04/2016. Stroke risk factors include hypertension, hyperlipidemia, coronary artery disease, prior TIA, and medication noncompliance. EXAM: MRI HEAD WITHOUT CONTRAST MRA HEAD WITHOUT CONTRAST TECHNIQUE: Multiplanar, multiecho pulse sequences of the brain and surrounding structures were obtained without intravenous contrast. Angiographic images of the head were obtained using MRA technique without contrast. COMPARISON:  Code stroke CT head 12/04/2016. FINDINGS: MRI HEAD FINDINGS Brain: Restricted diffusion, RIGHT lentiform nucleus extending to the RIGHT centrum semiovale and periventricular white matter, consistent with acute nonhemorrhagic infarction. RIGHT MCA lenticulostriate territory involvement. There is a chronic lacunar infarct involving the RIGHT putamen, identified on CT, without restriction or hemorrhage. Normal for age cerebral volume. Mild subcortical and periventricular T2 and FLAIR hyperintensities, likely chronic microvascular ischemic change. No mass lesion, hydrocephalus, or extra-axial fluid. Vascular: Reported separately. Skull and upper cervical spine: Normal marrow signal. Sinuses/Orbits: Negative. Other: None. Compared with the code stroke CT, the acute infarct is not visible. MRA HEAD FINDINGS The internal carotid arteries are widely patent. No stenosis or dissection. The basilar artery is widely patent. Vertebrals are codominant and widely patent.  There is no stenosis of the anterior, middle, or posterior cerebral arteries. Slight irregularity distal MCA and PCA branches suggest intracranial atherosclerotic change. No cerebellar branch occlusion.  No saccular aneurysm. IMPRESSION: Acute RIGHT MCA lenticulostriate territory infarct, nonhemorrhagic affecting the lentiform nucleus and adjacent white matter. Chronic RIGHT basal ganglia lacune. Mild chronic microvascular ischemic change. Negative MRA intracranial circulation. Electronically Signed   By: Elsie Stain M.D.   On: 12/05/2016 14:23   Mr Maxine Glenn Head/brain UJ Cm  Result Date: 12/05/2016 CLINICAL DATA:  LEFT-sided numbness and weakness began at 10 a.m. 12/04/2016. Stroke risk factors include hypertension, hyperlipidemia, coronary artery disease, prior TIA, and medication noncompliance. EXAM: MRI HEAD WITHOUT CONTRAST MRA HEAD WITHOUT CONTRAST TECHNIQUE: Multiplanar, multiecho pulse sequences of the brain and surrounding structures  were obtained without intravenous contrast. Angiographic images of the head were obtained using MRA technique without contrast. COMPARISON:  Code stroke CT head 12/04/2016. FINDINGS: MRI HEAD FINDINGS Brain: Restricted diffusion, RIGHT lentiform nucleus extending to the RIGHT centrum semiovale and periventricular white matter, consistent with acute nonhemorrhagic infarction. RIGHT MCA lenticulostriate territory involvement. There is a chronic lacunar infarct involving the RIGHT putamen, identified on CT, without restriction or hemorrhage. Normal for age cerebral volume. Mild subcortical and periventricular T2 and FLAIR hyperintensities, likely chronic microvascular ischemic change. No mass lesion, hydrocephalus, or extra-axial fluid. Vascular: Reported separately. Skull and upper cervical spine: Normal marrow signal. Sinuses/Orbits: Negative. Other: None. Compared with the code stroke CT, the acute infarct is not visible. MRA HEAD FINDINGS The internal carotid arteries are  widely patent. No stenosis or dissection. The basilar artery is widely patent. Vertebrals are codominant and widely patent. There is no stenosis of the anterior, middle, or posterior cerebral arteries. Slight irregularity distal MCA and PCA branches suggest intracranial atherosclerotic change. No cerebellar branch occlusion.  No saccular aneurysm. IMPRESSION: Acute RIGHT MCA lenticulostriate territory infarct, nonhemorrhagic affecting the lentiform nucleus and adjacent white matter. Chronic RIGHT basal ganglia lacune. Mild chronic microvascular ischemic change. Negative MRA intracranial circulation. Electronically Signed   By: Elsie Stain M.D.   On: 12/05/2016 14:23   Ct Head Code Stroke Wo Contrast`  Result Date: 12/04/2016 CLINICAL DATA:  Code stroke. LEFT-sided numbness beginning at 10 a.m., facial droop. History of hypertension, hyperlipidemia. EXAM: CT HEAD WITHOUT CONTRAST TECHNIQUE: Contiguous axial images were obtained from the base of the skull through the vertex without intravenous contrast. COMPARISON:  None. FINDINGS: BRAIN: 5 mm hypodensity RIGHT putaminal (axial 16/32). No intraparenchymal hemorrhage, mass effect nor midline shift. The ventricles and sulci are normal. No acute large vascular territory infarcts. No abnormal extra-axial fluid collections. Basal cisterns are patent. VASCULAR: Trace calcific atherosclerosis LEFT vertebral artery. SKULL/SOFT TISSUES: No skull fracture. No significant soft tissue swelling. ORBITS/SINUSES: The included ocular globes and orbital contents are normal.Moderate maxillary sinus mucosal thickening. Mastoid air cells are well aerated. OTHER: None. ASPECTS [facility] Stroke Program Early CT Score) - Ganglionic level infarction (caudate, lentiform nuclei, internal capsule, insula, M1-M3 cortex): 6 - Supraganglionic infarction (M4-M6 cortex): 3 Total score (0-10 with 10 being normal): 9 IMPRESSION: 1. Age indeterminate RIGHT basal ganglia lacunar infarct. 2. ASPECTS  is 9. Critical Value/emergent results were called by telephone at the time of interpretation on 12/04/2016 at 9:52 pm to Dr. Eudelia Bunch, who verbally acknowledged these results. Electronically Signed   By: Awilda Metro M.D.   On: 12/04/2016 21:53   Scheduled Meds: . aspirin  300 mg Rectal Daily   Or  . aspirin  325 mg Oral Daily  . atorvastatin  20 mg Oral q1800  . enoxaparin (LOVENOX) injection  40 mg Subcutaneous Q24H  . famotidine  20 mg Oral Daily   Continuous Infusions:   LOS: 2 days   Time spent: 25 minutes   Debbora Presto, MD [facility] Pager [phone]  If 7PM-7AM, please contact night-coverage [email or web address] Password [email or web address] 12/06/2016, 2:23 PM

## 2016-12-06 NOTE — Clinical Social Work Note (Signed)
Clinical Social Work Assessment  Patient Details  Name: Brandon Martin MRN: [id number] Date of Birth: 11/23/1965  Date of referral:  12/06/16               Reason for consult:  Facility Placement, Discharge Planning                Permission sought to share information with:  Facility Sport and exercise psychologist, Family Supports Permission granted to share information::  Yes, Verbal Permission Granted  Name::     Engineer, manufacturing::  SNFs  Relationship::  Mother  Contact Information:  [phone]  Housing/Transportation Living arrangements for the past 2 months:  Single Family Home Source of Information:  Patient Patient Interpreter Needed:  None Criminal Activity/Legal Involvement Pertinent to Current Situation/Hospitalization:  No - Comment as needed Significant Relationships:  Siblings, Parents Lives with:  Parents, Siblings Do you feel safe going back to the place where you live?  No Need for family participation in patient care:  No (Coment)  Care giving concerns:  CSW received consult for possible SNF placement at time of discharge. CSW spoke with patient regarding PT recommendation of SNF placement at time of discharge if [facility] is unable to admit patient. Patient reported that he lives with his mother and brother and would like to get stronger before returning home. Patient expressed understanding of PT recommendation and is agreeable to SNF placement at time of discharge, though he prefers to go to [facility]. CSW to continue to follow and assist with discharge planning needs.   Social Worker assessment / plan:  CSW spoke with patient concerning possibility of rehab at [facility] before returning home if [facility] is unable to accept patient.  Employment status:  Other (Comment) Insurance information:  Managed Medicare PT Recommendations:  Inpatient Rehab Consult Information / Referral to community resources:  [place]  Patient/Family's Response to care:  Patient recognizes need for rehab  before returning home and is agreeable to a SNF in [facility] if he is unable to go to [facility]. He has heard good things about [facility], so he is hopeful to go there and his family will help him after discharge.  Patient/Family's Understanding of and Emotional Response to Diagnosis, Current Treatment, and Prognosis:  Patient/family is realistic regarding therapy needs and expressed being hopeful for SNF placement. Patient expressed understanding of CSW role and discharge process as well as his discharge process. No questions/concerns about plan or treatment.    Emotional Assessment Appearance:  Appears stated age Attitude/Demeanor/Rapport:  Other (Appropriate) Affect (typically observed):  Accepting, Appropriate, Pleasant Orientation:  Oriented to Self, Oriented to Place, Oriented to  Time, Oriented to Situation Alcohol / Substance use:  Not Applicable Psych involvement (Current and /or in the community):  No (Comment)  Discharge Needs  Concerns to be addressed:  Care Coordination Readmission within the last 30 days:  No Current discharge risk:  None Barriers to Discharge:  Continued Medical Work up   [employer], [place] 12/06/2016, 10:01 AM

## 2016-12-06 NOTE — Consult Note (Signed)
Physical Medicine and Rehabilitation Consult   Reason for Consult:left hemiparesis Referring Physician: Doyle Askew   HPI: Brandon Martin is a 51 y.o. male with history of CAD s/p CABG, [name], gastric ulcers, tobacco use;  who was admitted on 12/04/16 with LUE weakness progressing to LLE weakness, left facial droop and slurred speech as day progressed. He was found to have right lacunar infarct and    Review of Systems  Constitutional: Negative for fever.  HENT: Negative for hearing loss.   Eyes: Negative for blurred vision.  Respiratory: Negative for cough.   Cardiovascular: Negative for chest pain.  Gastrointestinal: Negative for heartburn.  Genitourinary: Negative for dysuria.  Musculoskeletal: Negative for myalgias.  Skin: Negative for rash.  Neurological: Positive for speech change and focal weakness. Negative for dizziness.      Past Medical History:  Diagnosis Date  . Coronary artery disease   . GERD (gastroesophageal reflux disease)   . Hyperlipidemia   . Hypertension   . Pleural effusion   . Tobacco use disorder     Past Surgical History:  Procedure Laterality Date  . CORONARY ARTERY BYPASS GRAFT  x 3    Family History  Problem Relation Age of Onset  . Hypertension Mother   . Arthritis Mother   . Cancer Father   . Transient ischemic attack Sister     Social History:  reports that he has been smoking Cigarettes.  He has quit using smokeless tobacco. He reports that he does not drink alcohol or use drugs.     Allergies  Allergen Reactions  . Aspirin Other (See Comments)    HX of gastric ulcers  . Morphine And Related Nausea And Vomiting  . Latex Rash   Medications Prior to Admission  Medication Sig Dispense Refill  . metoprolol tartrate (LOPRESSOR) 25 MG tablet Take 1 tablet (25 mg total) by mouth 2 (two) times daily. 60 tablet 1  . nitroGLYCERIN (NITROSTAT) 0.4 MG SL tablet Place 1 tablet (0.4 mg total) under the tongue every 5  (five) minutes as needed for chest pain. (Patient not taking: Reported on 12/04/2016) 25 tablet 11    Home: Home Living Family/patient expects to be discharged to:: Private residence Living Arrangements: Parent, Other relatives (brother) Available Help at Discharge: Family, Available 24 hours/day Type of Home: House Home Access: Stairs to enter [employer] of Steps: 2 steps down Entrance Stairs-Rails: None Home Layout: One level Bathroom Shower/Tub: Chiropodist: Standard Bathroom Accessibility: Yes Home Equipment: Careers adviser History: Prior Function Level of Independence: Independent Functional Status:  Mobility: Bed Mobility Overal bed mobility: Needs Assistance Bed Mobility: Supine to Sit Supine to sit: Min guard General bed mobility comments: Min guard for safety. Transfers Overall transfer level: Needs assistance Equipment used: 1 person hand held assist Transfers: Sit to/from Stand Sit to Stand: Min assist General transfer comment: Min assist for boost and balance, leaning to the left. VC for awareness and to regain balance once upright. Ambulation/Gait Ambulation/Gait assistance: Mod assist Ambulation Distance (Feet): 15 Feet Assistive device: 1 person hand held assist Gait Pattern/deviations: Step-through pattern, Decreased stride length, Decreased weight shift to right, Ataxic, Narrow base of support, Staggering left General Gait Details: Mostly ambulating with min assist hand held support, but occasionally with left sided loss of balance requiring up to mod assist for recovery. No buckling noted. VC for sequencing, weight shift, awareness, and upright stance. Tolerated some dynamic tasks such as high  knees, backwards steps, and turning but required additional assistance with each task. Gait velocity: decreased Gait velocity interpretation: <1.8 ft/sec, indicative of risk for recurrent falls    ADL: ADL Overall ADL's :  Needs assistance/impaired Eating/Feeding: Minimal assistance, Sitting Grooming: Minimal assistance, Sitting Upper Body Bathing: Moderate assistance, Sitting Lower Body Bathing: Maximal assistance, Sit to/from stand Upper Body Dressing : Moderate assistance, Sitting Lower Body Dressing: Maximal assistance, Sit to/from stand Toilet Transfer: Minimal assistance, Ambulation Toilet Transfer Details (indicate cue type and reason): Pt reaching for furniture unsafely. Toileting- Clothing Manipulation and Hygiene: Moderate assistance, Sit to/from stand Functional mobility during ADLs: Minimal assistance General ADL Comments: Pt unsafe during ambulation, reaching for rolling furniture and IV pole for stability and requiring VC's to prevent this to improve safety.   Cognition: Cognition Overall Cognitive Status: Impaired/Different from baseline Orientation Level: Oriented X4 Cognition Arousal/Alertness: Awake/alert Behavior During Therapy: WFL for tasks assessed/performed Overall Cognitive Status: Impaired/Different from baseline Area of Impairment: Safety/judgement Safety/Judgement: Decreased awareness of safety General Comments: Pt with poor safety awareness throughout with decreased understanding of impact of deficits on safety and independence.   Blood pressure (!) 156/96, pulse 84, temperature 97.7 F (36.5 C), temperature source Oral, resp. rate 18, height 5\' 8"  (1.727 m), weight 92.6 kg (204 lb 3.2 oz), SpO2 97 %. Physical Exam  Constitutional: He is oriented to person, place, and time. He appears well-developed.  HENT:  Head: Normocephalic.  Eyes: Pupils are equal, round, and reactive to light.  Neck: Normal range of motion.  Cardiovascular: Normal rate.   Respiratory: Effort normal.  GI: He exhibits no distension.  Musculoskeletal: He exhibits no edema.  Left shoulder pain/weakness due to RTC injury  Neurological: He is alert and oriented to person, place, and time. Coordination  abnormal.  Alert. Left central 7. Speech slightly dysarthric. LUE: 2/5 deltoid, 3/5 biceps, triceps, and wrist/hand. LLE: 3+ to 4/5 HF, KE and ADF/PF. No sensory deficits.     Results for orders placed or performed during the hospital encounter of 12/04/16 (from the past 24 hour(s))  CBC     Status: None   Collection Time: 12/06/16  5:02 AM  Result Value Ref Range   WBC 9.9 4.0 - 10.5 K/uL   RBC 4.94 4.22 - 5.81 MIL/uL   Hemoglobin 15.5 13.0 - 17.0 g/dL   HCT 45.6 39.0 - 52.0 %   MCV 92.3 78.0 - 100.0 fL   MCH 31.4 26.0 - 34.0 pg   MCHC 34.0 30.0 - 36.0 g/dL   RDW 13.0 11.5 - 15.5 %   Platelets 210 150 - 400 K/uL  Basic metabolic panel     Status: Abnormal   Collection Time: 12/06/16  5:02 AM  Result Value Ref Range   Sodium 138 135 - 145 mmol/L   Potassium 3.7 3.5 - 5.1 mmol/L   Chloride 102 101 - 111 mmol/L   CO2 24 22 - 32 mmol/L   Glucose, Bld 112 (H) 65 - 99 mg/dL   BUN 12 6 - 20 mg/dL   Creatinine, Ser 0.95 0.61 - 1.24 mg/dL   Calcium 9.1 8.9 - 10.3 mg/dL   GFR calc non Af Amer >60 >60 mL/min   GFR calc Af Amer >60 >60 mL/min   Anion gap 12 5 - 15   Dg Chest 2 View  Result Date: 12/05/2016 CLINICAL DATA:  No chest complaints. EXAM: CHEST  2 VIEW COMPARISON:  Nov 16, 2012 FINDINGS: Pleural thickening versus tiny pleural effusions, unchanged. The cardiomediastinal silhouette  is stable. The lungs remain clear. IMPRESSION: No interval change. Pleural thickening versus tiny pleural effusions. Electronically Signed   By: Dorise Bullion III M.D   On: 12/05/2016 14:42   Mr Brain Wo Contrast  Result Date: 12/05/2016 CLINICAL DATA:  LEFT-sided numbness and weakness began at 10 a.m. 12/04/2016. Stroke risk factors include hypertension, hyperlipidemia, coronary artery disease, prior TIA, and medication noncompliance. EXAM: MRI HEAD WITHOUT CONTRAST MRA HEAD WITHOUT CONTRAST TECHNIQUE: Multiplanar, multiecho pulse sequences of the brain and surrounding structures were obtained without  intravenous contrast. Angiographic images of the head were obtained using MRA technique without contrast. COMPARISON:  Code stroke CT head 12/04/2016. FINDINGS: MRI HEAD FINDINGS Brain: Restricted diffusion, RIGHT lentiform nucleus extending to the RIGHT centrum semiovale and periventricular white matter, consistent with acute nonhemorrhagic infarction. RIGHT MCA lenticulostriate territory involvement. There is a chronic lacunar infarct involving the RIGHT putamen, identified on CT, without restriction or hemorrhage. Normal for age cerebral volume. Mild subcortical and periventricular T2 and FLAIR hyperintensities, likely chronic microvascular ischemic change. No mass lesion, hydrocephalus, or extra-axial fluid. Vascular: Reported separately. Skull and upper cervical spine: Normal marrow signal. Sinuses/Orbits: Negative. Other: None. Compared with the code stroke CT, the acute infarct is not visible. MRA HEAD FINDINGS The internal carotid arteries are widely patent. No stenosis or dissection. The basilar artery is widely patent. Vertebrals are codominant and widely patent. There is no stenosis of the anterior, middle, or posterior cerebral arteries. Slight irregularity distal MCA and PCA branches suggest intracranial atherosclerotic change. No cerebellar branch occlusion.  No saccular aneurysm. IMPRESSION: Acute RIGHT MCA lenticulostriate territory infarct, nonhemorrhagic affecting the lentiform nucleus and adjacent white matter. Chronic RIGHT basal ganglia lacune. Mild chronic microvascular ischemic change. Negative MRA intracranial circulation. Electronically Signed   By: Staci Righter M.D.   On: 12/05/2016 14:23   Mr Jodene Nam Head/brain TG Cm  Result Date: 12/05/2016 CLINICAL DATA:  LEFT-sided numbness and weakness began at 10 a.m. 12/04/2016. Stroke risk factors include hypertension, hyperlipidemia, coronary artery disease, prior TIA, and medication noncompliance. EXAM: MRI HEAD WITHOUT CONTRAST MRA HEAD WITHOUT  CONTRAST TECHNIQUE: Multiplanar, multiecho pulse sequences of the brain and surrounding structures were obtained without intravenous contrast. Angiographic images of the head were obtained using MRA technique without contrast. COMPARISON:  Code stroke CT head 12/04/2016. FINDINGS: MRI HEAD FINDINGS Brain: Restricted diffusion, RIGHT lentiform nucleus extending to the RIGHT centrum semiovale and periventricular white matter, consistent with acute nonhemorrhagic infarction. RIGHT MCA lenticulostriate territory involvement. There is a chronic lacunar infarct involving the RIGHT putamen, identified on CT, without restriction or hemorrhage. Normal for age cerebral volume. Mild subcortical and periventricular T2 and FLAIR hyperintensities, likely chronic microvascular ischemic change. No mass lesion, hydrocephalus, or extra-axial fluid. Vascular: Reported separately. Skull and upper cervical spine: Normal marrow signal. Sinuses/Orbits: Negative. Other: None. Compared with the code stroke CT, the acute infarct is not visible. MRA HEAD FINDINGS The internal carotid arteries are widely patent. No stenosis or dissection. The basilar artery is widely patent. Vertebrals are codominant and widely patent. There is no stenosis of the anterior, middle, or posterior cerebral arteries. Slight irregularity distal MCA and PCA branches suggest intracranial atherosclerotic change. No cerebellar branch occlusion.  No saccular aneurysm. IMPRESSION: Acute RIGHT MCA lenticulostriate territory infarct, nonhemorrhagic affecting the lentiform nucleus and adjacent white matter. Chronic RIGHT basal ganglia lacune. Mild chronic microvascular ischemic change. Negative MRA intracranial circulation. Electronically Signed   By: Staci Righter M.D.   On: 12/05/2016 14:23   Ct Head Code Stroke  Wo Contrast`  Result Date: 12/04/2016 CLINICAL DATA:  Code stroke. LEFT-sided numbness beginning at 10 a.m., facial droop. History of hypertension,  hyperlipidemia. EXAM: CT HEAD WITHOUT CONTRAST TECHNIQUE: Contiguous axial images were obtained from the base of the skull through the vertex without intravenous contrast. COMPARISON:  None. FINDINGS: BRAIN: 5 mm hypodensity RIGHT putaminal (axial 16/32). No intraparenchymal hemorrhage, mass effect nor midline shift. The ventricles and sulci are normal. No acute large vascular territory infarcts. No abnormal extra-axial fluid collections. Basal cisterns are patent. VASCULAR: Trace calcific atherosclerosis LEFT vertebral artery. SKULL/SOFT TISSUES: No skull fracture. No significant soft tissue swelling. ORBITS/SINUSES: The included ocular globes and orbital contents are normal.Moderate maxillary sinus mucosal thickening. Mastoid air cells are well aerated. OTHER: None. ASPECTS [facility] Stroke Program Early CT Score) - Ganglionic level infarction (caudate, lentiform nuclei, internal capsule, insula, M1-M3 cortex): 6 - Supraganglionic infarction (M4-M6 cortex): 3 Total score (0-10 with 10 being normal): 9 IMPRESSION: 1. Age indeterminate RIGHT basal ganglia lacunar infarct. 2. ASPECTS is 9. Critical Value/emergent results were called by telephone at the time of interpretation on 12/04/2016 at 9:52 pm to Dr. Leonette Monarch, who verbally acknowledged these results. Electronically Signed   By: Elon Alas M.D.   On: 12/04/2016 21:53    Assessment/Plan: Diagnosis: right basal ganglia infarct with left hemiparesis 1. Does the need for close, 24 hr/day medical supervision in concert with the patient's rehab needs make it unreasonable for this patient to be served in a less intensive setting? Yes 2. Co-Morbidities requiring supervision/potential complications: GERD, post -stroke sequelae 3. Due to bladder management, bowel management, safety, skin/wound care, disease management, medication administration, pain management and patient education, does the patient require 24 hr/day rehab nursing? Yes 4. Does the patient  require coordinated care of a physician, rehab nurse, PT (1-2 hrs/day, 5 days/week), OT (1-2 hrs/day, 5 days/week) and SLP (1-2 hrs/day, 5 days/week) to address physical and functional deficits in the context of the above medical diagnosis(es)? Yes Addressing deficits in the following areas: balance, endurance, locomotion, strength, transferring, bowel/bladder control, bathing, dressing, feeding, grooming, toileting, speech and psychosocial support 5. Can the patient actively participate in an intensive therapy program of at least 3 hrs of therapy per day at least 5 days per week? Yes 6. The potential for patient to make measurable gains while on inpatient rehab is excellent 7. Anticipated functional outcomes upon discharge from inpatient rehab are modified independent  with PT, modified independent with OT, modified independent with SLP. 8. Estimated rehab length of stay to reach the above functional goals is: 7 days 9. Anticipated D/C setting: Home 10. Anticipated post D/C treatments: HH therapy and Outpatient therapy 11. Overall Rehab/Functional Prognosis: excellent  RECOMMENDATIONS: This patient's condition is appropriate for continued rehabilitative care in the following setting: CIR Patient has agreed to participate in recommended program. Yes Note that insurance prior authorization may be required for reimbursement for recommended care.  Comment: Rehab Admissions Coordinator to follow up.  Thanks,  Meredith Staggers, MD, Tilford Pillar, PA-C 12/06/2016

## 2016-12-06 NOTE — NC FL2 (Signed)
[place] MEDICAID FL2 LEVEL OF CARE SCREENING TOOL     IDENTIFICATION  Patient Name: Brandon Martin Birthdate: [birth year] Sex: male Admission Date (Current Location): 12/04/2016  [facility] and [place] Number:  Producer, television/film/video and Address:  [facility], [address], [place], [place] [address]      Provider Number: [id number]  Attending Physician Name and Address:  Dorothea Ogle, MD  Relative Name and Phone Number:  Kathie Rhodes, mother, [phone]    Current Level of Care: Hospital Recommended Level of Care: Skilled Nursing Facility Prior Approval Number:    Date Approved/Denied:   PASRR Number: [id number]  Discharge Plan: SNF    Current Diagnoses: Patient Active Problem List   Diagnosis Date Noted  . Stroke (cerebrum) (HCC) 12/04/2016  . Tobacco use disorder   . Coronary artery disease   . GERD (gastroesophageal reflux disease)   . Hyperlipidemia   . Pleural effusion     Orientation RESPIRATION BLADDER Height & Weight     Self, Time, Situation, Place  Normal Continent Weight: 92.6 kg (204 lb 3.2 oz) Height:  5\' 8"  (172.7 cm)  BEHAVIORAL SYMPTOMS/MOOD NEUROLOGICAL BOWEL NUTRITION STATUS      Continent Diet (Please see DC Summary)  AMBULATORY STATUS COMMUNICATION OF NEEDS Skin   Limited Assist Verbally Normal                       Personal Care Assistance Level of Assistance  Bathing, Feeding, Dressing Bathing Assistance: Limited assistance Feeding assistance: Independent Dressing Assistance: Limited assistance     Functional Limitations Info             SPECIAL CARE FACTORS FREQUENCY  PT (By licensed PT), OT (By licensed OT)     PT Frequency: 5x/week OT Frequency: 3x/week            Contractures      Additional Factors Info  Code Status, Allergies Code Status Info: Full Allergies Info: Aspirin, Morphine And Related, Latex           Current Medications (12/06/2016):  This is the current hospital active  medication list Current Facility-Administered Medications  Medication Dose Route Frequency Provider Last Rate Last Dose  . acetaminophen (TYLENOL) tablet 650 mg  650 mg Oral Q4H PRN Inez Catalina, MD       Or  . acetaminophen (TYLENOL) solution 650 mg  650 mg Per Tube Q4H PRN Inez Catalina, MD       Or  . acetaminophen (TYLENOL) suppository 650 mg  650 mg Rectal Q4H PRN Debe Coder B, MD      . aspirin suppository 300 mg  300 mg Rectal Daily Debe Coder B, MD       Or  . aspirin tablet 325 mg  325 mg Oral Daily Debe Coder B, MD   325 mg at 12/06/16 0919  . atorvastatin (LIPITOR) tablet 20 mg  20 mg Oral q1800 Dorothea Ogle, MD   20 mg at 12/05/16 1804  . enoxaparin (LOVENOX) injection 40 mg  40 mg Subcutaneous Q24H Debe Coder B, MD   40 mg at 12/06/16 0919  . famotidine (PEPCID) tablet 20 mg  20 mg Oral Daily Dorothea Ogle, MD   20 mg at 12/06/16 1000  . senna-docusate (Senokot-S) tablet 1 tablet  1 tablet Oral QHS PRN Inez Catalina, MD      . traMADol Janean Sark) tablet 50 mg  50 mg Oral Q6H  PRN Dorothea Ogle, MD         Discharge Medications: Please see discharge summary for a list of discharge medications.  Relevant Imaging Results:  Relevant Lab Results:   Additional Information SSN: [id number] [place], [place]

## 2016-12-06 NOTE — Progress Notes (Signed)
STROKE TEAM PROGRESS NOTE   SUBJECTIVE (INTERVAL HISTORY) Patient states his improving left-sided strength is better. He states his tried quitting smoking unsuccessfully in the past.   OBJECTIVE Temp:  [97.6 F (36.4 C)-97.8 F (36.6 C)] 97.7 F (36.5 C) (05/28 0519) Pulse Rate:  [84-96] 84 (05/28 0519) Cardiac Rhythm: Normal sinus rhythm;Bundle branch block (05/28 0700) Resp:  [18] 18 (05/28 0519) BP: (150-173)/(90-110) 156/96 (05/28 0519) SpO2:  [96 %-97 %] 97 % (05/28 0519)  CBC:  Recent Labs Lab 12/04/16 2126  12/05/16 0138 12/06/16 0502  WBC 10.9*  --  8.9 9.9  NEUTROABS 6.0  --   --   --   HGB 16.1  < > 15.6 15.5  HCT 45.9  < > 45.5 45.6  MCV 90.9  --  91.0 92.3  PLT 239  --  217 210  < > = values in this interval not displayed.  Basic Metabolic Panel:  Recent Labs Lab 12/04/16 2126 12/04/16 2137 12/05/16 0138 12/06/16 0502  NA 138 135  --  138  K 3.5 3.5  --  3.7  CL 104 102  --  102  CO2 22  --   --  24  GLUCOSE 117* 114*  --  112*  BUN 16 17  --  12  CREATININE 0.97 1.00 0.93 0.95  CALCIUM 9.6  --   --  9.1   HgbA1c:  Lab Results  Component Value Date   HGBA1C 5.8 (H) 11/17/2012    PHYSICAL EXAM Pleasant middle aged male not in distress. . Afebrile. Head is nontraumatic. Neck is supple without bruit.    Cardiac exam no murmur or gallop. Lungs are clear to auscultation. Distal pulses are well felt. Neurological Exam :  Awake alert oriented x 3 normal speech and language. Mild left lower face asymmetry. Tongue midline. Mild LUE drift. Mild weakness of left grip and diminished fine finger movements on left. Orbits right over left upper extremity. Mild left hip flexor weakness.. Slightly diminished left hemibody sensation . Normal coordination. Gait deferred ASSESSMENT/PLAN Mr. BUNNY LOWDERMILK is a 51 y.o. male with history of HTN, CAD s/p CABG, HLD, gastric ulcers presenting with L sided weakness, facial droop, slurred speech, L hemisensory deficit,  unsteady gait and blurred vision. He did not receive IV t-PA.  Stroke:   R MCA lenticulostriate infarct  secondary to small vessel disease    Resultant  L HP, L hemisensory deficit  Code Stroke CT old R BG lacune. Aspects 9.  MRI  R MCA lenticulostriate infarct. Old R BG lacune. Small vessel disease.   MRA  Unremarkable   Carotid Doppler  pending   2D Echo  pending   LDL 124  HgbA1c pending  Lovenox 40 mg sq daily for VTE prophylaxis  DIET SOFT Room service appropriate? Yes; Fluid consistency: Thin  No antithrombotic prior to admission though he did take a chewable aspirin prior to coming to the ED, now on Plavix 75 mg daily  Therapy recommendations:  CIR  Disposition:  pending   Hypertension  Stable Permissive hypertension (OK if < 220/120) but gradually normalize in 5-7 days Long-term BP goal normotensive  Hyperlipidemia  Home meds:  No statin   LDL 124 goal  Add statin  Continue statin at discharge  Other Stroke Risk Factors  Cigarette smoker, advised to stop smoking  UDS / ETOH level not performed   Obesity, Body mass index is 31.05 kg/m.  Family Hx TIA (sister)  Coronary artery disease s/p  CABG  Hospital day # 2  I have personally examined this patient, reviewed notes, independently viewed imaging studies, participated in medical decision making and plan of care.ROS completed by me personally and pertinent positives fully documented  I have made any additions or clarifications directly to the above note. The patient presented with left hemiparesis secondary to small right brain subcortical infarct from small vessel disease. Recommend changing aspirin to Plavix for stroke prevention. Patient counseled to quit smoking. Maintain aggressive risk factor modification. Consider rehabilitation consult. Greater than 50% time during this 35 minute visit was spent on counseling and coordination of care about his stroke, need for risk factor modification and  answered questions.  Antony Contras, MD Medical Director [place] Pager: [phone] 12/06/2016 3:12 PM   To contact Stroke Continuity provider, please refer to [email or web address] After hours, contact General Neurology

## 2016-12-06 NOTE — Progress Notes (Signed)
Physical Therapy Treatment Patient Details Name: Brandon Martin MRN: [id number] DOB: Nov 04, 1965 Today's Date: 12/06/2016    History of Present Illness Pt is a 51 y.o. male with medical history significant for HTN, CAD s/p CABGx3. gastric ulcers, and HLD. He presented to the ED with L sided weakness, L facial droop, and slurring of speech. CT reveals age indeterminate R basal ganglia lacunar infarct.     PT Comments    Pt progressing towards goals. Tolerated increased gait training and exercise this session. Pt continues to be very unsteady with gait and demonstrates decreased safety awareness. Required multiple safety cues throughout session to look before sitting and for safety during gait. Pt with LOB to L during gait requiring mod A for recovery. Pt very motivated and participatory in therapy this session. Would continue to be excellent candidate for CIR given current deficits and motivation to reach independence. Will continue to follow acutely and progress mobility.     Follow Up Recommendations  CIR     Equipment Recommendations  Other (comment) (TBD)    Recommendations for Other Services Rehab consult     Precautions / Restrictions Precautions Precautions: Fall Restrictions Weight Bearing Restrictions: No    Mobility  Bed Mobility Overal bed mobility: Needs Assistance Bed Mobility: Supine to Sit     Supine to sit: Min assist     General bed mobility comments: Min A for trunk elevation using HHA.   Transfers Overall transfer level: Needs assistance Equipment used: 1 person hand held assist;Rolling walker (2 wheeled) Transfers: Sit to/from Stand Sit to Stand: Min assist         General transfer comment: Min A for balance and lift assist. Pt continuing to lose balance to L requiring verbal cues and manual assist for maintaining balance.   Ambulation/Gait Ambulation/Gait assistance: Mod assist Ambulation Distance (Feet): 20 Feet Assistive device: 1 person hand  held assist;Rolling walker (2 wheeled) Gait Pattern/deviations: Step-through pattern;Decreased stride length;Decreased weight shift to right;Ataxic;Narrow base of support;Staggering left Gait velocity: decreased Gait velocity interpretation: <1.8 ft/sec, indicative of risk for recurrent falls General Gait Details: 20' X 2 once with HHA, and once with RW. With HHA, pt with unstable gait, requiring up to mod A to maintain balance. Pt with narrow BOS and LOB noted to L X 3, requiring mod A to prevent fall. Attempted gait with RW and manual assist to maintain LUE on RW; increased stability, however, pt continuing to exhibit decreased balance and LOB X 1 to L. Verbal cues for upright posture.   Stairs            Wheelchair Mobility    Modified Rankin (Stroke Patients Only) Modified Rankin (Stroke Patients Only) Pre-Morbid Rankin Score: No symptoms Modified Rankin: Moderately severe disability     Balance Overall balance assessment: Needs assistance Sitting-balance support: No upper extremity supported;Feet supported Sitting balance-Leahy Scale: Fair   Postural control: Left lateral lean Standing balance support: Single extremity supported;Bilateral upper extremity supported;During functional activity Standing balance-Leahy Scale: Poor Standing balance comment: Reliant on UE support and external assistance.                             Cognition Arousal/Alertness: Awake/alert Behavior During Therapy: WFL for tasks assessed/performed;Impulsive Overall Cognitive Status: Impaired/Different from baseline Area of Impairment: Safety/judgement                         Safety/Judgement: Decreased awareness  of safety     General Comments: Pt with decreased safety awareness, and demonstrating impulsivity with movement; i.e. sitting down before knowing if there is a chair behind him, moving without PT.       Exercises General Exercises - Lower Extremity Ankle  Circles/Pumps: AROM;Both;10 reps;Seated Long Arc Quad: AROM;Both;10 reps;Seated Hip Flexion/Marching: AROM;Both;10 reps;Seated Other Exercises Other Exercises: Weightshifting to LUE X 10; manual assist to maintain elbow extension.  Other Exercises: AAROM, shoulder flexion to shoulder height; X 10, ROM limited by pain.  Other Exercises: Shoulder blade squeezes; X 10; verbal cues to relax shoulders down.  Other Exercises: L Elbow flexion; AAROM X 10; verbal cues for L shoulder relaxation during task.  Other Exercises: Elbow props X 10; Min A; verbal cues for technique.     General Comments General comments (skin integrity, edema, etc.): Educated pt about importance of protecting RUE and placing in lap when sitting. Verbal cues for performance of technique throughout session.       Pertinent Vitals/Pain Pain Assessment: Faces Faces Pain Scale: Hurts even more Pain Location: L shoulder with AAROM  Pain Descriptors / Indicators: Sharp Pain Intervention(s): Limited activity within patient's tolerance;Monitored during session;Repositioned    Home Living                      Prior Function            PT Goals (current goals can now be found in the care plan section) Acute Rehab PT Goals Patient Stated Goal: Go to rehab if insurance will pay for it  PT Goal Formulation: With patient Time For Goal Achievement: 12/19/16 Potential to Achieve Goals: Good Progress towards PT goals: Progressing toward goals    Frequency    Min 4X/week      PT Plan Current plan remains appropriate    Co-evaluation              AM-PAC PT "6 Clicks" Daily Activity  Outcome Measure  Difficulty turning over in bed (including adjusting bedclothes, sheets and blankets)?: A Little Difficulty moving from lying on back to sitting on the side of the bed? : Total Difficulty sitting down on and standing up from a chair with arms (e.g., wheelchair, bedside commode, etc,.)?: Total Help needed  moving to and from a bed to chair (including a wheelchair)?: A Lot Help needed walking in hospital room?: A Lot Help needed climbing 3-5 steps with a railing? : A Lot 6 Click Score: 11    End of Session Equipment Utilized During Treatment: Gait belt Activity Tolerance: Patient tolerated treatment well Patient left: in chair;Other (comment) (with transport staff ) Nurse Communication: Mobility status PT Visit Diagnosis: Ataxic gait (R26.0);Hemiplegia and hemiparesis Hemiplegia - Right/Left: Left Hemiplegia - dominant/non-dominant: Non-dominant Hemiplegia - caused by: Cerebral infarction     Time: 4098-1191 PT Time Calculation (min) (ACUTE ONLY): 20 min  Charges:  $Therapeutic Exercise: 8-22 mins                    G Codes:       Margot Chimes, PT, DPT  Acute Rehabilitation Services  Pager: [phone]    Melvyn Novas 12/06/2016, 4:15 PM

## 2016-12-06 NOTE — Plan of Care (Signed)
Problem: Safety: Goal: Ability to remain free from injury will improve Outcome: Progressing Safety precautions and fall prevention maintained  Problem: Pain Managment: Goal: General experience of comfort will improve Outcome: Progressing Denies pain and discomfort  Problem: Skin Integrity: Goal: Risk for impaired skin integrity will decrease Outcome: Progressing No skin issues noted  Problem: Tissue Perfusion: Goal: Risk factors for ineffective tissue perfusion will decrease Outcome: Progressing On Lovenox prophylaxis, no s/s of dvt noted  Problem: Activity: Goal: Risk for activity intolerance will decrease Outcome: Progressing OOB to chair and back to bed with moderate assistance, tolerated well  Problem: Bowel/Gastric: Goal: Will not experience complications related to bowel motility Outcome: Progressing Denies gastric and bowel issues, last BM 12/05/2016

## 2016-12-06 NOTE — Progress Notes (Addendum)
VASCULAR LAB PRELIMINARY  PRELIMINARY  PRELIMINARY  PRELIMINARY  Carotid duplex completed.    Preliminary report:  Bilateral - No evidence of significant extracranial stenosis.  Bilateral - Vertebral artery flow is antegrade.  [place], RVS 12/06/2016, 4:13 PM

## 2016-12-07 ENCOUNTER — Encounter (HOSPITAL_COMMUNITY): Payer: Self-pay | Admitting: *Deleted

## 2016-12-07 ENCOUNTER — Inpatient Hospital Stay (HOSPITAL_COMMUNITY): Payer: Medicare Other

## 2016-12-07 ENCOUNTER — Inpatient Hospital Stay (HOSPITAL_COMMUNITY)
Admission: RE | Admit: 2016-12-07 | Discharge: 2016-12-16 | DRG: 092 | Disposition: A | Discharge status: Home / Self Care | Payer: Medicare Other | Source: Intra-hospital | Attending: Physical Medicine & Rehabilitation | Admitting: Physical Medicine & Rehabilitation

## 2016-12-07 DIAGNOSIS — Z716 Tobacco abuse counseling: Secondary | ICD-10-CM | POA: Diagnosis not present

## 2016-12-07 DIAGNOSIS — Z79899 Other long term (current) drug therapy: Secondary | ICD-10-CM

## 2016-12-07 DIAGNOSIS — Z951 Presence of aortocoronary bypass graft: Secondary | ICD-10-CM

## 2016-12-07 DIAGNOSIS — I69322 Dysarthria following cerebral infarction: Secondary | ICD-10-CM

## 2016-12-07 DIAGNOSIS — M1712 Unilateral primary osteoarthritis, left knee: Secondary | ICD-10-CM | POA: Diagnosis present

## 2016-12-07 DIAGNOSIS — I69354 Hemiplegia and hemiparesis following cerebral infarction affecting left non-dominant side: Secondary | ICD-10-CM

## 2016-12-07 DIAGNOSIS — Z885 Allergy status to narcotic agent status: Secondary | ICD-10-CM

## 2016-12-07 DIAGNOSIS — Z8249 Family history of ischemic heart disease and other diseases of the circulatory system: Secondary | ICD-10-CM | POA: Diagnosis not present

## 2016-12-07 DIAGNOSIS — Z91040 Latex allergy status: Secondary | ICD-10-CM | POA: Diagnosis not present

## 2016-12-07 DIAGNOSIS — I2510 Atherosclerotic heart disease of native coronary artery without angina pectoris: Secondary | ICD-10-CM | POA: Diagnosis present

## 2016-12-07 DIAGNOSIS — I10 Essential (primary) hypertension: Secondary | ICD-10-CM | POA: Diagnosis present

## 2016-12-07 DIAGNOSIS — K279 Peptic ulcer, site unspecified, unspecified as acute or chronic, without hemorrhage or perforation: Secondary | ICD-10-CM | POA: Diagnosis present

## 2016-12-07 DIAGNOSIS — G8194 Hemiplegia, unspecified affecting left nondominant side: Secondary | ICD-10-CM | POA: Diagnosis not present

## 2016-12-07 DIAGNOSIS — I63311 Cerebral infarction due to thrombosis of right middle cerebral artery: Secondary | ICD-10-CM | POA: Diagnosis not present

## 2016-12-07 DIAGNOSIS — F17210 Nicotine dependence, cigarettes, uncomplicated: Secondary | ICD-10-CM | POA: Diagnosis present

## 2016-12-07 DIAGNOSIS — I6789 Other cerebrovascular disease: Secondary | ICD-10-CM

## 2016-12-07 DIAGNOSIS — I69392 Facial weakness following cerebral infarction: Secondary | ICD-10-CM | POA: Diagnosis not present

## 2016-12-07 DIAGNOSIS — R2689 Other abnormalities of gait and mobility: Principal | ICD-10-CM | POA: Diagnosis present

## 2016-12-07 DIAGNOSIS — K219 Gastro-esophageal reflux disease without esophagitis: Secondary | ICD-10-CM | POA: Diagnosis present

## 2016-12-07 DIAGNOSIS — Z886 Allergy status to analgesic agent status: Secondary | ICD-10-CM

## 2016-12-07 DIAGNOSIS — I639 Cerebral infarction, unspecified: Secondary | ICD-10-CM | POA: Diagnosis present

## 2016-12-07 DIAGNOSIS — E785 Hyperlipidemia, unspecified: Secondary | ICD-10-CM | POA: Diagnosis present

## 2016-12-07 DIAGNOSIS — M25562 Pain in left knee: Secondary | ICD-10-CM

## 2016-12-07 LAB — VAS US CAROTID
LCCADDIAS: -16 cm/s
LEFT ECA DIAS: -13 cm/s
LEFT VERTEBRAL DIAS: -9 cm/s
LICADDIAS: -14 cm/s
LICAPDIAS: -18 cm/s
LICAPSYS: -51 cm/s
Left CCA dist sys: -78 cm/s
Left CCA prox dias: 13 cm/s
Left CCA prox sys: 127 cm/s
Left ICA dist sys: -38 cm/s
RIGHT ECA DIAS: -18 cm/s
RIGHT VERTEBRAL DIAS: -11 cm/s
Right CCA prox dias: 19 cm/s
Right CCA prox sys: 89 cm/s
Right cca dist sys: -61 cm/s

## 2016-12-07 LAB — HEMOGLOBIN A1C
HEMOGLOBIN A1C: 6 % — AB (ref 4.8–5.6)
Mean Plasma Glucose: 126 mg/dL

## 2016-12-07 LAB — ECHOCARDIOGRAM COMPLETE
HEIGHTINCHES: 68 in
WEIGHTICAEL: 3267.2 [oz_av]

## 2016-12-07 MED ORDER — ONDANSETRON HCL 4 MG PO TABS
4.0000 mg | ORAL_TABLET | Freq: Four times a day (QID) | ORAL | Status: DC | PRN
  Filled 2016-12-07: qty 1

## 2016-12-07 MED ORDER — TRAZODONE HCL 50 MG PO TABS: 50.0000 mg | ORAL_TABLET | Freq: Every evening | ORAL | 1 refills | Status: DC | PRN

## 2016-12-07 MED ORDER — ATORVASTATIN CALCIUM 20 MG PO TABS: 20.0000 mg | ORAL_TABLET | Freq: Every day | ORAL | 1 refills | Status: DC

## 2016-12-07 MED ORDER — TRAMADOL HCL 50 MG PO TABS: 50.0000 mg | ORAL_TABLET | Freq: Four times a day (QID) | ORAL | 0 refills | Status: DC | PRN

## 2016-12-07 MED ORDER — CLOPIDOGREL BISULFATE 75 MG PO TABS: 75.0000 mg | ORAL_TABLET | Freq: Every day | ORAL | 1 refills | Status: DC

## 2016-12-07 MED ORDER — ACETAMINOPHEN 160 MG/5ML PO SOLN
650.0000 mg | ORAL | Status: DC | PRN
  Administered 2016-12-13: 650 mg

## 2016-12-07 MED ORDER — AMLODIPINE BESYLATE 5 MG PO TABS: 5.0000 mg | ORAL_TABLET | Freq: Every day | ORAL | Status: DC

## 2016-12-07 MED ORDER — SORBITOL 70 % SOLN
30.0000 mL | Freq: Every day | Status: DC | PRN
  Administered 2016-12-07 – 2016-12-14 (×4): 30 mL via ORAL
  Filled 2016-12-07 (×5): qty 30

## 2016-12-07 MED ORDER — ENOXAPARIN SODIUM 40 MG/0.4ML ~~LOC~~ SOLN: 40.0000 mg | SUBCUTANEOUS | Status: DC

## 2016-12-07 MED ORDER — ACETAMINOPHEN 325 MG PO TABS
650.0000 mg | ORAL_TABLET | ORAL | Status: DC | PRN
  Administered 2016-12-12 – 2016-12-15 (×5): 650 mg via ORAL
  Filled 2016-12-07 (×7): qty 2

## 2016-12-07 MED ORDER — TRAZODONE HCL 50 MG PO TABS
50.0000 mg | ORAL_TABLET | Freq: Every evening | ORAL | Status: DC | PRN
  Administered 2016-12-09 – 2016-12-15 (×5): 50 mg via ORAL
  Filled 2016-12-07 (×5): qty 1

## 2016-12-07 MED ORDER — ATORVASTATIN CALCIUM 20 MG PO TABS
20.0000 mg | ORAL_TABLET | Freq: Every day | ORAL | Status: DC
  Administered 2016-12-07 – 2016-12-15 (×9): 20 mg via ORAL
  Filled 2016-12-07 (×9): qty 1

## 2016-12-07 MED ORDER — PERFLUTREN LIPID MICROSPHERE
1.0000 mL | INTRAVENOUS | Status: AC | PRN
  Administered 2016-12-07: 2 mL via INTRAVENOUS
  Filled 2016-12-07: qty 10

## 2016-12-07 MED ORDER — FAMOTIDINE 20 MG PO TABS
20.0000 mg | ORAL_TABLET | Freq: Every day | ORAL | Status: DC
  Administered 2016-12-08 – 2016-12-16 (×9): 20 mg via ORAL
  Filled 2016-12-07 (×5): qty 1
  Filled 2016-12-07: qty 2
  Filled 2016-12-07 (×4): qty 1

## 2016-12-07 MED ORDER — CLOPIDOGREL BISULFATE 75 MG PO TABS
75.0000 mg | ORAL_TABLET | Freq: Every day | ORAL | Status: DC
  Administered 2016-12-08 – 2016-12-16 (×9): 75 mg via ORAL
  Filled 2016-12-07 (×9): qty 1

## 2016-12-07 MED ORDER — AMLODIPINE BESYLATE 5 MG PO TABS: 5.0000 mg | ORAL_TABLET | Freq: Every day | ORAL | 1 refills | Status: DC

## 2016-12-07 MED ORDER — TRAMADOL HCL 50 MG PO TABS
50.0000 mg | ORAL_TABLET | Freq: Four times a day (QID) | ORAL | Status: DC | PRN
  Administered 2016-12-07 – 2016-12-16 (×15): 50 mg via ORAL
  Filled 2016-12-07 (×15): qty 1

## 2016-12-07 MED ORDER — ENOXAPARIN SODIUM 40 MG/0.4ML ~~LOC~~ SOLN
40.0000 mg | SUBCUTANEOUS | Status: DC
  Administered 2016-12-08 – 2016-12-15 (×8): 40 mg via SUBCUTANEOUS
  Filled 2016-12-07 (×9): qty 0.4

## 2016-12-07 MED ORDER — ONDANSETRON HCL 4 MG/2ML IJ SOLN: 4.0000 mg | Freq: Four times a day (QID) | INTRAMUSCULAR | Status: DC | PRN

## 2016-12-07 MED ORDER — AMLODIPINE BESYLATE 5 MG PO TABS
5.0000 mg | ORAL_TABLET | Freq: Every day | ORAL | Status: DC
  Administered 2016-12-07: 5 mg via ORAL
  Filled 2016-12-07: qty 1

## 2016-12-07 MED ORDER — ACETAMINOPHEN 650 MG RE SUPP: 650.0000 mg | RECTAL | Status: DC | PRN

## 2016-12-07 NOTE — Progress Notes (Signed)
Report given to chelsea,RN in inpatient rehab.

## 2016-12-07 NOTE — Discharge Instructions (Signed)
Ischemic Stroke °An ischemic stroke (cerebrovascular accident, or CVA) is the sudden death of brain tissue that occurs when an area of the brain does not get enough oxygen. It is a medical emergency that must be treated right away. An ischemic stroke can cause permanent loss of brain function. This can cause problems with how different parts of your body function. °What are the causes? °This condition is caused by a decrease of oxygen supply to an area of the brain, which may be the result of: °· A small blood clot (embolus) or a buildup of plaque in the blood vessels (atherosclerosis) that blocks blood flow in the brain. °· An abnormal heart rhythm (atrial fibrillation). °· A blocked or damaged artery in the head or neck. °What increases the risk? °Certain factors may make you more likely to develop this condition. Some of these factors are things that you can change, such as: °· Obesity. °· Smoking cigarettes. °· Taking oral birth control, especially if you also use tobacco. °· Physical inactivity. °· Excessive alcohol use. °· Use of illegal drugs, especially cocaine and methamphetamine. °Other risk factors include: °· High blood pressure (hypertension). °· High cholesterol. °· Diabetes mellitus. °· Heart disease. °· Being African American, Native American, Hispanic, or [place] Native. °· Being over age 60. °· Family history of stroke. °· Previous history of blood clots, stroke, or transient ischemic attack (TIA). °· Sickle cell disease. °· Being a woman with a history of preeclampsia. °· Migraine headache. °· Sleep apnea. °· Irregular heartbeats, such as atrial fibrillation. °· Chronic inflammatory diseases, such as rheumatoid arthritis or lupus. °· Blood clotting disorders (hypercoagulable state). °What are the signs or symptoms? °Symptoms of this condition usually develop suddenly, or you may notice them after waking up from sleep. Symptoms may include sudden: °· Weakness or numbness in your face, arm, or leg,  especially on one side of your body. °· Trouble walking or difficulty moving your arms or legs. °· Loss of balance or coordination. °· Confusion. °· Slurred speech (dysarthria). °· Trouble speaking, understanding speech, or both (aphasia). °· Vision changes--such as double vision, blurred vision, or loss of vision--in one or both eyes. °· Dizziness. °· Nausea and vomiting. °· Severe headache with no known cause. The headache is often described as the worst headache ever experienced. °If possible, make note of the exact time that you last felt like your normal self and what time your symptoms started. Tell your health care provider. °If symptoms come and go, this could be a sign of a warning stroke, or TIA. Get help right away, even if you feel better. °How is this diagnosed? °This condition may be diagnosed based on: °· Your symptoms, your medical history, and a physical exam. °· CT scan of the brain. °· MRI. °· CT angiogram. This test uses a computer to take X-rays of your arteries. A dye may be injected into your blood to show the inside of your blood vessels more clearly. °· MRI angiogram. This is a type of MRI that is used to evaluate the blood vessels. °· Cerebral angiogram. This test uses X-rays and a dye to show the blood vessels in the brain and neck. °You may need to see a health care provider who specializes in stroke care. A stroke specialist can be seen in person or through communication using telephone or television technology (telemedicine). °Other tests may also be done to find the cause of the stroke, such as: °· Electrocardiogram (ECG). °· Continuous heart monitoring. °· Echocardiogram. °·   Carotid ultrasound. °· A scan of the brain circulation. °· Blood tests. °· Sleep study to check for sleep apnea. °How is this treated? °Treatment for this condition will depend on the duration, severity, and cause of your symptoms and on the area of the brain affected. It is very important to get treatment at the  first sign of stroke symptoms. Some treatments work better if they are done within 3-6 hours of the onset of stroke symptoms. These initial treatments may include: °· Aspirin. °· Medicines to control blood pressure. °· Medicine given by injection to dissolve the blood clot (thrombolytic). °· Treatments given directly to the affected artery to remove or dissolve the blood clot. °Other treatment options may include: °· Oxygen. °· IV fluids. °· Medicines to thin the blood (anticoagulants or antiplatelets). °· Procedures to increase blood flow. °Medicines and changes to your diet may be used to help treat and manage risk factors for stroke, such as diabetes, high cholesterol, and high blood pressure. °After a stroke, you may work with physical, speech, mental health, or occupational therapists to help you recover. °Follow these instructions at home: °Medicines  °· Take over-the-counter and prescription medicines only as told by your health care provider. °· If you were told to take a medicine to thin your blood, such as aspirin or an anticoagulant, take it exactly as told by your health care provider. °¨ Taking too much blood-thinning medicine can cause bleeding. °¨ If you do not take enough blood-thinning medicine, you will not have the protection that you need against another stroke and other problems. °· Understand the side effects of taking anticoagulant medicine. When taking this type of medicine, make sure you: °¨ Hold pressure over any cuts for longer than usual. °¨ Tell your dentist and other health care providers that you are taking anticoagulants before you have any procedures that may cause bleeding. °¨ Avoid activities that may cause trauma or injury. °Eating and drinking  °· Follow instructions from your health care provider about diet. °· Eat healthy foods. °· If your ability to swallow was affected by the stroke, you may need to take steps to avoid choking, such as: °¨ Taking small bites when  eating. °¨ Eating foods that are soft or pureed. °Safety  °· Follow instructions from your health care team about physical activity. °· Use a walker or cane as told by your health care provider. °· Take steps to create a safe home environment in order to reduce the risk of falls. This may include: °¨ Having your home looked at by specialists. °¨ Installing grab bars in the bedroom and bathroom. °¨ Using safety equipment, such as raised toilets and a seat in the shower. °General instructions  °· Do not use any tobacco products, such as cigarettes, chewing tobacco, and e-cigarettes. If you need help quitting, ask your health care provider. °· Limit alcohol intake to no more than 1 drink a day for nonpregnant women and 2 drinks a day for men. One drink equals 12 oz of beer, 5 oz of wine, or 1½ oz of hard liquor. °· If you need help to stop using drugs or alcohol, ask your health care provider about a referral to a program or specialist. °· Maintain an active and healthy lifestyle. Get regular exercise as told by your health care provider. °· Keep all follow-up visits as told by your health care provider, including visits with all specialists on your health care team. This is important. °How is this prevented? °Your   risk of another stroke can be decreased by managing high blood pressure, high cholesterol, diabetes, heart disease, sleep apnea, and obesity. It can also be decreased by quitting smoking, limiting alcohol, and staying physically active. °Your health care provider will continue to work with you on measures to prevent short-term and long-term complications of stroke. °Get help right away if: °You have: °· Sudden weakness or numbness in your face, arm, or leg, especially on one side of your body. °· Sudden confusion. °· Sudden trouble speaking, understanding, or both (aphasia). °· Sudden trouble seeing with one or both eyes. °· Sudden trouble walking or difficulty moving your arms or legs. °· Sudden  dizziness. °· Sudden loss of balance or coordination. °· Sudden, severe headache with no known cause. °· A partial or total loss of consciousness. °· A seizure. °Any of these symptoms may represent a serious problem that is an emergency. Do not wait to see if the symptoms will go away. Get medical help right away. Call your local emergency services (911 in [place] Do not drive yourself to the hospital. °This information is not intended to replace advice given to you by your health care provider. Make sure you discuss any questions you have with your health care provider. °Document Released: 06/28/2005 Document Revised: 12/09/2015 Document Reviewed: 09/24/2015 °[employer] Interactive Patient Education © 2017 [employer] ° °

## 2016-12-07 NOTE — H&P (Signed)
Physical Medicine and Rehabilitation Admission H&P       Chief Complaint  Patient presents with  . Numbness  : HPI: Brandon Mcgath Mabeis a 51 y.o.malewith history of CAD s/p CABG, [name], gastric ulcers, tobacco use.Per chart review patient lives with brother independent prior to admission. One level home. Assistance can be provided as needed. Admitted on 12/04/16 with LUE weakness progressing to LLE weakness, left facial droop and slurred speech as day progressed. CT MRI showed acute right MCA lenticulostriate territory infarct, nonhemorrhagic affecting the lentiform nucleus  and adjacent white matter. Chronic right basal ganglia lacunar infarct. MRA negative. Patient did not receive TPA. Echocardiogram with ejection fraction of 55% grade 1 diastolic dysfunction. Carotid Doppler no ICA stenosis. Neurology consulted placed on Plavix for CVA prophylaxis. Tolerating a mechanical soft diet. Physical occupational therapy evaluations completed with recommendations of physical medicine rehabilitation consult.  Review of Systems  Constitutional: Negative for chills and fever.  HENT: Negative for hearing loss.   Eyes: Negative for blurred vision and double vision.  Respiratory: Positive for shortness of breath. Negative for cough.   Cardiovascular: Negative for chest pain and palpitations.  Gastrointestinal: Positive for constipation. Negative for nausea.       GERD  Genitourinary: Positive for urgency. Negative for dysuria, flank pain and hematuria.  Musculoskeletal: Positive for myalgias.  Skin: Negative for rash.  Neurological: Positive for speech change and focal weakness. Negative for seizures.  All other systems reviewed and are negative.      Past Medical History:  Diagnosis Date  . Coronary artery disease   . GERD (gastroesophageal reflux disease)   . Hyperlipidemia   . Hypertension   . Pleural effusion   . Tobacco use disorder         Past Surgical  History:  Procedure Laterality Date  . CORONARY ARTERY BYPASS GRAFT  x 3        Family History  Problem Relation Age of Onset  . Hypertension Mother   . Arthritis Mother   . Cancer Father   . Transient ischemic attack Sister    Social History:  reports that he has been smoking Cigarettes.  He has quit using smokeless tobacco. He reports that he does not drink alcohol or use drugs. Allergies:       Allergies  Allergen Reactions  . Aspirin Other (See Comments)    HX of gastric ulcers  . Morphine And Related Nausea And Vomiting  . Latex Rash         Medications Prior to Admission  Medication Sig Dispense Refill  . metoprolol tartrate (LOPRESSOR) 25 MG tablet Take 1 tablet (25 mg total) by mouth 2 (two) times daily. 60 tablet 1  . nitroGLYCERIN (NITROSTAT) 0.4 MG SL tablet Place 1 tablet (0.4 mg total) under the tongue every 5 (five) minutes as needed for chest pain. (Patient not taking: Reported on 12/04/2016) 25 tablet 11    Home: Home Living Family/patient expects to be discharged to:: Private residence Living Arrangements: Parent, Other relatives (brother) Available Help at Discharge: Family, Available 24 hours/day Type of Home: House Home Access: Stairs to enter [employer] of Steps: 2 steps down Entrance Stairs-Rails: None Home Layout: One level Bathroom Shower/Tub: Chiropodist: Standard Bathroom Accessibility: Yes Home Equipment: Industrial/product designer History: Prior Function Level of Independence: Independent  Functional Status:  Mobility: Bed Mobility Overal bed mobility: Needs Assistance Bed Mobility: Supine to Sit Supine to sit: Min  assist General bed mobility comments: pt in chair Transfers Overall transfer level: Needs assistance Equipment used: 1 person hand held assist Transfers: Sit to/from Stand Sit to Stand: Min assist General transfer comment: required 2 attempts, assist for  balance Ambulation/Gait Ambulation/Gait assistance: Mod assist Ambulation Distance (Feet): 20 Feet Assistive device: 1 person hand held assist, Rolling walker (2 wheeled) Gait Pattern/deviations: Step-through pattern, Decreased stride length, Decreased weight shift to right, Ataxic, Narrow base of support, Staggering left General Gait Details: 20' X 2 once with HHA, and once with RW. With HHA, pt with unstable gait, requiring up to mod A to maintain balance. Pt with narrow BOS and LOB noted to L X 3, requiring mod A to prevent fall. Attempted gait with RW and manual assist to maintain LUE on RW; increased stability, however, pt continuing to exhibit decreased balance and LOB X 1 to L. Verbal cues for upright posture. Gait velocity: decreased Gait velocity interpretation: <1.8 ft/sec, indicative of risk for recurrent falls  ADL: ADL Overall ADL's : Needs assistance/impaired Eating/Feeding: Minimal assistance, Sitting Grooming: Minimal assistance, Sitting, Wash/dry hands, Wash/dry face Upper Body Bathing: Minimal assistance, Sitting Lower Body Bathing: Minimal assistance, Sit to/from stand, Sitting/lateral leans Upper Body Dressing : Minimal assistance, Sitting Lower Body Dressing: Minimal assistance, Sit to/from stand Toilet Transfer: Minimal assistance, Ambulation Toilet Transfer Details (indicate cue type and reason): Pt reaching for furniture unsafely. Toileting- Clothing Manipulation and Hygiene: Minimal assistance, Sit to/from stand Functional mobility during ADLs: Minimal assistance General ADL Comments: pt performed sponge bathing and dressing seated in chair, leaning side to side and with assist to stand.   Cognition: Cognition Overall Cognitive Status: History of cognitive impairments - at baseline Arousal/Alertness: Awake/alert Orientation Level: Oriented X4 Attention: Sustained Sustained Attention: Appears intact Memory: Impaired Memory Impairment: Storage deficit,  Retrieval deficit Awareness: Impaired Awareness Impairment: Anticipatory impairment Problem Solving: Appears intact Behaviors: Impulsive Safety/Judgment: Impaired Cognition Arousal/Alertness: Awake/alert Behavior During Therapy: Impulsive Overall Cognitive Status: History of cognitive impairments - at baseline Area of Impairment: Safety/judgement Safety/Judgement: Decreased awareness of safety General Comments: pt reports he is a fast mover at baseline  Physical Exam: Blood pressure (!) 157/91, pulse 79, temperature 97.9 F (36.6 C), resp. rate 17, height '5\' 8"'$  (1.727 m), weight 92.6 kg (204 lb 3.2 oz), SpO2 98 %. Physical Exam  Vitals reviewed. HENT:  Mild facial droop  Eyes: EOM are normal.  Neck: Normal range of motion. Neck supple. No thyromegaly present.  Cardiovascular: Normal rate and regular rhythm.   Respiratory: Effort normal and breath sounds normal. No respiratory distress.  GI: Soft. Bowel sounds are normal. He exhibits no distension.  Skin. Warm and dry Musculoskeletal: small effusion around left knee. Some tenderness with PROM/AROM Left shoulder pain/weakness due to old  RTC injury Neurological: He is alertand oriented to person, place, and time. Coordinationabnormal.  Alert. Left central 7 present. Speech slightly dysarthric. LUE: 2/5 deltoid, 3+/5 biceps, triceps, and wrist/hand. LLE: 3+ to 4/5 HF, KE and ADF/PF. No sensory deficits Psych: pleasant   Lab Results Last 48 Hours        Results for orders placed or performed during the hospital encounter of 12/04/16 (from the past 48 hour(s))  CBC     Status: None   Collection Time: 12/06/16  5:02 AM  Result Value Ref Range   WBC 9.9 4.0 - 10.5 K/uL   RBC 4.94 4.22 - 5.81 MIL/uL   Hemoglobin 15.5 13.0 - 17.0 g/dL   HCT 45.6 39.0 - 52.0 %  MCV 92.3 78.0 - 100.0 fL   MCH 31.4 26.0 - 34.0 pg   MCHC 34.0 30.0 - 36.0 g/dL   RDW 13.0 11.5 - 15.5 %   Platelets 210 150 - 400 K/uL  Basic metabolic  panel     Status: Abnormal   Collection Time: 12/06/16  5:02 AM  Result Value Ref Range   Sodium 138 135 - 145 mmol/L   Potassium 3.7 3.5 - 5.1 mmol/L   Chloride 102 101 - 111 mmol/L   CO2 24 22 - 32 mmol/L   Glucose, Bld 112 (H) 65 - 99 mg/dL   BUN 12 6 - 20 mg/dL   Creatinine, Ser 0.95 0.61 - 1.24 mg/dL   Calcium 9.1 8.9 - 10.3 mg/dL   GFR calc non Af Amer >60 >60 mL/min   GFR calc Af Amer >60 >60 mL/min    Comment: (NOTE) The eGFR has been calculated using the CKD EPI equation. This calculation has not been validated in all clinical situations. eGFR's persistently <60 mL/min signify possible Chronic Kidney Disease.    Anion gap 12 5 - 15      Imaging Results (Last 48 hours)  Dg Chest 2 View  Result Date: 12/05/2016 CLINICAL DATA:  No chest complaints. EXAM: CHEST  2 VIEW COMPARISON:  Nov 16, 2012 FINDINGS: Pleural thickening versus tiny pleural effusions, unchanged. The cardiomediastinal silhouette is stable. The lungs remain clear. IMPRESSION: No interval change. Pleural thickening versus tiny pleural effusions. Electronically Signed   By: Dorise Bullion III M.D   On: 12/05/2016 14:42   Mr Brain Wo Contrast  Result Date: 12/05/2016 CLINICAL DATA:  LEFT-sided numbness and weakness began at 10 a.m. 12/04/2016. Stroke risk factors include hypertension, hyperlipidemia, coronary artery disease, prior TIA, and medication noncompliance. EXAM: MRI HEAD WITHOUT CONTRAST MRA HEAD WITHOUT CONTRAST TECHNIQUE: Multiplanar, multiecho pulse sequences of the brain and surrounding structures were obtained without intravenous contrast. Angiographic images of the head were obtained using MRA technique without contrast. COMPARISON:  Code stroke CT head 12/04/2016. FINDINGS: MRI HEAD FINDINGS Brain: Restricted diffusion, RIGHT lentiform nucleus extending to the RIGHT centrum semiovale and periventricular white matter, consistent with acute nonhemorrhagic infarction. RIGHT MCA  lenticulostriate territory involvement. There is a chronic lacunar infarct involving the RIGHT putamen, identified on CT, without restriction or hemorrhage. Normal for age cerebral volume. Mild subcortical and periventricular T2 and FLAIR hyperintensities, likely chronic microvascular ischemic change. No mass lesion, hydrocephalus, or extra-axial fluid. Vascular: Reported separately. Skull and upper cervical spine: Normal marrow signal. Sinuses/Orbits: Negative. Other: None. Compared with the code stroke CT, the acute infarct is not visible. MRA HEAD FINDINGS The internal carotid arteries are widely patent. No stenosis or dissection. The basilar artery is widely patent. Vertebrals are codominant and widely patent. There is no stenosis of the anterior, middle, or posterior cerebral arteries. Slight irregularity distal MCA and PCA branches suggest intracranial atherosclerotic change. No cerebellar branch occlusion.  No saccular aneurysm. IMPRESSION: Acute RIGHT MCA lenticulostriate territory infarct, nonhemorrhagic affecting the lentiform nucleus and adjacent white matter. Chronic RIGHT basal ganglia lacune. Mild chronic microvascular ischemic change. Negative MRA intracranial circulation. Electronically Signed   By: Staci Righter M.D.   On: 12/05/2016 14:23   Mr Jodene Nam Head/brain ER Cm  Result Date: 12/05/2016 CLINICAL DATA:  LEFT-sided numbness and weakness began at 10 a.m. 12/04/2016. Stroke risk factors include hypertension, hyperlipidemia, coronary artery disease, prior TIA, and medication noncompliance. EXAM: MRI HEAD WITHOUT CONTRAST MRA HEAD WITHOUT CONTRAST TECHNIQUE: Multiplanar, multiecho pulse sequences  of the brain and surrounding structures were obtained without intravenous contrast. Angiographic images of the head were obtained using MRA technique without contrast. COMPARISON:  Code stroke CT head 12/04/2016. FINDINGS: MRI HEAD FINDINGS Brain: Restricted diffusion, RIGHT lentiform nucleus extending  to the RIGHT centrum semiovale and periventricular white matter, consistent with acute nonhemorrhagic infarction. RIGHT MCA lenticulostriate territory involvement. There is a chronic lacunar infarct involving the RIGHT putamen, identified on CT, without restriction or hemorrhage. Normal for age cerebral volume. Mild subcortical and periventricular T2 and FLAIR hyperintensities, likely chronic microvascular ischemic change. No mass lesion, hydrocephalus, or extra-axial fluid. Vascular: Reported separately. Skull and upper cervical spine: Normal marrow signal. Sinuses/Orbits: Negative. Other: None. Compared with the code stroke CT, the acute infarct is not visible. MRA HEAD FINDINGS The internal carotid arteries are widely patent. No stenosis or dissection. The basilar artery is widely patent. Vertebrals are codominant and widely patent. There is no stenosis of the anterior, middle, or posterior cerebral arteries. Slight irregularity distal MCA and PCA branches suggest intracranial atherosclerotic change. No cerebellar branch occlusion.  No saccular aneurysm. IMPRESSION: Acute RIGHT MCA lenticulostriate territory infarct, nonhemorrhagic affecting the lentiform nucleus and adjacent white matter. Chronic RIGHT basal ganglia lacune. Mild chronic microvascular ischemic change. Negative MRA intracranial circulation. Electronically Signed   By: Staci Righter M.D.   On: 12/05/2016 14:23        Medical Problem List and Plan: 1.  Left hemiparesis with facial droop and slurred speech secondary to right MCA/lenticulostriate region infarct             -admit to inpatient rehab 2.  DVT Prophylaxis/Anticoagulation: Subcutaneous Lovenox. Monitor platelet counts and any signs of bleeding 3. Pain Management: Tramadol as needed 4. Mood: Provide emotional support 5. Neuropsych: This patient is capable of making decisions on his own behalf. 6. Skin/Wound Care: Routine skin checks 7. Fluids/Electrolytes/Nutrition:  Routine I&O with follow-up chemistries upon admit 8. Hypertension. Norvasc 5 mg daily. 9. History of CAD with CABG. No chest pain or shortness of breath. Continue Plavix 10. Tobacco abuse. Counseling 11. Hyperlipidemia. Lipitor 12. PUD. Pepcid daily    Post Admission Physician Evaluation: 1. Functional deficits secondary  to right MCA infarct. 2. Patient is admitted to receive collaborative, interdisciplinary care between the physiatrist, rehab nursing staff, and therapy team. 3. Patient's level of medical complexity and substantial therapy needs in context of that medical necessity cannot be provided at a lesser intensity of care such as a SNF. 4. Patient has experienced substantial functional loss from his/her baseline which was documented above under the "Functional History" and "Functional Status" headings.  Judging by the patient's diagnosis, physical exam, and functional history, the patient has potential for functional progress which will result in measurable gains while on inpatient rehab.  These gains will be of substantial and practical use upon discharge  in facilitating mobility and self-care at the household level. 5. Physiatrist will provide 24 hour management of medical needs as well as oversight of the therapy plan/treatment and provide guidance as appropriate regarding the interaction of the two. 6. The Preadmission Screening has been reviewed and patient status is unchanged unless otherwise stated above. 7. 24 hour rehab nursing will assist with bladder management, bowel management, safety, skin/wound care, disease management, medication administration, pain management and patient education  and help integrate therapy concepts, techniques,education, etc. 8. PT will assess and treat for/with: Lower extremity strength, range of motion, stamina, balance, functional mobility, safety, adaptive techniques and equipment, NMR, stroke education, community reintegration.  Goals are: mod  I. 9. OT will assess and treat for/with: ADL's, functional mobility, safety, upper extremity strength, adaptive techniques and equipment, NMR, pain mgt, family education, ego support.   Goals are: mod I . Therapy may proceed with showering this patient. 10. SLP will assess and treat for/with: speech, swallowing.  Goals are: mod I. 11. Case Management and Social Worker will assess and treat for psychological issues and discharge planning. 12. Team conference will be held weekly to assess progress toward goals and to determine barriers to discharge. 13. Patient will receive at least 3 hours of therapy per day at least 5 days per week. 14. ELOS: 7-9 days       15. Prognosis:  excellent     Meredith Staggers, MD, [facility] 12/07/2016  Cathlyn Parsons., PA-C 12/07/2016

## 2016-12-07 NOTE — Progress Notes (Signed)
Physical Therapy Treatment Patient Details Name: Brandon Martin MRN: [id number] DOB: 11/07/1965 Today's Date: 12/07/2016    History of Present Illness Pt is a 51 y.o. male with medical history significant for HTN, CAD s/p CABGx3. gastric ulcers, and HLD. He presented to the ED with L sided weakness, L facial droop, and slurring of speech. CT reveals age indeterminate R basal ganglia lacunar infarct.     PT Comments    Progressing steadily.  Still impulsive and needs to be held back to more normalize his movements.  Emphasis on sit to stand technique, standing balance and normalizing gait pattern while getting w/bearing in the left UE.   Follow Up Recommendations  CIR     Equipment Recommendations  Other (comment) (TBA)    Recommendations for Other Services Rehab consult     Precautions / Restrictions Precautions Precautions: Fall    Mobility  Bed Mobility Overal bed mobility: Needs Assistance Bed Mobility: Rolling;Sidelying to Sit Rolling: Min assist Sidelying to sit: Min assist       General bed mobility comments: If allowed, pt grabs rail and hauls himself up with R UE, but need min to bring trunk from Left to right and up onto right elbow and legs off the bed.  Transfers Overall transfer level: Needs assistance Equipment used: Ambulation equipment used Transfers: Sit to/from Stand Sit to Stand: Min assist         General transfer comment: assist to come forward.  Ambulation/Gait Ambulation/Gait assistance: Mod assist Ambulation Distance (Feet): 70 Feet (x2) Assistive device: 1 person hand held assist (rolling table) Gait Pattern/deviations: Step-through pattern;Decreased step length - right;Decreased stance time - left Gait velocity: decreased Gait velocity interpretation: Below normal speed for age/gender General Gait Details: working to improve w/shift to Right and heel/toe pattern on the left with assisted weight bearing and pushing rolling tray table with  left UE.   Stairs            Wheelchair Mobility    Modified Rankin (Stroke Patients Only) Modified Rankin (Stroke Patients Only) Pre-Morbid Rankin Score: No symptoms Modified Rankin: Moderately severe disability     Balance Overall balance assessment: Needs assistance Sitting-balance support: No upper extremity supported Sitting balance-Leahy Scale: Fair     Standing balance support: Single extremity supported;Bilateral upper extremity supported;During functional activity Standing balance-Leahy Scale: Poor Standing balance comment: Reliant on UE support and external assistance.                             Cognition Arousal/Alertness: Awake/alert Behavior During Therapy: Impulsive Overall Cognitive Status: History of cognitive impairments - at baseline Area of Impairment: Safety/judgement                         Safety/Judgement: Decreased awareness of safety            Exercises      General Comments        Pertinent Vitals/Pain Pain Assessment: No/denies pain    Home Living                      Prior Function            PT Goals (current goals can now be found in the care plan section) Acute Rehab PT Goals Patient Stated Goal: Go to rehab if insurance will pay for it  PT Goal Formulation: With patient Time For Goal Achievement: 12/19/16  Potential to Achieve Goals: Good Progress towards PT goals: Progressing toward goals    Frequency    Min 4X/week      PT Plan Current plan remains appropriate    Co-evaluation              AM-PAC PT "6 Clicks" Daily Activity  Outcome Measure  Difficulty turning over in bed (including adjusting bedclothes, sheets and blankets)?: Total Difficulty moving from lying on back to sitting on the side of the bed? : Total Difficulty sitting down on and standing up from a chair with arms (e.g., wheelchair, bedside commode, etc,.)?: A Lot Help needed moving to and from a bed  to chair (including a wheelchair)?: A Lot Help needed walking in hospital room?: A Lot Help needed climbing 3-5 steps with a railing? : A Lot 6 Click Score: 10    End of Session   Activity Tolerance: Patient tolerated treatment well Patient left: in chair;with chair alarm set Nurse Communication: Mobility status PT Visit Diagnosis: Unsteadiness on feet (R26.81);Hemiplegia and hemiparesis Hemiplegia - Right/Left: Left Hemiplegia - dominant/non-dominant: Non-dominant Hemiplegia - caused by: Cerebral infarction     Time: 6606-3016 PT Time Calculation (min) (ACUTE ONLY): 17 min  Charges:  $Gait Training: 8-22 mins                    G Codes:       12/16/16   Bing, PT [phone]  (pager)   Eliseo Gum Kenric Ginger 2016-12-16, 4:50 PM

## 2016-12-07 NOTE — Progress Notes (Signed)
[facility] Admission Coordinator Signed Physical Medicine and Rehabilitation  PMR Pre-admission Date of Service: 12/07/2016 10:51 AM  Related encounter: ED to Hosp-Admission (Current) from 12/04/2016 in [facility] MEDICAL       [] Hide copied text PMR Admission Coordinator Pre-Admission Assessment  Patient: Brandon Martin is an 51 y.o., male MRN: [id number] DOB: 1966/05/09 Height: 5\' 8"  (172.7 cm) Weight: 92.6 kg (204 lb 3.2 oz)                                                                                                                                                  Insurance Information HMO:     PPO:      PCP:      IPA:      80/20:      OTHER:  PRIMARY:  [employer] Medicare       Policy#: [id number]     Subscriber:  self CM Name:  Brandon Martin provided auth with follow up to be done by Brandon Martin Alert     Phone#:  [phone]     Fax#:  [phone] Pre-Cert#: [id number] with clinical updates due 12/13/16     Employer:  Disabled  Benefits:  Phone #:  online     Name:  Eff. Date:  07/12/16     Deduct:  $0      Out of Pocket Max:  $4400      Life Max:  n/a CIR:  $345 days 1-5       SNF:  100% first 20 days  Outpatient:  $40 per visit     Co-Pay Home Health: 100%     Co-Pay:   DME:  80%     Co-Pay:  20% Providers:  In network SECONDARY: n/a      Policy#:       Subscriber:  CM Name:       Phone#:      Fax#:  Pre-Cert#:       Employer:  Benefits:  Phone #:      Name:  Eff. Date:      Deduct:       Out of Pocket Max:       Life Max:  CIR:       SNF:  Outpatient:      Co-Pay:  Home Health:       Co-Pay:  DME:      Co-Pay:   Medicaid Application Date:       Case Manager:  Disability Application Date:       Case Worker:   Emergency Contact Information        Contact Information    Name Relation Home Work Mobile   [place] Mother [phone]     Brandon Martin [phone]       Current Medical History  Patient Admitting Diagnosis:  right  basal ganglia infarct with left hemiparesis History of Present Illness: Brandon Martin a 50 y.o.malewith history of CAD s/p CABG, [name], gastric ulcers, tobacco use.Per chart review patient lives with brother independent prior to admission. One level home. Assistance can be provided as needed. Admitted on 12/04/16 with LUE weakness progressing to LLE weakness, left facial droop and slurred speech as day progressed. CT MRI showed acute right MCA lenticulostriate territory infarct, nonhemorrhagic affecting the lentiform nucleusand adjacent white matter. Chronic right basal ganglia lacunar infarct. MRA negative. Patient did not receive TPA. Echocardiogram pending. Carotid Doppler no ICA stenosis. Neurology consulted placed on Plavix for CVA prophylaxis. Tolerating a mechanical soft diet. Physical occupational therapy evaluations completed with recommendations of physical medicine rehabilitation consult. Total: 7 [facility]  Past Medical History      Past Medical History:  Diagnosis Date  . Coronary artery disease   . GERD (gastroesophageal reflux disease)   . Hyperlipidemia   . Hypertension   . Pleural effusion   . Tobacco use disorder     Family History  family history includes Arthritis in his mother; Cancer in his father; Hypertension in his mother; Transient ischemic attack in his sister.  Prior Rehab/Hospitalizations:  Has the patient had major surgery during 100 days prior to admission? No  Current Medications   Current Facility-Administered Medications:  .  acetaminophen (TYLENOL) tablet 650 mg, 650 mg, Oral, Q4H PRN **OR** acetaminophen (TYLENOL) solution 650 mg, 650 mg, Per Tube, Q4H PRN **OR** acetaminophen (TYLENOL) suppository 650 mg, 650 mg, Rectal, Q4H PRN, Debe Coder B, MD .  amLODipine (NORVASC) tablet 5 mg, 5 mg, Oral, Daily, Brandon Ogle, MD .  atorvastatin (LIPITOR) tablet 20 mg, 20 mg, Oral, q1800, Brandon Ogle, MD, 20 mg at 12/06/16  1638 .  clopidogrel (PLAVIX) tablet 75 mg, 75 mg, Oral, Daily, Micki Riley, MD, 75 mg at 12/07/16 0906 .  enoxaparin (LOVENOX) injection 40 mg, 40 mg, Subcutaneous, Q24H, Debe Coder B, MD, 40 mg at 12/07/16 0905 .  famotidine (PEPCID) tablet 20 mg, 20 mg, Oral, Daily, Brandon Ogle, MD, 20 mg at 12/07/16 0906 .  ketorolac (TORADOL) 30 MG/ML injection 30 mg, 30 mg, Intravenous, Q6H PRN, Brandon Ogle, MD .  perflutren lipid microspheres (DEFINITY) IV suspension, 1-10 mL, Intravenous, PRN, Brandon Catalina, MD, 2 mL at 12/07/16 1245 .  senna-docusate (Senokot-S) tablet 1 tablet, 1 tablet, Oral, QHS PRN, Brandon Catalina, MD .  senna-docusate (Senokot-S) tablet 1 tablet, 1 tablet, Oral, BID, Brandon Ogle, MD, 1 tablet at 12/07/16 [phone] .  traMADol (ULTRAM) tablet 50 mg, 50 mg, Oral, Q6H PRN, Brandon Ogle, MD, 50 mg at 12/07/16 0041 .  traZODone (DESYREL) tablet 50 mg, 50 mg, Oral, QHS PRN, Brandon Ogle, MD, 50 mg at 12/07/16 0041  Patients Current Diet: DIET SOFT Room service appropriate? Yes; Fluid consistency: Thin  Precautions / Restrictions Precautions Precautions: Fall Restrictions Weight Bearing Restrictions: No   Has the patient had 2 or more falls or a fall with injury in the past year?  Brandon Martin Seat as a result of his stroke, abrasion on left knee  Prior Activity Level Community (5-7x/wk): Pt. has been on disability by his report since 1999.  He does not drive but goes out of the home most days for errands and to pay bills.  Home Assistive Devices / Equipment Home Assistive Devices/Equipment: None Home Equipment: Shower seat  Prior Device Use: Indicate devices/aids used by the patient  prior to current illness, exacerbation or injury? None of the above  Prior Functional Level Prior Function Level of Independence: Independent  Self Care: Did the patient need help bathing, dressing, using the toilet or eating?  Independent  Indoor Mobility: Did the patient need  assistance with walking from room to room (with or without device)? Independent  Stairs: Did the patient need assistance with internal or external stairs (with or without device)? Independent  Functional Cognition: Did the patient need help planning regular tasks such as shopping or remembering to take medications? Independent  Current Functional Level Cognition  Arousal/Alertness: Awake/alert Overall Cognitive Status: History of cognitive impairments - at baseline Orientation Level: Oriented X4 Safety/Judgement: Decreased awareness of safety General Comments: pt reports he is a fast mover at baseline Attention: Sustained Sustained Attention: Appears intact Memory: Impaired Memory Impairment: Storage deficit, Retrieval deficit Awareness: Impaired Awareness Impairment: Anticipatory impairment Problem Solving: Appears intact Behaviors: Impulsive Safety/Judgment: Impaired    Extremity Assessment (includes Sensation/Coordination)  Upper Extremity Assessment: Defer to OT evaluation LUE Deficits / Details: Minimal AROM of shoulder and elbow. Able to complete lap slides with increased time and effort. Hand Brunnstrom level II approaching III and arm level II.  Lower Extremity Assessment: LLE deficits/detail LLE Deficits / Details: Strength grossly intact. Pain with knee extension, crepitus noted. LLE Coordination: decreased fine motor (Heel shin WNL)    ADLs  Overall ADL's : Needs assistance/impaired Eating/Feeding: Minimal assistance, Sitting Grooming: Minimal assistance, Sitting, Wash/dry hands, Wash/dry face Upper Body Bathing: Minimal assistance, Sitting Lower Body Bathing: Minimal assistance, Sit to/from stand, Sitting/lateral leans Upper Body Dressing : Minimal assistance, Sitting Lower Body Dressing: Minimal assistance, Sit to/from stand Toilet Transfer: Minimal assistance, Ambulation Toilet Transfer Details (indicate cue type and reason): Pt reaching for furniture  unsafely. Toileting- Clothing Manipulation and Hygiene: Minimal assistance, Sit to/from stand Functional mobility during ADLs: Minimal assistance General ADL Comments: pt performed sponge bathing and dressing seated in chair, leaning side to side and with assist to stand.     Mobility  Overal bed mobility: Needs Assistance Bed Mobility: Supine to Sit Supine to sit: Min assist General bed mobility comments: pt in chair    Transfers  Overall transfer level: Needs assistance Equipment used: 1 person hand held assist Transfers: Sit to/from Stand Sit to Stand: Min assist General transfer comment: required 2 attempts, assist for balance    Ambulation / Gait / Stairs / Wheelchair Mobility  Ambulation/Gait Ambulation/Gait assistance: Mod assist Ambulation Distance (Feet): 20 Feet Assistive device: 1 person hand held assist, Rolling walker (2 wheeled) Gait Pattern/deviations: Step-through pattern, Decreased stride length, Decreased weight shift to right, Ataxic, Narrow base of support, Staggering left General Gait Details: 20' X 2 once with HHA, and once with RW. With HHA, pt with unstable gait, requiring up to mod A to maintain balance. Pt with narrow BOS and LOB noted to L X 3, requiring mod A to prevent fall. Attempted gait with RW and manual assist to maintain LUE on RW; increased stability, however, pt continuing to exhibit decreased balance and LOB X 1 to L. Verbal cues for upright posture. Gait velocity: decreased Gait velocity interpretation: <1.8 ft/sec, indicative of risk for recurrent falls    Posture / Balance Dynamic Sitting Balance Sitting balance - Comments: LOB back and to the L with pericare in sitting Static Standing Balance Tandem Stance - Right Leg: 0 Tandem Stance - Left Leg: 0 Rhomberg - Eyes Opened: 0 Balance Overall balance assessment: Needs assistance Sitting-balance support: No upper  extremity supported, Feet supported Sitting balance-Leahy Scale:  Fair Sitting balance - Comments: LOB back and to the L with pericare in sitting Postural control: Left lateral lean Standing balance support: Single extremity supported, Bilateral upper extremity supported, During functional activity Standing balance-Leahy Scale: Poor Standing balance comment: Reliant on UE support and external assistance.  Tandem Stance - Right Leg: 0 Tandem Stance - Left Leg: 0 Rhomberg - Eyes Opened: 0 High Level Balance Comments: Unable to perform romberg or tandem without physical assistance.    Special needs/care consideration BiPAP/CPAP   no CPM   no Continuous Drip IV   no Dialysis   no        Life Vest   no Oxygen   no Special Bed   no Trach Size   n/a Wound Vac (area)   no     Skin    L knee abrasion with foam dressing intact; due to fall when pt. had stroke                             Bowel mgmt:  Last BM 12/03/16 Bladder mgmt:   Using urinal at bedside, continent Diabetic mgmt no     Previous Home Environment Living Arrangements: Parent, Other relatives (brother) Available Help at Discharge: Family, Available 24 hours/day Type of Home: House Home Layout: One level Home Access: Stairs to enter Entrance Stairs-Rails: None Entrance Stairs-Number of Steps: 2 steps down Bathroom Shower/Tub: Associate Professor: Yes Home Care Services: No  Discharge Living Setting Plans for Discharge Living Setting: Patient's home Type of Home at Discharge: House Discharge Home Layout: One level Discharge Home Access: Stairs to enter Entrance Stairs-Rails: None Entrance Stairs-Number of Steps: 2 (2 steps down) Discharge Bathroom Shower/Tub: Tub/shower unit Discharge Bathroom Toilet: Standard Discharge Bathroom Accessibility: Yes How Accessible: Accessible via walker Does the patient have any problems obtaining your medications?: Yes (Describe) (pt. says he stopped taking all meds due to  cost)  Social/Family/Support Systems Patient Roles: Other (Comment) (lives with mom and 3 brothers, one sister in Social worker) Anticipated Caregiver: Primary contact is Jeananne Rama, brother Anticipated Caregiver's Contact Information: Jeananne Rama, brother, [phone] Ability/Limitations of Caregiver: Multiple siblings live in the home; 3 brothers work 3rd shift, sister in law works daytime.  Pt. will have someone availabel 24/7 Caregiver Availability: 24/7 Discharge Plan Discussed with Primary Caregiver: Yes Is Caregiver In Agreement with Plan?: Yes Does Caregiver/Family have Issues with Lodging/Transportation while Pt is in Rehab?: No   Goals/Additional Needs Patient/Family Goal for Rehab: modified independent PT/OT/SLP Expected length of stay: 7 days Cultural Considerations: n/a Dietary Needs: soft diet, thin liquids Equipment Needs: TBA Pt/Family Agrees to Admission and willing to participate: Yes Program Orientation Provided & Reviewed with Pt/Caregiver Including Roles  & Responsibilities: Yes   Decrease burden of Care through IP rehab admission: n/a   Possible need for SNF placement upon discharge:    Not anticipated   Patient Condition: This patient's condition remains as documented in the consult dated 12/06/16 , in which the Rehabilitation Physician determined and documented that the patient's condition is appropriate for intensive rehabilitative care in an inpatient rehabilitation facility. Will admit to inpatient rehab today.  Preadmission Screen Completed By:  Weldon Picking, 12/07/2016 2:11 PM ______________________________________________________________________   Discussed status with Dr.  Riley Kill on 12/07/16 at  1411  and received telephone approval for admission today.  Admission Coordinator:  Weldon Picking, time 0981 Dorna Bloom 12/07/16  Cosigned by: Ranelle Oyster, MD at 12/07/2016 2:50 PM  Revision History

## 2016-12-07 NOTE — H&P (Signed)
Physical Medicine and Rehabilitation Admission H&P    Chief Complaint  Patient presents with  . Numbness  : HPI: Brandon Martin is a 51 y.o. male with history of CAD s/p CABG, [name], gastric ulcers, tobacco use.Per chart review patient lives with brother independent prior to admission. One level home. Assistance can be provided as needed. Admitted on 12/04/16 with LUE weakness progressing to LLE weakness, left facial droop and slurred speech as day progressed. CT MRI showed acute right MCA lenticulostriate territory infarct, nonhemorrhagic affecting the lentiform nucleus  and adjacent white matter. Chronic right basal ganglia lacunar infarct. MRA negative. Patient did not receive TPA. Echocardiogram with ejection fraction of 14% grade 1 diastolic dysfunction. Carotid Doppler no ICA stenosis. Neurology consulted placed on Plavix for CVA prophylaxis. Tolerating a mechanical soft diet. Physical occupational therapy evaluations completed with recommendations of physical medicine rehabilitation consult.  Review of Systems  Constitutional: Negative for chills and fever.  HENT: Negative for hearing loss.   Eyes: Negative for blurred vision and double vision.  Respiratory: Positive for shortness of breath. Negative for cough.   Cardiovascular: Negative for chest pain and palpitations.  Gastrointestinal: Positive for constipation. Negative for nausea.       GERD  Genitourinary: Positive for urgency. Negative for dysuria, flank pain and hematuria.  Musculoskeletal: Positive for myalgias.  Skin: Negative for rash.  Neurological: Positive for speech change and focal weakness. Negative for seizures.  All other systems reviewed and are negative.  Past Medical History:  Diagnosis Date  . Coronary artery disease   . GERD (gastroesophageal reflux disease)   . Hyperlipidemia   . Hypertension   . Pleural effusion   . Tobacco use disorder    Past Surgical History:  Procedure  Laterality Date  . CORONARY ARTERY BYPASS GRAFT  x 3   Family History  Problem Relation Age of Onset  . Hypertension Mother   . Arthritis Mother   . Cancer Father   . Transient ischemic attack Sister    Social History:  reports that he has been smoking Cigarettes.  He has quit using smokeless tobacco. He reports that he does not drink alcohol or use drugs. Allergies:  Allergies  Allergen Reactions  . Aspirin Other (See Comments)    HX of gastric ulcers  . Morphine And Related Nausea And Vomiting  . Latex Rash   Medications Prior to Admission  Medication Sig Dispense Refill  . metoprolol tartrate (LOPRESSOR) 25 MG tablet Take 1 tablet (25 mg total) by mouth 2 (two) times daily. 60 tablet 1  . nitroGLYCERIN (NITROSTAT) 0.4 MG SL tablet Place 1 tablet (0.4 mg total) under the tongue every 5 (five) minutes as needed for chest pain. (Patient not taking: Reported on 12/04/2016) 25 tablet 11    Home: Home Living Family/patient expects to be discharged to:: Private residence Living Arrangements: Parent, Other relatives (brother) Available Help at Discharge: Family, Available 24 hours/day Type of Home: House Home Access: Stairs to enter [employer] of Steps: 2 steps down Entrance Stairs-Rails: None Home Layout: One level Bathroom Shower/Tub: Chiropodist: Standard Bathroom Accessibility: Yes Home Equipment: Industrial/product designer History: Prior Function Level of Independence: Independent  Functional Status:  Mobility: Bed Mobility Overal bed mobility: Needs Assistance Bed Mobility: Supine to Sit Supine to sit: Min assist General bed mobility comments: pt in chair Transfers Overall transfer level: Needs assistance Equipment used: 1 person hand held assist Transfers: Sit to/from Stand  Sit to Stand: Min assist General transfer comment: required 2 attempts, assist for balance Ambulation/Gait Ambulation/Gait assistance: Mod assist Ambulation  Distance (Feet): 20 Feet Assistive device: 1 person hand held assist, Rolling walker (2 wheeled) Gait Pattern/deviations: Step-through pattern, Decreased stride length, Decreased weight shift to right, Ataxic, Narrow base of support, Staggering left General Gait Details: 20' X 2 once with HHA, and once with RW. With HHA, pt with unstable gait, requiring up to mod A to maintain balance. Pt with narrow BOS and LOB noted to L X 3, requiring mod A to prevent fall. Attempted gait with RW and manual assist to maintain LUE on RW; increased stability, however, pt continuing to exhibit decreased balance and LOB X 1 to L. Verbal cues for upright posture. Gait velocity: decreased Gait velocity interpretation: <1.8 ft/sec, indicative of risk for recurrent falls    ADL: ADL Overall ADL's : Needs assistance/impaired Eating/Feeding: Minimal assistance, Sitting Grooming: Minimal assistance, Sitting, Wash/dry hands, Wash/dry face Upper Body Bathing: Minimal assistance, Sitting Lower Body Bathing: Minimal assistance, Sit to/from stand, Sitting/lateral leans Upper Body Dressing : Minimal assistance, Sitting Lower Body Dressing: Minimal assistance, Sit to/from stand Toilet Transfer: Minimal assistance, Ambulation Toilet Transfer Details (indicate cue type and reason): Pt reaching for furniture unsafely. Toileting- Clothing Manipulation and Hygiene: Minimal assistance, Sit to/from stand Functional mobility during ADLs: Minimal assistance General ADL Comments: pt performed sponge bathing and dressing seated in chair, leaning side to side and with assist to stand.   Cognition: Cognition Overall Cognitive Status: History of cognitive impairments - at baseline Arousal/Alertness: Awake/alert Orientation Level: Oriented X4 Attention: Sustained Sustained Attention: Appears intact Memory: Impaired Memory Impairment: Storage deficit, Retrieval deficit Awareness: Impaired Awareness Impairment: Anticipatory  impairment Problem Solving: Appears intact Behaviors: Impulsive Safety/Judgment: Impaired Cognition Arousal/Alertness: Awake/alert Behavior During Therapy: Impulsive Overall Cognitive Status: History of cognitive impairments - at baseline Area of Impairment: Safety/judgement Safety/Judgement: Decreased awareness of safety General Comments: pt reports he is a fast mover at baseline  Physical Exam: Blood pressure (!) 157/91, pulse 79, temperature 97.9 F (36.6 C), resp. rate 17, height _0  (1.727 m), weight 92.6 kg (204 lb 3.2 oz), SpO2 98 %. Physical Exam  Vitals reviewed. HENT:  Mild facial droop  Eyes: EOM are normal.  Neck: Normal range of motion. Neck supple. No thyromegaly present.  Cardiovascular: Normal rate and regular rhythm.   Respiratory: Effort normal and breath sounds normal. No respiratory distress.  GI: Soft. Bowel sounds are normal. He exhibits no distension.  Skin. Warm and dry Musculoskeletal: small effusion around left knee. Some tenderness with PROM/AROM Left shoulder pain/weakness due to old  RTC injury  Neurological: He is alert and oriented to person, place, and time. Coordination abnormal.  Alert. Left central 7 present. Speech slightly dysarthric. LUE: 2/5 deltoid, 3+/5 biceps, triceps, and wrist/hand. LLE: 3+ to 4/5 HF, KE and ADF/PF. No sensory deficits Psych: pleasant   Results for orders placed or performed during the hospital encounter of 12/04/16 (from the past 48 hour(s))  CBC     Status: None   Collection Time: 12/06/16  5:02 AM  Result Value Ref Range   WBC 9.9 4.0 - 10.5 K/uL   RBC 4.94 4.22 - 5.81 MIL/uL   Hemoglobin 15.5 13.0 - 17.0 g/dL   HCT 45.6 39.0 - 52.0 %   MCV 92.3 78.0 - 100.0 fL   MCH 31.4 26.0 - 34.0 pg   MCHC 34.0 30.0 - 36.0 g/dL   RDW 13.0 11.5 - 15.5 %  Platelets 210 150 - 400 K/uL  Basic metabolic panel     Status: Abnormal   Collection Time: 12/06/16  5:02 AM  Result Value Ref Range   Sodium 138 135 - 145 mmol/L     Potassium 3.7 3.5 - 5.1 mmol/L   Chloride 102 101 - 111 mmol/L   CO2 24 22 - 32 mmol/L   Glucose, Bld 112 (H) 65 - 99 mg/dL   BUN 12 6 - 20 mg/dL   Creatinine, Ser 0.95 0.61 - 1.24 mg/dL   Calcium 9.1 8.9 - 10.3 mg/dL   GFR calc non Af Amer >60 >60 mL/min   GFR calc Af Amer >60 >60 mL/min    Comment: (NOTE) The eGFR has been calculated using the CKD EPI equation. This calculation has not been validated in all clinical situations. eGFR's persistently <60 mL/min signify possible Chronic Kidney Disease.    Anion gap 12 5 - 15   Dg Chest 2 View  Result Date: 12/05/2016 CLINICAL DATA:  No chest complaints. EXAM: CHEST  2 VIEW COMPARISON:  Nov 16, 2012 FINDINGS: Pleural thickening versus tiny pleural effusions, unchanged. The cardiomediastinal silhouette is stable. The lungs remain clear. IMPRESSION: No interval change. Pleural thickening versus tiny pleural effusions. Electronically Signed   By: Dorise Bullion III M.D   On: 12/05/2016 14:42   Mr Brain Wo Contrast  Result Date: 12/05/2016 CLINICAL DATA:  LEFT-sided numbness and weakness began at 10 a.m. 12/04/2016. Stroke risk factors include hypertension, hyperlipidemia, coronary artery disease, prior TIA, and medication noncompliance. EXAM: MRI HEAD WITHOUT CONTRAST MRA HEAD WITHOUT CONTRAST TECHNIQUE: Multiplanar, multiecho pulse sequences of the brain and surrounding structures were obtained without intravenous contrast. Angiographic images of the head were obtained using MRA technique without contrast. COMPARISON:  Code stroke CT head 12/04/2016. FINDINGS: MRI HEAD FINDINGS Brain: Restricted diffusion, RIGHT lentiform nucleus extending to the RIGHT centrum semiovale and periventricular white matter, consistent with acute nonhemorrhagic infarction. RIGHT MCA lenticulostriate territory involvement. There is a chronic lacunar infarct involving the RIGHT putamen, identified on CT, without restriction or hemorrhage. Normal for age cerebral  volume. Mild subcortical and periventricular T2 and FLAIR hyperintensities, likely chronic microvascular ischemic change. No mass lesion, hydrocephalus, or extra-axial fluid. Vascular: Reported separately. Skull and upper cervical spine: Normal marrow signal. Sinuses/Orbits: Negative. Other: None. Compared with the code stroke CT, the acute infarct is not visible. MRA HEAD FINDINGS The internal carotid arteries are widely patent. No stenosis or dissection. The basilar artery is widely patent. Vertebrals are codominant and widely patent. There is no stenosis of the anterior, middle, or posterior cerebral arteries. Slight irregularity distal MCA and PCA branches suggest intracranial atherosclerotic change. No cerebellar branch occlusion.  No saccular aneurysm. IMPRESSION: Acute RIGHT MCA lenticulostriate territory infarct, nonhemorrhagic affecting the lentiform nucleus and adjacent white matter. Chronic RIGHT basal ganglia lacune. Mild chronic microvascular ischemic change. Negative MRA intracranial circulation. Electronically Signed   By: Staci Righter M.D.   On: 12/05/2016 14:23   Mr Jodene Nam Head/brain XN Cm  Result Date: 12/05/2016 CLINICAL DATA:  LEFT-sided numbness and weakness began at 10 a.m. 12/04/2016. Stroke risk factors include hypertension, hyperlipidemia, coronary artery disease, prior TIA, and medication noncompliance. EXAM: MRI HEAD WITHOUT CONTRAST MRA HEAD WITHOUT CONTRAST TECHNIQUE: Multiplanar, multiecho pulse sequences of the brain and surrounding structures were obtained without intravenous contrast. Angiographic images of the head were obtained using MRA technique without contrast. COMPARISON:  Code stroke CT head 12/04/2016. FINDINGS: MRI HEAD FINDINGS Brain: Restricted diffusion, RIGHT lentiform nucleus  extending to the RIGHT centrum semiovale and periventricular white matter, consistent with acute nonhemorrhagic infarction. RIGHT MCA lenticulostriate territory involvement. There is a chronic  lacunar infarct involving the RIGHT putamen, identified on CT, without restriction or hemorrhage. Normal for age cerebral volume. Mild subcortical and periventricular T2 and FLAIR hyperintensities, likely chronic microvascular ischemic change. No mass lesion, hydrocephalus, or extra-axial fluid. Vascular: Reported separately. Skull and upper cervical spine: Normal marrow signal. Sinuses/Orbits: Negative. Other: None. Compared with the code stroke CT, the acute infarct is not visible. MRA HEAD FINDINGS The internal carotid arteries are widely patent. No stenosis or dissection. The basilar artery is widely patent. Vertebrals are codominant and widely patent. There is no stenosis of the anterior, middle, or posterior cerebral arteries. Slight irregularity distal MCA and PCA branches suggest intracranial atherosclerotic change. No cerebellar branch occlusion.  No saccular aneurysm. IMPRESSION: Acute RIGHT MCA lenticulostriate territory infarct, nonhemorrhagic affecting the lentiform nucleus and adjacent white matter. Chronic RIGHT basal ganglia lacune. Mild chronic microvascular ischemic change. Negative MRA intracranial circulation. Electronically Signed   By: Staci Righter M.D.   On: 12/05/2016 14:23       Medical Problem List and Plan: 1.  Left hemiparesis with facial droop and slurred speech secondary to right MCA/lenticulostriate region infarct  -admit to inpatient rehab 2.  DVT Prophylaxis/Anticoagulation: Subcutaneous Lovenox. Monitor platelet counts and any signs of bleeding 3. Pain Management: Tramadol as needed 4. Mood: Provide emotional support 5. Neuropsych: This patient is capable of making decisions on his own behalf. 6. Skin/Wound Care: Routine skin checks 7. Fluids/Electrolytes/Nutrition: Routine I&O with follow-up chemistries upon admit 8. Hypertension. Norvasc 5 mg daily. 9. History of CAD with CABG. No chest pain or shortness of breath. Continue Plavix 10. Tobacco abuse.  Counseling 11. Hyperlipidemia. Lipitor 12. PUD. Pepcid daily    Post Admission Physician Evaluation: 1. Functional deficits secondary  to right MCA infarct. 2. Patient is admitted to receive collaborative, interdisciplinary care between the physiatrist, rehab nursing staff, and therapy team. 3. Patient's level of medical complexity and substantial therapy needs in context of that medical necessity cannot be provided at a lesser intensity of care such as a SNF. 4. Patient has experienced substantial functional loss from his/her baseline which was documented above under the "Functional History" and "Functional Status" headings.  Judging by the patient's diagnosis, physical exam, and functional history, the patient has potential for functional progress which will result in measurable gains while on inpatient rehab.  These gains will be of substantial and practical use upon discharge  in facilitating mobility and self-care at the household level. 5. Physiatrist will provide 24 hour management of medical needs as well as oversight of the therapy plan/treatment and provide guidance as appropriate regarding the interaction of the two. 6. The Preadmission Screening has been reviewed and patient status is unchanged unless otherwise stated above. 7. 24 hour rehab nursing will assist with bladder management, bowel management, safety, skin/wound care, disease management, medication administration, pain management and patient education  and help integrate therapy concepts, techniques,education, etc. 8. PT will assess and treat for/with: Lower extremity strength, range of motion, stamina, balance, functional mobility, safety, adaptive techniques and equipment, NMR, stroke education, community reintegration.   Goals are: mod I. 9. OT will assess and treat for/with: ADL's, functional mobility, safety, upper extremity strength, adaptive techniques and equipment, NMR, pain mgt, family education, ego support.   Goals  are: mod I . Therapy may proceed with showering this patient. 10. SLP will assess and treat  for/with: speech, swallowing.  Goals are: mod I. 11. Case Management and Social Worker will assess and treat for psychological issues and discharge planning. 12. Team conference will be held weekly to assess progress toward goals and to determine barriers to discharge. 13. Patient will receive at least 3 hours of therapy per day at least 5 days per week. 14. ELOS: 7-9 days       15. Prognosis:  excellent     Meredith Staggers, MD, [facility] 12/07/2016  Cathlyn Parsons., PA-C 12/07/2016

## 2016-12-07 NOTE — PMR Pre-admission (Signed)
PMR Admission Coordinator Pre-Admission Assessment  Patient: Brandon Martin is an 51 y.o., male MRN: [id number] DOB: May 05, 1966 Height: 5\' 8"  (172.7 cm) Weight: 92.6 kg (204 lb 3.2 oz)              Insurance Information HMO:     PPO:      PCP:      IPA:      80/20:      OTHER:  PRIMARY:  [employer] Medicare       Policy#: [id number]     Subscriber:  self CM Name:  Gweneth Dimitri provided auth with follow up to be done by Rebeca Alert     Phone#:  [phone]     Fax#:  [phone] Pre-Cert#: [id number] with clinical updates due 12/13/16     Employer:  Disabled  Benefits:  Phone #:  online     Name:  Eff. Date:  07/12/16     Deduct:  $0      Out of Pocket Max:  $4400      Life Max:  n/a CIR:  $345 days 1-5       SNF:  100% first 20 days  Outpatient:  $40 per visit     Co-Pay Home Health: 100%     Co-Pay:   DME:  80%     Co-Pay:  20% Providers:  In network SECONDARY: n/a      Policy#:       Subscriber:  CM Name:       Phone#:      Fax#:  Pre-Cert#:       Employer:  Benefits:  Phone #:      Name:  Eff. Date:      Deduct:       Out of Pocket Max:       Life Max:  CIR:       SNF:  Outpatient:      Co-Pay:  Home Health:       Co-Pay:  DME:      Co-Pay:   Medicaid Application Date:       Case Manager:  Disability Application Date:       Case Worker:   Emergency Contact Information Contact Information    Name Relation Home Work Mobile   [place] Mother [phone]     Nickolas [place] [phone]       Current Medical History  Patient Admitting Diagnosis:  right basal ganglia infarct with left hemiparesis History of Present Illness: Brandon Martin a 50 y.o.malewith history of CAD s/p CABG, [name], gastric ulcers, tobacco use.Per chart review patient lives with brother independent prior to admission. One level home. Assistance can be provided as needed. Admitted on 12/04/16 with LUE weakness progressing to LLE weakness, left facial droop and slurred speech as day progressed.  CT MRI showed acute right MCA lenticulostriate territory infarct, nonhemorrhagic affecting the lentiform nucleus  and adjacent white matter. Chronic right basal ganglia lacunar infarct. MRA negative. Patient did not receive TPA. Echocardiogram pending. Carotid Doppler no ICA stenosis. Neurology consulted placed on Plavix for CVA prophylaxis. Tolerating a mechanical soft diet. Physical occupational therapy evaluations completed with recommendations of physical medicine rehabilitation consult. Total: 7 NIH    Past Medical History  Past Medical History:  Diagnosis Date  . Coronary artery disease   . GERD (gastroesophageal reflux disease)   . Hyperlipidemia   . Hypertension   . Pleural effusion   . Tobacco use disorder  Family History  family history includes Arthritis in his mother; Cancer in his father; Hypertension in his mother; Transient ischemic attack in his sister.  Prior Rehab/Hospitalizations:  Has the patient had major surgery during 100 days prior to admission? No  Current Medications   Current Facility-Administered Medications:  .  acetaminophen (TYLENOL) tablet 650 mg, 650 mg, Oral, Q4H PRN **OR** acetaminophen (TYLENOL) solution 650 mg, 650 mg, Per Tube, Q4H PRN **OR** acetaminophen (TYLENOL) suppository 650 mg, 650 mg, Rectal, Q4H PRN, Debe Coder B, MD .  amLODipine (NORVASC) tablet 5 mg, 5 mg, Oral, Daily, Dorothea Ogle, MD .  atorvastatin (LIPITOR) tablet 20 mg, 20 mg, Oral, q1800, Dorothea Ogle, MD, 20 mg at 12/06/16 1638 .  clopidogrel (PLAVIX) tablet 75 mg, 75 mg, Oral, Daily, Micki Riley, MD, 75 mg at 12/07/16 0906 .  enoxaparin (LOVENOX) injection 40 mg, 40 mg, Subcutaneous, Q24H, Debe Coder B, MD, 40 mg at 12/07/16 0905 .  famotidine (PEPCID) tablet 20 mg, 20 mg, Oral, Daily, Dorothea Ogle, MD, 20 mg at 12/07/16 0906 .  ketorolac (TORADOL) 30 MG/ML injection 30 mg, 30 mg, Intravenous, Q6H PRN, Dorothea Ogle, MD .  perflutren lipid microspheres  (DEFINITY) IV suspension, 1-10 mL, Intravenous, PRN, Inez Catalina, MD, 2 mL at 12/07/16 1245 .  senna-docusate (Senokot-S) tablet 1 tablet, 1 tablet, Oral, QHS PRN, Inez Catalina, MD .  senna-docusate (Senokot-S) tablet 1 tablet, 1 tablet, Oral, BID, Dorothea Ogle, MD, 1 tablet at 12/07/16 [phone] .  traMADol (ULTRAM) tablet 50 mg, 50 mg, Oral, Q6H PRN, Dorothea Ogle, MD, 50 mg at 12/07/16 0041 .  traZODone (DESYREL) tablet 50 mg, 50 mg, Oral, QHS PRN, Dorothea Ogle, MD, 50 mg at 12/07/16 0041  Patients Current Diet: DIET SOFT Room service appropriate? Yes; Fluid consistency: Thin  Precautions / Restrictions Precautions Precautions: Fall Restrictions Weight Bearing Restrictions: No   Has the patient had 2 or more falls or a fall with injury in the past year?  Larey Seat as a result of his stroke, abrasion on left knee  Prior Activity Level Community (5-7x/wk): Pt. has been on disability by his report since 1999.  He does not drive but goes out of the home most days for errands and to pay bills.  Home Assistive Devices / Equipment Home Assistive Devices/Equipment: None Home Equipment: Shower seat  Prior Device Use: Indicate devices/aids used by the patient prior to current illness, exacerbation or injury? None of the above  Prior Functional Level Prior Function Level of Independence: Independent  Self Care: Did the patient need help bathing, dressing, using the toilet or eating?  Independent  Indoor Mobility: Did the patient need assistance with walking from room to room (with or without device)? Independent  Stairs: Did the patient need assistance with internal or external stairs (with or without device)? Independent  Functional Cognition: Did the patient need help planning regular tasks such as shopping or remembering to take medications? Independent  Current Functional Level Cognition  Arousal/Alertness: Awake/alert Overall Cognitive Status: History of cognitive impairments  - at baseline Orientation Level: Oriented X4 Safety/Judgement: Decreased awareness of safety General Comments: pt reports he is a fast mover at baseline Attention: Sustained Sustained Attention: Appears intact Memory: Impaired Memory Impairment: Storage deficit, Retrieval deficit Awareness: Impaired Awareness Impairment: Anticipatory impairment Problem Solving: Appears intact Behaviors: Impulsive Safety/Judgment: Impaired    Extremity Assessment (includes Sensation/Coordination)  Upper Extremity Assessment: Defer to OT evaluation LUE Deficits / Details: Minimal AROM of  shoulder and elbow. Able to complete lap slides with increased time and effort. Hand Brunnstrom level II approaching III and arm level II.  Lower Extremity Assessment: LLE deficits/detail LLE Deficits / Details: Strength grossly intact. Pain with knee extension, crepitus noted. LLE Coordination: decreased fine motor (Heel shin WNL)    ADLs  Overall ADL's : Needs assistance/impaired Eating/Feeding: Minimal assistance, Sitting Grooming: Minimal assistance, Sitting, Wash/dry hands, Wash/dry face Upper Body Bathing: Minimal assistance, Sitting Lower Body Bathing: Minimal assistance, Sit to/from stand, Sitting/lateral leans Upper Body Dressing : Minimal assistance, Sitting Lower Body Dressing: Minimal assistance, Sit to/from stand Toilet Transfer: Minimal assistance, Ambulation Toilet Transfer Details (indicate cue type and reason): Pt reaching for furniture unsafely. Toileting- Clothing Manipulation and Hygiene: Minimal assistance, Sit to/from stand Functional mobility during ADLs: Minimal assistance General ADL Comments: pt performed sponge bathing and dressing seated in chair, leaning side to side and with assist to stand.     Mobility  Overal bed mobility: Needs Assistance Bed Mobility: Supine to Sit Supine to sit: Min assist General bed mobility comments: pt in chair    Transfers  Overall transfer level:  Needs assistance Equipment used: 1 person hand held assist Transfers: Sit to/from Stand Sit to Stand: Min assist General transfer comment: required 2 attempts, assist for balance    Ambulation / Gait / Stairs / Wheelchair Mobility  Ambulation/Gait Ambulation/Gait assistance: Mod assist Ambulation Distance (Feet): 20 Feet Assistive device: 1 person hand held assist, Rolling walker (2 wheeled) Gait Pattern/deviations: Step-through pattern, Decreased stride length, Decreased weight shift to right, Ataxic, Narrow base of support, Staggering left General Gait Details: 20' X 2 once with HHA, and once with RW. With HHA, pt with unstable gait, requiring up to mod A to maintain balance. Pt with narrow BOS and LOB noted to L X 3, requiring mod A to prevent fall. Attempted gait with RW and manual assist to maintain LUE on RW; increased stability, however, pt continuing to exhibit decreased balance and LOB X 1 to L. Verbal cues for upright posture. Gait velocity: decreased Gait velocity interpretation: <1.8 ft/sec, indicative of risk for recurrent falls    Posture / Balance Dynamic Sitting Balance Sitting balance - Comments: LOB back and to the L with pericare in sitting Static Standing Balance Tandem Stance - Right Leg: 0 Tandem Stance - Left Leg: 0 Rhomberg - Eyes Opened: 0 Balance Overall balance assessment: Needs assistance Sitting-balance support: No upper extremity supported, Feet supported Sitting balance-Leahy Scale: Fair Sitting balance - Comments: LOB back and to the L with pericare in sitting Postural control: Left lateral lean Standing balance support: Single extremity supported, Bilateral upper extremity supported, During functional activity Standing balance-Leahy Scale: Poor Standing balance comment: Reliant on UE support and external assistance.  Tandem Stance - Right Leg: 0 Tandem Stance - Left Leg: 0 Rhomberg - Eyes Opened: 0 High Level Balance Comments: Unable to perform  romberg or tandem without physical assistance.    Special needs/care consideration BiPAP/CPAP   no CPM   no Continuous Drip IV   no Dialysis   no        Life Vest   no Oxygen   no Special Bed   no Trach Size   n/a Wound Vac (area)   no     Skin    L knee abrasion with foam dressing intact; due to fall when pt. had stroke  Bowel mgmt:  Last BM 12/03/16 Bladder mgmt:   Using urinal at bedside, continent Diabetic mgmt no     Previous Home Environment Living Arrangements: Parent, Other relatives (brother) Available Help at Discharge: Family, Available 24 hours/day Type of Home: House Home Layout: One level Home Access: Stairs to enter Entrance Stairs-Rails: None Entrance Stairs-Number of Steps: 2 steps down Bathroom Shower/Tub: Associate Professor: Yes Home Care Services: No  Discharge Living Setting Plans for Discharge Living Setting: Patient's home Type of Home at Discharge: House Discharge Home Layout: One level Discharge Home Access: Stairs to enter Entrance Stairs-Rails: None Entrance Stairs-Number of Steps: 2 (2 steps down) Discharge Bathroom Shower/Tub: Tub/shower unit Discharge Bathroom Toilet: Standard Discharge Bathroom Accessibility: Yes How Accessible: Accessible via walker Does the patient have any problems obtaining your medications?: Yes (Describe) (pt. says he stopped taking all meds due to cost)  Social/Family/Support Systems Patient Roles: Other (Comment) (lives with mom and 3 brothers, one sister in Social worker) Anticipated Caregiver: Primary contact is Jeananne Rama, brother Anticipated Caregiver's Contact Information: Jeananne Rama, brother, [phone] Ability/Limitations of Caregiver: Multiple siblings live in the home; 3 brothers work 3rd shift, sister in law works daytime.  Pt. will have someone availabel 24/7 Caregiver Availability: 24/7 Discharge Plan Discussed with Primary Caregiver:  Yes Is Caregiver In Agreement with Plan?: Yes Does Caregiver/Family have Issues with Lodging/Transportation while Pt is in Rehab?: No   Goals/Additional Needs Patient/Family Goal for Rehab: modified independent PT/OT/SLP Expected length of stay: 7 days Cultural Considerations: n/a Dietary Needs: soft diet, thin liquids Equipment Needs: TBA Pt/Family Agrees to Admission and willing to participate: Yes Program Orientation Provided & Reviewed with Pt/Caregiver Including Roles  & Responsibilities: Yes   Decrease burden of Care through IP rehab admission: n/a   Possible need for SNF placement upon discharge:    Not anticipated   Patient Condition: This patient's condition remains as documented in the consult dated 12/06/16 , in which the Rehabilitation Physician determined and documented that the patient's condition is appropriate for intensive rehabilitative care in an inpatient rehabilitation facility. Will admit to inpatient rehab today.  Preadmission Screen Completed By:  Weldon Picking, 12/07/2016 2:11 PM ______________________________________________________________________   Discussed status with Dr.  Riley Kill on 12/07/16 at  1411  and received telephone approval for admission today.  Admission Coordinator:  Weldon Picking, time [address] 12/07/16

## 2016-12-07 NOTE — Progress Notes (Signed)
Occupational Therapy Treatment Patient Details Name: Brandon Martin MRN: [id number] DOB: [birth year] Today's Date: 12/07/2016    History of present illness Pt is a 51 y.o. male with medical history significant for HTN, CAD s/p CABGx3. gastric ulcers, and HLD. He presented to the ED with L sided weakness, L facial droop, and slurring of speech. CT reveals age indeterminate R basal ganglia lacunar infarct.    OT comments  Focus of session on ADL training, educating pt in safety and compensatory strategies. Pt is progressing steadily. Continues to demonstrate poor safety awareness and impulsivity. He is highly motivated to return to independence and has excellent rehab potential. L UE continues to demonstrate significant hemiparesis.  Follow Up Recommendations  CIR;Supervision/Assistance - 24 hour    Equipment Recommendations  3 in 1 bedside commode;Tub/shower bench    Recommendations for Other Services      Precautions / Restrictions Precautions Precautions: Fall       Mobility Bed Mobility               General bed mobility comments: pt in chair  Transfers Overall transfer level: Needs assistance Equipment used: 1 person hand held assist Transfers: Sit to/from Stand Sit to Stand: Min assist         General transfer comment: required 2 attempts, assist for balance    Balance Overall balance assessment: Needs assistance   Sitting balance-Leahy Scale: Fair Sitting balance - Comments: LOB back and to the L with pericare in sitting     Standing balance-Leahy Scale: Poor                             ADL either performed or assessed with clinical judgement   ADL Overall ADL's : Needs assistance/impaired     Grooming: Minimal assistance;Sitting;Wash/dry hands;Wash/dry face   Upper Body Bathing: Minimal assistance;Sitting   Lower Body Bathing: Minimal assistance;Sit to/from stand;Sitting/lateral leans   Upper Body Dressing : Minimal  assistance;Sitting   Lower Body Dressing: Minimal assistance;Sit to/from stand       Toileting- Architect and Hygiene: Minimal assistance;Sit to/from stand         General ADL Comments: pt performed sponge bathing and dressing seated in chair, leaning side to side and with assist to stand.      Vision       Perception     Praxis      Cognition Arousal/Alertness: Awake/alert Behavior During Therapy: Impulsive Overall Cognitive Status: History of cognitive impairments - at baseline Area of Impairment: Safety/judgement                         Safety/Judgement: Decreased awareness of safety     General Comments: pt reports he is a fast mover at baseline        Exercises     Shoulder Instructions       General Comments      Pertinent Vitals/ Pain       Pain Assessment: No/denies pain Faces Pain Scale: Hurts a little bit Pain Intervention(s): Monitored during session;Repositioned  Home Living       Type of Home: House                                  Prior Functioning/Environment              Frequency  Min  3X/week        Progress Toward Goals  OT Goals(current goals can now be found in the care plan section)  Progress towards OT goals: Progressing toward goals  Acute Rehab OT Goals Patient Stated Goal: Go to rehab if insurance will pay for it  OT Goal Formulation: With patient/family Time For Goal Achievement: 12/19/16 Potential to Achieve Goals: Good  Plan Discharge plan remains appropriate    Co-evaluation                 AM-PAC PT "6 Clicks" Daily Activity     Outcome Measure   Help from another person eating meals?: A Little Help from another person taking care of personal grooming?: A Little Help from another person toileting, which includes using toliet, bedpan, or urinal?: A Little Help from another person bathing (including washing, rinsing, drying)?: A Little Help from another  person to put on and taking off regular upper body clothing?: A Little Help from another person to put on and taking off regular lower body clothing?: A Little 6 Click Score: 18    End of Session Equipment Utilized During Treatment: Gait belt  OT Visit Diagnosis: Hemiplegia and hemiparesis;Unsteadiness on feet (R26.81);Other symptoms and signs involving cognitive function Hemiplegia - Right/Left: Left Hemiplegia - dominant/non-dominant: Non-Dominant Hemiplegia - caused by: Cerebral infarction   Activity Tolerance Patient tolerated treatment well   Patient Left in chair;with call bell/phone within reach;with chair alarm set;with family/visitor present   Nurse Communication          Time: 0940-1007 OT Time Calculation (min): 27 min  Charges: OT General Charges $OT Visit: 1 Procedure OT Treatments $Self Care/Home Management : 23-37 mins     Evern Bio 12/07/2016, 10:19 AM  [phone]

## 2016-12-07 NOTE — Progress Notes (Signed)
Meredith Staggers, MD Physician Signed Physical Medicine and Rehabilitation  Consult Note Date of Service: 12/06/2016 9:46 AM  Related encounter: ED to Hosp-Admission (Current) from 12/04/2016 in [place] Collapse All   [] Hide copied text [] Hover for attribution information      Physical Medicine and Rehabilitation Consult   Reason for Consult:left hemiparesis Referring Physician: Doyle Askew   HPI: Brandon Martin is a 51 y.o. male with history of CAD s/p CABG, [name], gastric ulcers, tobacco use;  who was admitted on 12/04/16 with LUE weakness progressing to LLE weakness, left facial droop and slurred speech as day progressed. He was found to have right lacunar infarct and    Review of Systems  Constitutional: Negative for fever.  HENT: Negative for hearing loss.   Eyes: Negative for blurred vision.  Respiratory: Negative for cough.   Cardiovascular: Negative for chest pain.  Gastrointestinal: Negative for heartburn.  Genitourinary: Negative for dysuria.  Musculoskeletal: Negative for myalgias.  Skin: Negative for rash.  Neurological: Positive for speech change and focal weakness. Negative for dizziness.          Past Medical History:  Diagnosis Date  . Coronary artery disease   . GERD (gastroesophageal reflux disease)   . Hyperlipidemia   . Hypertension   . Pleural effusion   . Tobacco use disorder          Past Surgical History:  Procedure Laterality Date  . CORONARY ARTERY BYPASS GRAFT  x 3         Family History  Problem Relation Age of Onset  . Hypertension Mother   . Arthritis Mother   . Cancer Father   . Transient ischemic attack Sister     Social History:  reports that he has been smoking Cigarettes.  He has quit using smokeless tobacco. He reports that he does not drink alcohol or use drugs.          Allergies  Allergen Reactions  . Aspirin Other (See  Comments)    HX of gastric ulcers  . Morphine And Related Nausea And Vomiting  . Latex Rash         Medications Prior to Admission  Medication Sig Dispense Refill  . metoprolol tartrate (LOPRESSOR) 25 MG tablet Take 1 tablet (25 mg total) by mouth 2 (two) times daily. 60 tablet 1  . nitroGLYCERIN (NITROSTAT) 0.4 MG SL tablet Place 1 tablet (0.4 mg total) under the tongue every 5 (five) minutes as needed for chest pain. (Patient not taking: Reported on 12/04/2016) 25 tablet 11    Home: Home Living Family/patient expects to be discharged to:: Private residence Living Arrangements: Parent, Other relatives (brother) Available Help at Discharge: Family, Available 24 hours/day Type of Home: House Home Access: Stairs to enter [employer] of Steps: 2 steps down Entrance Stairs-Rails: None Home Layout: One level Bathroom Shower/Tub: Chiropodist: Standard Bathroom Accessibility: Yes Home Equipment: Careers adviser History: Prior Function Level of Independence: Independent Functional Status:  Mobility: Bed Mobility Overal bed mobility: Needs Assistance Bed Mobility: Supine to Sit Supine to sit: Min guard General bed mobility comments: Min guard for safety. Transfers Overall transfer level: Needs assistance Equipment used: 1 person hand held assist Transfers: Sit to/from Stand Sit to Stand: Min assist General transfer comment: Min assist for boost and balance, leaning to the left. VC for awareness and to regain balance once upright. Ambulation/Gait Ambulation/Gait assistance:  Mod assist Ambulation Distance (Feet): 15 Feet Assistive device: 1 person hand held assist Gait Pattern/deviations: Step-through pattern, Decreased stride length, Decreased weight shift to right, Ataxic, Narrow base of support, Staggering left General Gait Details: Mostly ambulating with min assist hand held support, but occasionally with left sided loss of balance  requiring up to mod assist for recovery. No buckling noted. VC for sequencing, weight shift, awareness, and upright stance. Tolerated some dynamic tasks such as high knees, backwards steps, and turning but required additional assistance with each task. Gait velocity: decreased Gait velocity interpretation: <1.8 ft/sec, indicative of risk for recurrent falls  ADL: ADL Overall ADL's : Needs assistance/impaired Eating/Feeding: Minimal assistance, Sitting Grooming: Minimal assistance, Sitting Upper Body Bathing: Moderate assistance, Sitting Lower Body Bathing: Maximal assistance, Sit to/from stand Upper Body Dressing : Moderate assistance, Sitting Lower Body Dressing: Maximal assistance, Sit to/from stand Toilet Transfer: Minimal assistance, Ambulation Toilet Transfer Details (indicate cue type and reason): Pt reaching for furniture unsafely. Toileting- Clothing Manipulation and Hygiene: Moderate assistance, Sit to/from stand Functional mobility during ADLs: Minimal assistance General ADL Comments: Pt unsafe during ambulation, reaching for rolling furniture and IV pole for stability and requiring VC's to prevent this to improve safety.   Cognition: Cognition Overall Cognitive Status: Impaired/Different from baseline Orientation Level: Oriented X4 Cognition Arousal/Alertness: Awake/alert Behavior During Therapy: WFL for tasks assessed/performed Overall Cognitive Status: Impaired/Different from baseline Area of Impairment: Safety/judgement Safety/Judgement: Decreased awareness of safety General Comments: Pt with poor safety awareness throughout with decreased understanding of impact of deficits on safety and independence.   Blood pressure (!) 156/96, pulse 84, temperature 97.7 F (36.5 C), temperature source Oral, resp. rate 18, height 5\' 8"  (1.727 m), weight 92.6 kg (204 lb 3.2 oz), SpO2 97 %. Physical Exam  Constitutional: He is oriented to person, place, and time. He appears  well-developed.  HENT:  Head: Normocephalic.  Eyes: Pupils are equal, round, and reactive to light.  Neck: Normal range of motion.  Cardiovascular: Normal rate.   Respiratory: Effort normal.  GI: He exhibits no distension.  Musculoskeletal: He exhibits no edema.  Left shoulder pain/weakness due to RTC injury  Neurological: He is alert and oriented to person, place, and time. Coordination abnormal.  Alert. Left central 7. Speech slightly dysarthric. LUE: 2/5 deltoid, 3/5 biceps, triceps, and wrist/hand. LLE: 3+ to 4/5 HF, KE and ADF/PF. No sensory deficits.     Lab Results Last 24 Hours       Results for orders placed or performed during the hospital encounter of 12/04/16 (from the past 24 hour(s))  CBC     Status: None   Collection Time: 12/06/16  5:02 AM  Result Value Ref Range   WBC 9.9 4.0 - 10.5 K/uL   RBC 4.94 4.22 - 5.81 MIL/uL   Hemoglobin 15.5 13.0 - 17.0 g/dL   HCT 45.6 39.0 - 52.0 %   MCV 92.3 78.0 - 100.0 fL   MCH 31.4 26.0 - 34.0 pg   MCHC 34.0 30.0 - 36.0 g/dL   RDW 13.0 11.5 - 15.5 %   Platelets 210 150 - 400 K/uL  Basic metabolic panel     Status: Abnormal   Collection Time: 12/06/16  5:02 AM  Result Value Ref Range   Sodium 138 135 - 145 mmol/L   Potassium 3.7 3.5 - 5.1 mmol/L   Chloride 102 101 - 111 mmol/L   CO2 24 22 - 32 mmol/L   Glucose, Bld 112 (H) 65 - 99 mg/dL  BUN 12 6 - 20 mg/dL   Creatinine, Ser 0.95 0.61 - 1.24 mg/dL   Calcium 9.1 8.9 - 10.3 mg/dL   GFR calc non Af Amer >60 >60 mL/min   GFR calc Af Amer >60 >60 mL/min   Anion gap 12 5 - 15      Imaging Results (Last 48 hours)  Dg Chest 2 View  Result Date: 12/05/2016 CLINICAL DATA:  No chest complaints. EXAM: CHEST  2 VIEW COMPARISON:  Nov 16, 2012 FINDINGS: Pleural thickening versus tiny pleural effusions, unchanged. The cardiomediastinal silhouette is stable. The lungs remain clear. IMPRESSION: No interval change. Pleural thickening versus tiny pleural effusions.  Electronically Signed   By: Dorise Bullion III M.D   On: 12/05/2016 14:42   Mr Brain Wo Contrast  Result Date: 12/05/2016 CLINICAL DATA:  LEFT-sided numbness and weakness began at 10 a.m. 12/04/2016. Stroke risk factors include hypertension, hyperlipidemia, coronary artery disease, prior TIA, and medication noncompliance. EXAM: MRI HEAD WITHOUT CONTRAST MRA HEAD WITHOUT CONTRAST TECHNIQUE: Multiplanar, multiecho pulse sequences of the brain and surrounding structures were obtained without intravenous contrast. Angiographic images of the head were obtained using MRA technique without contrast. COMPARISON:  Code stroke CT head 12/04/2016. FINDINGS: MRI HEAD FINDINGS Brain: Restricted diffusion, RIGHT lentiform nucleus extending to the RIGHT centrum semiovale and periventricular white matter, consistent with acute nonhemorrhagic infarction. RIGHT MCA lenticulostriate territory involvement. There is a chronic lacunar infarct involving the RIGHT putamen, identified on CT, without restriction or hemorrhage. Normal for age cerebral volume. Mild subcortical and periventricular T2 and FLAIR hyperintensities, likely chronic microvascular ischemic change. No mass lesion, hydrocephalus, or extra-axial fluid. Vascular: Reported separately. Skull and upper cervical spine: Normal marrow signal. Sinuses/Orbits: Negative. Other: None. Compared with the code stroke CT, the acute infarct is not visible. MRA HEAD FINDINGS The internal carotid arteries are widely patent. No stenosis or dissection. The basilar artery is widely patent. Vertebrals are codominant and widely patent. There is no stenosis of the anterior, middle, or posterior cerebral arteries. Slight irregularity distal MCA and PCA branches suggest intracranial atherosclerotic change. No cerebellar branch occlusion.  No saccular aneurysm. IMPRESSION: Acute RIGHT MCA lenticulostriate territory infarct, nonhemorrhagic affecting the lentiform nucleus and adjacent white  matter. Chronic RIGHT basal ganglia lacune. Mild chronic microvascular ischemic change. Negative MRA intracranial circulation. Electronically Signed   By: Staci Righter M.D.   On: 12/05/2016 14:23   Mr Jodene Nam Head/brain MW Cm  Result Date: 12/05/2016 CLINICAL DATA:  LEFT-sided numbness and weakness began at 10 a.m. 12/04/2016. Stroke risk factors include hypertension, hyperlipidemia, coronary artery disease, prior TIA, and medication noncompliance. EXAM: MRI HEAD WITHOUT CONTRAST MRA HEAD WITHOUT CONTRAST TECHNIQUE: Multiplanar, multiecho pulse sequences of the brain and surrounding structures were obtained without intravenous contrast. Angiographic images of the head were obtained using MRA technique without contrast. COMPARISON:  Code stroke CT head 12/04/2016. FINDINGS: MRI HEAD FINDINGS Brain: Restricted diffusion, RIGHT lentiform nucleus extending to the RIGHT centrum semiovale and periventricular white matter, consistent with acute nonhemorrhagic infarction. RIGHT MCA lenticulostriate territory involvement. There is a chronic lacunar infarct involving the RIGHT putamen, identified on CT, without restriction or hemorrhage. Normal for age cerebral volume. Mild subcortical and periventricular T2 and FLAIR hyperintensities, likely chronic microvascular ischemic change. No mass lesion, hydrocephalus, or extra-axial fluid. Vascular: Reported separately. Skull and upper cervical spine: Normal marrow signal. Sinuses/Orbits: Negative. Other: None. Compared with the code stroke CT, the acute infarct is not visible. MRA HEAD FINDINGS The internal carotid arteries are widely patent. No  stenosis or dissection. The basilar artery is widely patent. Vertebrals are codominant and widely patent. There is no stenosis of the anterior, middle, or posterior cerebral arteries. Slight irregularity distal MCA and PCA branches suggest intracranial atherosclerotic change. No cerebellar branch occlusion.  No saccular aneurysm.  IMPRESSION: Acute RIGHT MCA lenticulostriate territory infarct, nonhemorrhagic affecting the lentiform nucleus and adjacent white matter. Chronic RIGHT basal ganglia lacune. Mild chronic microvascular ischemic change. Negative MRA intracranial circulation. Electronically Signed   By: Staci Righter M.D.   On: 12/05/2016 14:23   Ct Head Code Stroke Wo Contrast`  Result Date: 12/04/2016 CLINICAL DATA:  Code stroke. LEFT-sided numbness beginning at 10 a.m., facial droop. History of hypertension, hyperlipidemia. EXAM: CT HEAD WITHOUT CONTRAST TECHNIQUE: Contiguous axial images were obtained from the base of the skull through the vertex without intravenous contrast. COMPARISON:  None. FINDINGS: BRAIN: 5 mm hypodensity RIGHT putaminal (axial 16/32). No intraparenchymal hemorrhage, mass effect nor midline shift. The ventricles and sulci are normal. No acute large vascular territory infarcts. No abnormal extra-axial fluid collections. Basal cisterns are patent. VASCULAR: Trace calcific atherosclerosis LEFT vertebral artery. SKULL/SOFT TISSUES: No skull fracture. No significant soft tissue swelling. ORBITS/SINUSES: The included ocular globes and orbital contents are normal.Moderate maxillary sinus mucosal thickening. Mastoid air cells are well aerated. OTHER: None. ASPECTS [facility] Stroke Program Early CT Score) - Ganglionic level infarction (caudate, lentiform nuclei, internal capsule, insula, M1-M3 cortex): 6 - Supraganglionic infarction (M4-M6 cortex): 3 Total score (0-10 with 10 being normal): 9 IMPRESSION: 1. Age indeterminate RIGHT basal ganglia lacunar infarct. 2. ASPECTS is 9. Critical Value/emergent results were called by telephone at the time of interpretation on 12/04/2016 at 9:52 pm to Dr. Leonette Monarch, who verbally acknowledged these results. Electronically Signed   By: Elon Alas M.D.   On: 12/04/2016 21:53     Assessment/Plan: Diagnosis: right basal ganglia infarct with left hemiparesis 1. Does  the need for close, 24 hr/day medical supervision in concert with the patient's rehab needs make it unreasonable for this patient to be served in a less intensive setting? Yes 2. Co-Morbidities requiring supervision/potential complications: GERD, post -stroke sequelae 3. Due to bladder management, bowel management, safety, skin/wound care, disease management, medication administration, pain management and patient education, does the patient require 24 hr/day rehab nursing? Yes 4. Does the patient require coordinated care of a physician, rehab nurse, PT (1-2 hrs/day, 5 days/week), OT (1-2 hrs/day, 5 days/week) and SLP (1-2 hrs/day, 5 days/week) to address physical and functional deficits in the context of the above medical diagnosis(es)? Yes Addressing deficits in the following areas: balance, endurance, locomotion, strength, transferring, bowel/bladder control, bathing, dressing, feeding, grooming, toileting, speech and psychosocial support 5. Can the patient actively participate in an intensive therapy program of at least 3 hrs of therapy per day at least 5 days per week? Yes 6. The potential for patient to make measurable gains while on inpatient rehab is excellent 7. Anticipated functional outcomes upon discharge from inpatient rehab are modified independent  with PT, modified independent with OT, modified independent with SLP. 8. Estimated rehab length of stay to reach the above functional goals is: 7 days 9. Anticipated D/C setting: Home 10. Anticipated post D/C treatments: HH therapy and Outpatient therapy 11. Overall Rehab/Functional Prognosis: excellent  RECOMMENDATIONS: This patient's condition is appropriate for continued rehabilitative care in the following setting: CIR Patient has agreed to participate in recommended program. Yes Note that insurance prior authorization may be required for reimbursement for recommended care.  Comment: Rehab Admissions Coordinator  to follow  up.  Thanks,  Meredith Staggers, MD, [place], [place] 12/06/2016    Revision History                        Routing History

## 2016-12-07 NOTE — Progress Notes (Signed)
Inpatient Rehabilitation  I met with Brandon Martin , his mother and his brother Brandon Martin at the bedside to discuss the recommendation for IP rehab.  I provided information about the rehab program, provided informational booklets and answered their questions.  Pt. is in agreement that he needs and wants IP Rehab.  I have initiated the insurance authorization process and await a decision from [facility] for a potential CIR admission.  I can admit pt. today PENDING timing of and approval from insurance.  Please call if questions.  [place] Admissions Coordinator Cell [phone] Office [phone]

## 2016-12-07 NOTE — Progress Notes (Addendum)
  Echocardiogram 2D Echocardiogram with definity has been performed. Technically difficult study due to patient sensitivity in apical region.   Brandon Martin 12/07/2016, 1:22 PM

## 2016-12-07 NOTE — Progress Notes (Signed)
STROKE TEAM PROGRESS NOTE   SUBJECTIVE (INTERVAL HISTORY) His mother is at bedside.Patient states his improving left-sided strength is better.   OBJECTIVE Temp:  [97.3 F (36.3 C)-98 F (36.7 C)] 97.9 F (36.6 C) (05/29 0423) Pulse Rate:  [79-104] 79 (05/29 0423) Cardiac Rhythm: Normal sinus rhythm;Bundle branch block (05/29 0700) Resp:  [17-18] 17 (05/29 0423) BP: (157-181)/(91-107) 157/91 (05/29 0423) SpO2:  [97 %-99 %] 98 % (05/29 0423)  CBC:  Recent Labs Lab 12/04/16 2126  12/05/16 0138 12/06/16 0502  WBC 10.9*  --  8.9 9.9  NEUTROABS 6.0  --   --   --   HGB 16.1  < > 15.6 15.5  HCT 45.9  < > 45.5 45.6  MCV 90.9  --  91.0 92.3  PLT 239  --  217 210  < > = values in this interval not displayed.  Basic Metabolic Panel:   Recent Labs Lab 12/04/16 2126 12/04/16 2137 12/05/16 0138 12/06/16 0502  NA 138 135  --  138  K 3.5 3.5  --  3.7  CL 104 102  --  102  CO2 22  --   --  24  GLUCOSE 117* 114*  --  112*  BUN 16 17  --  12  CREATININE 0.97 1.00 0.93 0.95  CALCIUM 9.6  --   --  9.1   HgbA1c:  Lab Results  Component Value Date   HGBA1C 6.0 (H) 12/05/2016    PHYSICAL EXAM Pleasant middle aged male not in distress. . Afebrile. Head is nontraumatic. Neck is supple without bruit.    Cardiac exam no murmur or gallop. Lungs are clear to auscultation. Distal pulses are well felt. Neurological Exam :  Awake alert oriented x 3 normal speech and language. Mild left lower face asymmetry. Tongue midline. Mild LUE drift. Mild weakness of left grip and diminished fine finger movements on left. Orbits right over left upper extremity. Mild left hip flexor weakness.. Slightly diminished left hemibody sensation . Normal coordination. Gait deferred ASSESSMENT/PLAN Mr. LEV CERVONE is a 51 y.o. male with history of HTN, CAD s/p CABG, HLD, gastric ulcers presenting with L sided weakness, facial droop, slurred speech, L hemisensory deficit, unsteady gait and blurred vision. He did not  receive IV t-PA.  Stroke:   R MCA lenticulostriate infarct  secondary to small vessel disease    Resultant  L HP, L hemisensory deficit  Code Stroke CT old R BG lacune. Aspects 9.  MRI  R MCA lenticulostriate infarct. Old R BG lacune. Small vessel disease.   MRA  Unremarkable   Carotid Doppler  pending  2D Echo  Left ventricle: The cavity size was normal. Wall thickness was   increased in a pattern of moderate LVH. Systolic function was   normal. The estimated ejection fraction was in the range of 55%   to 60%. Wall motion was normal; there were no regional wall    motion abnormalities.  LDL 124  HgbA1c 6.0  Lovenox 40 mg sq daily for VTE prophylaxis DIET SOFT Room service appropriate? Yes; Fluid consistency: Thin Diet - low sodium heart healthy  No antithrombotic prior to admission though he did take a chewable aspirin prior to coming to the ED, now on Plavix 75 mg daily  Therapy recommendations:  CIR  Disposition:  pending   Hypertension  Stable Permissive hypertension (OK if < 220/120) but gradually normalize in 5-7 days Long-term BP goal normotensive  Hyperlipidemia  Home meds:  No statin  LDL 124 goal  Add statin  Continue statin at discharge  Other Stroke Risk Factors  Cigarette smoker, advised to stop smoking  UDS / ETOH level not performed   Obesity, Body mass index is 31.05 kg/m.  Family Hx TIA (sister)  Coronary artery disease s/p CABG  Hospital day # 3  I have personally examined this patient, reviewed notes, independently viewed imaging studies, participated in medical decision making and plan of care.ROS completed by me personally and pertinent positives fully documented  I have made any additions or clarifications directly to the above note. The patient presented with left hemiparesis secondary to small right brain subcortical infarct from small vessel disease. Recommend continue Plavix for stroke prevention. Patient counseled to quit  smoking. Maintain aggressive risk factor modification. Transfer to inpatient rehab when bed available. Follow-up as an outpatient in the stroke clinic in 6 weeks. Stroke team will sign off. Kindly call for questions. Antony Contras, MD Medical Director [facility] Pager: [phone] 12/07/2016 3:00 PM   To contact Stroke Continuity provider, please refer to [email or web address] After hours, contact General Neurology

## 2016-12-07 NOTE — Progress Notes (Signed)
Pt arrived to unit at approximately 5:15 pm. Pt is alert and oriented and complains of no pain. Pt is resting comfortably in bed and vitals are within his defined limits. Admit to inpatient rehab.

## 2016-12-07 NOTE — Care Management Note (Signed)
Case Management Note  Patient Details  Name: Brandon Martin MRN: [id number] Date of Birth: 02/25/1966  Subjective/Objective:           Admitted with CVA.        Iris Pert (Mother) [name] [phone]     PCP: Christella Noa  Action/Plan: Plan is to d/c to Inpatient Rehab.today.  Expected Discharge Date:    12/07/2016           Expected Discharge Plan:  [place]  In-House Referral:     Discharge planning Services  CM Consult   Status of Service:  Completed, signed off  If discussed at Long Length of Stay Meetings, dates discussed:    Additional Comments:  Sharin Mons, RN 12/07/2016, 2:25 PM

## 2016-12-07 NOTE — Discharge Summary (Addendum)
Physician Discharge Summary  HOLMAN MARATEA ZOX:[id number] DOB: October 20, 1965 DOA: 12/04/2016  PCP: Joycelyn Rua, MD  Admit date: 12/04/2016 Discharge date: 12/07/2016  Recommendations for Outpatient Follow-up:  1. Pt will need to follow up with PCP in 1-2 weeks post discharge 2. Patient to be discharged to inpatient rehab if insurance approves  3. Aspirin changed to plavix   Discharge Diagnoses:  Active Problems:   Coronary artery disease   Hyperlipidemia   Tobacco use disorder   Stroke (cerebrum) (HCC)   Discharge Condition: Stable  Diet recommendation: Heart healthy diet discussed in details   History of present illness:  Pt is 51 yo male with known HTN, CAD s/p CABG x 3, HLD, presented with left sided weakness, upper extremity > lower extremity, left facial droop and slurred speech.   Assessment & Plan:   Active Problems: Acute left hemiparesis, facial droop and dysarthria  - per CT head, age indeterminate right basal ganglia lacunar infarct  - MRI brain confirms acute right MCA lenticulostriate area stroke - pt reports feeling better, still with LUE weakness  - ECHO results pending - A1C is 6, diet discussed with patient  - aspirin changed to Plavix     Coronary artery disease - no anginal symptoms     Hyperlipidemia, HTN - keep on statin  - started Norvasc     Tobacco use disorder - cessation consultation provided   DVT prophylaxis: Lovenox SQ Code Status: Full  Family Communication: Patient at bedside  Disposition Plan: IR  Consultants:   Neurology   Inpatient rehab doctor   Procedures:   None  Antimicrobials:   None  Procedures/Studies: Dg Chest 2 View  Result Date: 12/05/2016 CLINICAL DATA:  No chest complaints. EXAM: CHEST  2 VIEW COMPARISON:  Nov 16, 2012 FINDINGS: Pleural thickening versus tiny pleural effusions, unchanged. The cardiomediastinal silhouette is stable. The lungs remain clear. IMPRESSION: No interval change. Pleural  thickening versus tiny pleural effusions. Electronically Signed   By: Gerome Sam III M.D   On: 12/05/2016 14:42   Mr Brain Wo Contrast  Result Date: 12/05/2016 CLINICAL DATA:  LEFT-sided numbness and weakness began at 10 a.m. 12/04/2016. Stroke risk factors include hypertension, hyperlipidemia, coronary artery disease, prior TIA, and medication noncompliance. EXAM: MRI HEAD WITHOUT CONTRAST MRA HEAD WITHOUT CONTRAST TECHNIQUE: Multiplanar, multiecho pulse sequences of the brain and surrounding structures were obtained without intravenous contrast. Angiographic images of the head were obtained using MRA technique without contrast. COMPARISON:  Code stroke CT head 12/04/2016. FINDINGS: MRI HEAD FINDINGS Brain: Restricted diffusion, RIGHT lentiform nucleus extending to the RIGHT centrum semiovale and periventricular white matter, consistent with acute nonhemorrhagic infarction. RIGHT MCA lenticulostriate territory involvement. There is a chronic lacunar infarct involving the RIGHT putamen, identified on CT, without restriction or hemorrhage. Normal for age cerebral volume. Mild subcortical and periventricular T2 and FLAIR hyperintensities, likely chronic microvascular ischemic change. No mass lesion, hydrocephalus, or extra-axial fluid. Vascular: Reported separately. Skull and upper cervical spine: Normal marrow signal. Sinuses/Orbits: Negative. Other: None. Compared with the code stroke CT, the acute infarct is not visible. MRA HEAD FINDINGS The internal carotid arteries are widely patent. No stenosis or dissection. The basilar artery is widely patent. Vertebrals are codominant and widely patent. There is no stenosis of the anterior, middle, or posterior cerebral arteries. Slight irregularity distal MCA and PCA branches suggest intracranial atherosclerotic change. No cerebellar branch occlusion.  No saccular aneurysm. IMPRESSION: Acute RIGHT MCA lenticulostriate territory infarct, nonhemorrhagic affecting the  lentiform nucleus and  adjacent white matter. Chronic RIGHT basal ganglia lacune. Mild chronic microvascular ischemic change. Negative MRA intracranial circulation. Electronically Signed   By: Elsie Stain M.D.   On: 12/05/2016 14:23   Mr Maxine Glenn Head/brain UE Cm  Result Date: 12/05/2016 CLINICAL DATA:  LEFT-sided numbness and weakness began at 10 a.m. 12/04/2016. Stroke risk factors include hypertension, hyperlipidemia, coronary artery disease, prior TIA, and medication noncompliance. EXAM: MRI HEAD WITHOUT CONTRAST MRA HEAD WITHOUT CONTRAST TECHNIQUE: Multiplanar, multiecho pulse sequences of the brain and surrounding structures were obtained without intravenous contrast. Angiographic images of the head were obtained using MRA technique without contrast. COMPARISON:  Code stroke CT head 12/04/2016. FINDINGS: MRI HEAD FINDINGS Brain: Restricted diffusion, RIGHT lentiform nucleus extending to the RIGHT centrum semiovale and periventricular white matter, consistent with acute nonhemorrhagic infarction. RIGHT MCA lenticulostriate territory involvement. There is a chronic lacunar infarct involving the RIGHT putamen, identified on CT, without restriction or hemorrhage. Normal for age cerebral volume. Mild subcortical and periventricular T2 and FLAIR hyperintensities, likely chronic microvascular ischemic change. No mass lesion, hydrocephalus, or extra-axial fluid. Vascular: Reported separately. Skull and upper cervical spine: Normal marrow signal. Sinuses/Orbits: Negative. Other: None. Compared with the code stroke CT, the acute infarct is not visible. MRA HEAD FINDINGS The internal carotid arteries are widely patent. No stenosis or dissection. The basilar artery is widely patent. Vertebrals are codominant and widely patent. There is no stenosis of the anterior, middle, or posterior cerebral arteries. Slight irregularity distal MCA and PCA branches suggest intracranial atherosclerotic change. No cerebellar branch  occlusion.  No saccular aneurysm. IMPRESSION: Acute RIGHT MCA lenticulostriate territory infarct, nonhemorrhagic affecting the lentiform nucleus and adjacent white matter. Chronic RIGHT basal ganglia lacune. Mild chronic microvascular ischemic change. Negative MRA intracranial circulation. Electronically Signed   By: Elsie Stain M.D.   On: 12/05/2016 14:23   Ct Head Code Stroke Wo Contrast`  Result Date: 12/04/2016 CLINICAL DATA:  Code stroke. LEFT-sided numbness beginning at 10 a.m., facial droop. History of hypertension, hyperlipidemia. EXAM: CT HEAD WITHOUT CONTRAST TECHNIQUE: Contiguous axial images were obtained from the base of the skull through the vertex without intravenous contrast. COMPARISON:  None. FINDINGS: BRAIN: 5 mm hypodensity RIGHT putaminal (axial 16/32). No intraparenchymal hemorrhage, mass effect nor midline shift. The ventricles and sulci are normal. No acute large vascular territory infarcts. No abnormal extra-axial fluid collections. Basal cisterns are patent. VASCULAR: Trace calcific atherosclerosis LEFT vertebral artery. SKULL/SOFT TISSUES: No skull fracture. No significant soft tissue swelling. ORBITS/SINUSES: The included ocular globes and orbital contents are normal.Moderate maxillary sinus mucosal thickening. Mastoid air cells are well aerated. OTHER: None. ASPECTS [facility] Stroke Program Early CT Score) - Ganglionic level infarction (caudate, lentiform nuclei, internal capsule, insula, M1-M3 cortex): 6 - Supraganglionic infarction (M4-M6 cortex): 3 Total score (0-10 with 10 being normal): 9 IMPRESSION: 1. Age indeterminate RIGHT basal ganglia lacunar infarct. 2. ASPECTS is 9. Critical Value/emergent results were called by telephone at the time of interpretation on 12/04/2016 at 9:52 pm to Dr. Eudelia Bunch, who verbally acknowledged these results. Electronically Signed   By: Awilda Metro M.D.   On: 12/04/2016 21:53    Discharge Exam: Vitals:   12/07/16 0045 12/07/16 0423  BP:  (!) 177/107 (!) 157/91  Pulse: 93 79  Resp: 18 17  Temp: 97.3 F (36.3 C) 97.9 F (36.6 C)   Vitals:   12/06/16 1501 12/06/16 2030 12/07/16 0045 12/07/16 0423  BP: (!) 181/98 (!) 167/102 (!) 177/107 (!) 157/91  Pulse: (!) 104 94 93 79  Resp:  18 18 18 17   Temp: 97.4 F (36.3 C) 98 F (36.7 C) 97.3 F (36.3 C) 97.9 F (36.6 C)  TempSrc: Oral     SpO2: 99% 97% 98% 98%  Weight:      Height:        General: Pt is alert, follows commands appropriately, not in acute distress Cardiovascular: Regular rate and rhythm, S1/S2 +, no murmurs, no rubs, no gallops Respiratory: Clear to auscultation bilaterally, no wheezing, no crackles, no rhonchi Abdominal: Soft, non tender, non distended, bowel sounds +, no guarding Extremities: no edema, no cyanosis, pulses palpable bilaterally DP and PT Neuro: Grossly nonfocal  Discharge Instructions   Allergies as of 12/07/2016      Reactions   Aspirin Other (See Comments)   HX of gastric ulcers   Morphine And Related Nausea And Vomiting   Latex Rash      Medication List    STOP taking these medications   metoprolol tartrate 25 MG tablet Commonly known as:  LOPRESSOR   nitroGLYCERIN 0.4 MG SL tablet Commonly known as:  NITROSTAT     TAKE these medications   amLODipine 5 MG tablet Commonly known as:  NORVASC Take 1 tablet (5 mg total) by mouth daily.   atorvastatin 20 MG tablet Commonly known as:  LIPITOR Take 1 tablet (20 mg total) by mouth daily at 6 PM.   clopidogrel 75 MG tablet Commonly known as:  PLAVIX Take 1 tablet (75 mg total) by mouth daily. Start taking on:  12/08/2016   traMADol 50 MG tablet Commonly known as:  ULTRAM Take 1 tablet (50 mg total) by mouth every 6 (six) hours as needed for moderate pain.   traZODone 50 MG tablet Commonly known as:  DESYREL Take 1 tablet (50 mg total) by mouth at bedtime as needed for sleep.         Follow-up Information    Joycelyn Rua, MD Follow up.   Specialty:  Family  Medicine Contact information: [address] [place] [address] [phone]        Dorothea Ogle, MD Follow up.   Specialty:  Internal Medicine Contact information: [address] [place] [address] [phone]            The results of significant diagnostics from this hospitalization (including imaging, microbiology, ancillary and laboratory) are listed below for reference.     Microbiology: No results found for this or any previous visit (from the past 240 hour(s)).   Labs: Basic Metabolic Panel:  Recent Labs Lab 12/04/16 2126 12/04/16 2137 12/05/16 0138 12/06/16 0502  NA 138 135  --  138  K 3.5 3.5  --  3.7  CL 104 102  --  102  CO2 22  --   --  24  GLUCOSE 117* 114*  --  112*  BUN 16 17  --  12  CREATININE 0.97 1.00 0.93 0.95  CALCIUM 9.6  --   --  9.1   Liver Function Tests:  Recent Labs Lab 12/04/16 2126  AST 20  ALT 33  ALKPHOS 70  BILITOT 0.7  PROT 7.6  ALBUMIN 4.2   CBC:  Recent Labs Lab 12/04/16 2126 12/04/16 2137 12/05/16 0138 12/06/16 0502  WBC 10.9*  --  8.9 9.9  NEUTROABS 6.0  --   --   --   HGB 16.1 16.0 15.6 15.5  HCT 45.9 47.0 45.5 45.6  MCV 90.9  --  91.0 92.3  PLT 239  --  217 210    CBG:  Recent Labs Lab 12/04/16 2145 12/05/16 0046 12/05/16 0420  GLUCAP 112* 128* 122*   SIGNED: Time coordinating discharge:  30 minutes  Debbora Presto, MD  [facility] 12/07/2016, 10:02 AM Pager [phone]  If 7PM-7AM, please contact night-coverage [email or web address] Password [email or web address]

## 2016-12-07 NOTE — Progress Notes (Signed)
Inpatient Rehabilitation  I have insurance approval from pt's [facility] Medicare for IP Rehab admission.  I have medical clearance from Dr. Doyle Askew and will plan to admit pt. later today.  Please call if questions.  [place] Admissions Coordinator Cell [phone] Office [phone]

## 2016-12-08 ENCOUNTER — Inpatient Hospital Stay (HOSPITAL_COMMUNITY): Payer: Medicare Other | Admitting: Speech Pathology

## 2016-12-08 ENCOUNTER — Inpatient Hospital Stay (HOSPITAL_COMMUNITY): Payer: Medicare Other | Admitting: Occupational Therapy

## 2016-12-08 ENCOUNTER — Inpatient Hospital Stay (HOSPITAL_COMMUNITY): Payer: Medicare Other | Admitting: Physical Therapy

## 2016-12-08 DIAGNOSIS — I10 Essential (primary) hypertension: Secondary | ICD-10-CM

## 2016-12-08 DIAGNOSIS — I63311 Cerebral infarction due to thrombosis of right middle cerebral artery: Secondary | ICD-10-CM

## 2016-12-08 LAB — CBC WITH DIFFERENTIAL/PLATELET
Basophils Absolute: 0 10*3/uL (ref 0.0–0.1)
Basophils Relative: 0 %
Eosinophils Absolute: 0.2 10*3/uL (ref 0.0–0.7)
Eosinophils Relative: 2 %
HCT: 46.8 % (ref 39.0–52.0)
HEMOGLOBIN: 15.5 g/dL (ref 13.0–17.0)
LYMPHS ABS: 2.4 10*3/uL (ref 0.7–4.0)
LYMPHS PCT: 24 %
MCH: 30.9 pg (ref 26.0–34.0)
MCHC: 33.1 g/dL (ref 30.0–36.0)
MCV: 93.4 fL (ref 78.0–100.0)
Monocytes Absolute: 0.7 10*3/uL (ref 0.1–1.0)
Monocytes Relative: 7 %
NEUTROS ABS: 6.7 10*3/uL (ref 1.7–7.7)
NEUTROS PCT: 67 %
Platelets: 221 10*3/uL (ref 150–400)
RBC: 5.01 MIL/uL (ref 4.22–5.81)
RDW: 13.1 % (ref 11.5–15.5)
WBC: 10.1 10*3/uL (ref 4.0–10.5)

## 2016-12-08 LAB — COMPREHENSIVE METABOLIC PANEL
ALK PHOS: 63 U/L (ref 38–126)
ALT: 41 U/L (ref 17–63)
AST: 25 U/L (ref 15–41)
Albumin: 3.8 g/dL (ref 3.5–5.0)
Anion gap: 7 (ref 5–15)
BUN: 17 mg/dL (ref 6–20)
CALCIUM: 9.3 mg/dL (ref 8.9–10.3)
CO2: 29 mmol/L (ref 22–32)
Chloride: 100 mmol/L — ABNORMAL LOW (ref 101–111)
Creatinine, Ser: 1.09 mg/dL (ref 0.61–1.24)
Glucose, Bld: 135 mg/dL — ABNORMAL HIGH (ref 65–99)
Potassium: 4.2 mmol/L (ref 3.5–5.1)
Sodium: 136 mmol/L (ref 135–145)
Total Bilirubin: 1.1 mg/dL (ref 0.3–1.2)
Total Protein: 6.8 g/dL (ref 6.5–8.1)

## 2016-12-08 MED ORDER — AMLODIPINE BESYLATE 10 MG PO TABS
10.0000 mg | ORAL_TABLET | Freq: Every day | ORAL | Status: DC
  Administered 2016-12-09 – 2016-12-16 (×8): 10 mg via ORAL
  Filled 2016-12-08 (×8): qty 1

## 2016-12-08 MED ORDER — DICLOFENAC SODIUM 1 % TD GEL
2.0000 g | Freq: Three times a day (TID) | TRANSDERMAL | Status: DC
  Administered 2016-12-08 – 2016-12-16 (×24): 2 g via TOPICAL
  Filled 2016-12-08: qty 100

## 2016-12-08 MED ORDER — AMLODIPINE BESYLATE 5 MG PO TABS
5.0000 mg | ORAL_TABLET | Freq: Once | ORAL | Status: AC
  Administered 2016-12-08: 5 mg via ORAL
  Filled 2016-12-08: qty 1

## 2016-12-08 NOTE — Evaluation (Signed)
Speech Language Pathology Assessment and Plan  Patient Details  Name: Brandon Martin MRN: [id number] Date of Birth: 24-Nov-1965  Evaluation only   Today's Date: 12/08/2016 SLP Individual Time: 1500-1600 SLP Individual Time Calculation (min): 60 min   Problem List:  Patient Active Problem List   Diagnosis Date Noted  . Right basal ganglia embolic stroke (HCC) 12/07/2016  . Stroke (cerebrum) (HCC) 12/04/2016  . Tobacco use disorder   . Coronary artery disease   . GERD (gastroesophageal reflux disease)   . Hyperlipidemia   . Pleural effusion    Past Medical History:  Past Medical History:  Diagnosis Date  . Coronary artery disease   . GERD (gastroesophageal reflux disease)   . Hyperlipidemia   . Hypertension   . Pleural effusion   . Tobacco use disorder    Past Surgical History:  Past Surgical History:  Procedure Laterality Date  . CORONARY ARTERY BYPASS GRAFT  x 3    Assessment / Plan / Recommendation Clinical Impression Brandon Martin a 50 y.o.malewith history of CAD s/p CABG, [name], gastric ulcers, tobacco use.Per chart review patient lives with brother independent prior to admission. One level home. Assistance can be provided as needed. Admitted on 12/04/16 with LUE weakness progressing to LLE weakness, left facial droop and slurred speech as day progressed. CT MRI showed acute right MCA lenticulostriate territory infarct, nonhemorrhagic affecting the lentiform nucleusand adjacent white matter. Chronic right basal ganglia lacunar infarct. MRA negative. Patient did not receive TPA. Echocardiogram with ejection fraction of 60% grade 1 diastolic dysfunction. Carotid Doppler no ICA stenosis. Neurology consulted placed on Plavix for CVA prophylaxis. Tolerating a mechanical soft diet. Physical occupational therapy evaluations completed with recommendations of physical medicine rehabilitation consult. Pt admited to CIR on 12/07/16. Speech-language evaluation  completed on 12/08/16 with mild delay in cognitive abilites as evidenced by score of 23 out of 30 on [place] version 7/1 with n=>26. 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 Given this, pt's score and functional abilites are at baseline. Pt is able to participate in OT, PT and perform tasks within ST functionally. Pt demonstrates functional speech intelligibility at the conversation level.  No further skilled ST services indicated.     Skilled Therapeutic Interventions          Skilled session focused on speech-language evaluation, all education completed. No further services indicated.    SLP Assessment  Patient does not need any further Speech Lanaguage Pathology Services    Recommendations  Patient destination: Home Follow up Recommendations: None Equipment Recommended: None recommended by SLP           Pain    Prior Functioning Cognitive/Linguistic Baseline: Baseline deficits Baseline deficit details: pt describes abilities as mildly delayed, finished 8th grade Type of Home: House  Lives With: Family (Mother and brother) Available Help at Discharge: Family;Available 24 hours/day Education: 8th grade Vocation: On disability  Function:    Cognition Comprehension Comprehension assist level: Understands complex 90% of the time/cues 10% of the time  Expression   Expression assist level: Expresses complex ideas: With extra time/assistive device  Social Interaction Social Interaction assist level: Interacts appropriately with others with medication or extra time (anti-anxiety, antidepressant).  Problem Solving Problem solving assist level: Solves complex problems: With extra time  Memory Memory assist level: More than reasonable amount of time   Short Term Goals: No short term goals set  Refer to Care Plan for Long Term Goals  Recommendations for other services: None   Discharge  Criteria: Patient will be discharged from SLP if patient refuses treatment 3 consecutive times without medical reason, if treatment goals not met, if there is a change in medical status, if patient makes no progress towards goals or if patient is discharged from hospital.  The above assessment, treatment plan, treatment alternatives and goals were discussed and mutually agreed upon: by patient   Brandon Martin, M.S., CCC-SLP Speech-Language Pathologist   Brandon Martin 12/08/2016, 4:06 PM

## 2016-12-08 NOTE — Progress Notes (Addendum)
Brandon Martin PHYSICAL MEDICINE & REHABILITATION     PROGRESS NOTE    Subjective/Complaints: Belly feeling much better after moving bowels last night. Has appetite this morning. Left knee still tender  ROS: pt denies nausea, vomiting, diarrhea, cough, shortness of breath or chest pain   Objective: Vital Signs: Blood pressure (!) 170/94, pulse 98, temperature 98.2 F (36.8 C), temperature source Oral, resp. rate 19, height 5\' 8"  (1.727 m), weight 90.6 kg (199 lb 11.8 oz), SpO2 99 %. No results found.  Recent Labs  12/06/16 0502 12/08/16 0453  WBC 9.9 10.1  HGB 15.5 15.5  HCT 45.6 46.8  PLT 210 221    Recent Labs  12/06/16 0502 12/08/16 0453  NA 138 136  K 3.7 4.2  CL 102 100*  GLUCOSE 112* 135*  BUN 12 17  CREATININE 0.95 1.09  CALCIUM 9.1 9.3   CBG (last 3)  No results for input(s): GLUCAP in the last 72 hours.  Wt Readings from Last 3 Encounters:  12/08/16 90.6 kg (199 lb 11.8 oz)  12/05/16 92.6 kg (204 lb 3.2 oz)  11/17/12 93.3 kg (205 lb 9.6 oz)    Physical Exam:  HENT:  Dentition fair Eyes: PERRL Neck: Normal range of motion. Neck supple. No thyromegalypresent.  Cardiovascular: RRR Respiratory: normal effort. Scattered exp wheezes.  GI: Soft. Bowel sounds are normal. He exhibits no distension.  Skin. Warm and dry Musculoskeletal: small effusion around left knee. Some tenderness with PROM/AROM Left shoulder pain/weakness due to old RTC injury Neurological: He is alertand oriented to person, place, and time. Coordinationabnormal.  Alert. Left central 7 present. Speech remains slightly dysarthric. LUE: 2/5 deltoid, 3+/5 biceps, triceps, and wrist/hand. LLE: 3+ to 4/5 HF, KE and ADF/PF. No sensory deficits--motor and sensory exam unchanged Psych: pleasant  Assessment/Plan: 1. Functional and mobility deficits secondary to right MCA infarct which require 3+ hours per day of interdisciplinary therapy in a comprehensive inpatient rehab  setting. Physiatrist is providing close team supervision and 24 hour management of active medical problems listed below. Physiatrist and rehab team continue to assess barriers to discharge/monitor patient progress toward functional and medical goals.  Function:  Bathing Bathing position      Bathing parts      Bathing assist        Upper Body Dressing/Undressing Upper body dressing                    Upper body assist        Lower Body Dressing/Undressing Lower body dressing                                  Lower body assist        Toileting Toileting          Toileting assist     Transfers Chair/bed transfer             Locomotion Ambulation           Wheelchair          Cognition Comprehension    Expression Expression assist level: Expresses complex ideas: With extra time/assistive device  Social Interaction Social Interaction assist level: Interacts appropriately with others - No medications needed.  Problem Solving    Memory     Medical Problem List and Plan: 1. Left hemiparesis with facial droop and slurred speechsecondary to right MCA/lenticulostriate region infarct -begin therapies 2. DVT Prophylaxis/Anticoagulation: Subcutaneous Lovenox.  Monitor platelet counts and any signs of bleeding 3. Pain Management: Tramadol as needed  -voltaren,ice to left knee  -consider brace, xrays 4. Mood: Provide emotional support 5. Neuropsych: This patient iscapable of making decisions on hisown behalf. 6. Skin/Wound Care: Routine skin checks 7. Fluids/Electrolytes/Nutrition: good po intake  I personally reviewed all of the patient's labs today, and lab work is within normal limits.  8.Hypertension. Norvasc 5 mg daily.---needs better control. Increase to 10mg  9.History of CAD with CABG. No chest pain or shortness of breath. Continue Plavix 10.Tobacco abuse. Counseling 11.Hyperlipidemia. Lipitor 12.PUD. Pepcid  daily  -large bm last night with intervention LOS (Days) 1 A FACE TO FACE EVALUATION WAS PERFORMED  Meredith Staggers, MD 12/08/2016 8:46 AM

## 2016-12-08 NOTE — Progress Notes (Signed)
Social Work Assessment and Plan Social Work Assessment and Plan  Patient Details  Name: Brandon Martin MRN: [id number] Date of Birth: 11/13/1965  Today's Date: 12/08/2016  Problem List:  Patient Active Problem List   Diagnosis Date Noted  . Right basal ganglia embolic stroke (HCC) 12/07/2016  . Stroke (cerebrum) (HCC) 12/04/2016  . Tobacco use disorder   . Coronary artery disease   . GERD (gastroesophageal reflux disease)   . Hyperlipidemia   . Pleural effusion    Past Medical History:  Past Medical History:  Diagnosis Date  . Coronary artery disease   . GERD (gastroesophageal reflux disease)   . Hyperlipidemia   . Hypertension   . Pleural effusion   . Tobacco use disorder    Past Surgical History:  Past Surgical History:  Procedure Laterality Date  . CORONARY ARTERY BYPASS GRAFT  x 3   Social History:  reports that he has been smoking Cigarettes.  He has quit using smokeless tobacco. He reports that he does not drink alcohol or use drugs.  Family / Support Systems Marital Status: Single Patient Roles: Parent, Other (Comment) (sibling) Other Supports: Kathie Rhodes Bolen-MOm [phone]  Shane Bolen-brother [phone] Anticipated Caregiver: Vincenza Hews and Mom Ability/Limitations of Caregiver: Multiple family members in the home, someone is always there at home Caregiver Availability: 24/7 Family Dynamics: Close knit family who are there for one another, whatever pt needs he will have. Between all of them someone will be there with him. He is grateful for his family but would do the same for them if they needed it.  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    Abuse/Neglect Physical Abuse: Denies Verbal Abuse: Denies Sexual Abuse: Denies Exploitation of patient/patient's resources: Denies Self-Neglect: Denies  Emotional Status Pt's affect, behavior adn adjustment status: Pt is motivated to do well and get back to doing his own thing. He is not one who asks for help and will not start this either. He is planning on being independent by the time he leaves here.  Recent Psychosocial Issues: has RTC issues and knee issues, which may cause him not ot be stable with his walking here. Pyschiatric History: No history able to verbalize his concerns and feels he is doing well and looking forward to doing therapies to get even better. Substance Abuse History: Tobacco aware needs to quit and plans too. Aware of the resources available to him.  Patient / Family Perceptions, Expectations & Goals Pt/Family understanding of illness & functional limitations: Pt and his brother can explain his stroke and deficits as a result of it. He feels his MD has answered his questions and addressed his concerns. he is not afraid to ask and wants to hear the truth even if it isn't good. Premorbid pt/family roles/activities: Brother, son, retiree, cousin, etc Anticipated changes in roles/activities/participation: resume Pt/family expectations/goals: Pt states: " I want to be able to do for myself before I leave here."  Brother states: " He is hard headed he will do it anyway when he goes home."  [employer]: None Premorbid Home Care/DME Agencies: None Transportation available at discharge: Family members Resource referrals recommended: Support group (specify)  Discharge Planning Living Arrangements: Parent, Other relatives Support Systems: Parent, Other relatives, Friends/neighbors, Psychologist, clinical community Type of Residence: Private residence  Insurance Resources: Media planner (specify) (UHC Medicare) Financial Resources: [employer]  Screen Referred: Previously completed Living Expenses: Lives with family Money Management: Patient Does the patient have any problems obtaining your medications?: Yes (Describe) (Reporst co-pays too high but has health insurance) Home Management: Family members Patient/Family Preliminary Plans: Return home with family but wants to be mod/i before going home, so others do not have to help him. He is encouraged by the progress he has made already in therapies this am. Will await therapy team's evaluations and work on discharge plans. Social Work Anticipated Follow Up Needs: HH/OP, Support Group  Clinical Impression Pleasant country gentleman who is one who has always been independent and plans to be when he leaves here. He has good family support and someone will be with him at discharge. Will await team's evaluations and work on a safe Discharge plan. High level so should do well. Unsure if can assist with medication co-pays will ask PA if can be on generic meds.  Lucy Chris 12/08/2016, 1:57 PM

## 2016-12-08 NOTE — Patient Care Conference (Signed)
Inpatient RehabilitationTeam Conference and Plan of Care Update Date: 12/08/2016   Time: 10:30 AM    Patient Name: Brandon Martin      Medical Record Number: [id number]  Date of Birth: 06-16-1966 Sex: Male         Room/Bed: 4M03C/4M03C-01 Payor Info: Payor: Marine scientist / Plan: UHC MEDICARE / Product Type: *No Product type* /    Admitting Diagnosis: R CVA  Admit Date/Time:  12/07/2016  5:20 PM Admission Comments: No comment available   Primary Diagnosis:  <principal problem not specified> Principal Problem: <principal problem not specified>  Patient Active Problem List   Diagnosis Date Noted  . Right basal ganglia embolic stroke ([facility]) 42/59/5638  . Stroke (cerebrum) ([facility]) 12/04/2016  . Tobacco use disorder   . Coronary artery disease   . GERD (gastroesophageal reflux disease)   . Hyperlipidemia   . Pleural effusion     Expected Discharge Date:    Team Members Present: Physician leading conference: Dr. Delice Lesch Social Worker Present: Ovidio Kin, LCSW Nurse Present: Elliot Cousin, RN PT Present: Barrie Folk, PT;Rosita Dechalus, PTA OT Present: Napoleon Form, OT SLP Present: Stormy Fabian, SLP PPS Coordinator present : Daiva Nakayama, RN, CRRN     Current Status/Progress Goal Weekly Team Focus  Medical   admitted for right mca infarct, +HTN, left knee OA  improve functional mobility  BP control, stroke risk factor ed, pain mgt   Bowel/Bladder   continent of b/b; LBM 5/30  maintain continence with min assist  monitor for changes in continence q shift and prn   Swallow/Nutrition/ Hydration             ADL's     eval pending        Mobility     eval pending        Communication             Safety/Cognition/ Behavioral Observations            Pain   c/o pain to L shoulder radiating to neck; tramadol 50mg  q6 prn  <4  assess for pain q shift and prn   Skin   L knee abrasion with pink foam  skin free from infection and breakdown  assess skin q shift  and prn      *See Care Plan and progress notes for long and short-term goals.  Barriers to Discharge: none identified at this time    Possible Resolutions to Barriers:  n/a    Discharge Planning/Teaching Needs:    Home with brothers and Mom, someone is always there at home. Pt is disabled since 1999.      Team Discussion:  Goals mod/i level. Being evaluated-left knee buckling. MD working on adjusting medicines  Revisions to Treatment Plan:  New eval   Continued Need for Acute Rehabilitation Level of Care: The patient requires daily medical management by a physician with specialized training in physical medicine and rehabilitation for the following conditions: Daily direction of a multidisciplinary physical rehabilitation program to ensure safe treatment while eliciting the highest outcome that is of practical value to the patient.: Yes Daily medical management of patient stability for increased activity during participation in an intensive rehabilitation regime.: Yes Daily analysis of laboratory values and/or radiology reports with any subsequent need for medication adjustment of medical intervention for : Neurological problems;Other  Neill Jurewicz, Gardiner Rhyme 12/08/2016, 1:41 PM

## 2016-12-08 NOTE — Evaluation (Addendum)
Occupational Therapy Assessment and Plan  Patient Details  Name: Brandon Martin MRN: [id number] Date of Birth: 1965-10-01  OT Diagnosis: abnormal posture, acute pain, cognitive deficits, hemiplegia affecting non-dominant side and muscle weakness (generalized) Rehab Potential: Rehab Potential (ACUTE ONLY): Excellent ELOS: 8-12 days   Today's Date: 12/08/2016 OT Individual Time: 0800-0900 and 1330-1400 OT Individual Time Calculation (min): 60 min   And 30 min  Problem List:  Patient Active Problem List   Diagnosis Date Noted  . Right basal ganglia embolic stroke (HCC) 12/07/2016  . Stroke (cerebrum) (HCC) 12/04/2016  . Tobacco use disorder   . Coronary artery disease   . GERD (gastroesophageal reflux disease)   . Hyperlipidemia   . Pleural effusion     Past Medical History:  Past Medical History:  Diagnosis Date  . Coronary artery disease   . GERD (gastroesophageal reflux disease)   . Hyperlipidemia   . Hypertension   . Pleural effusion   . Tobacco use disorder    Past Surgical History:  Past Surgical History:  Procedure Laterality Date  . CORONARY ARTERY BYPASS GRAFT  x 3    Assessment & Plan Clinical Impression: Brandon Martin a 51 y.o.malewith history of CAD s/p CABG, [name], gastric ulcers, tobacco use.Per chart review patient lives with brother independent prior to admission. One level home. Assistance can be provided as needed. Admitted on 12/04/16 with LUE weakness progressing to LLE weakness, left facial droop and slurred speech as day progressed. CT MRI showed acute right MCA lenticulostriate territory infarct, nonhemorrhagic affecting the lentiform nucleusand adjacent white matter. Chronic right basal ganglia lacunar infarct. MRA negative. Patient did not receive TPA. Echocardiogram with ejection fraction of 60% grade 1 diastolic dysfunction. Carotid Doppler no ICA stenosis. Neurology consulted placed on Plavix for CVA prophylaxis. Tolerating a  mechanical soft diet. Physical occupational therapy evaluations completed with recommendations of physical medicine rehabilitation consult. Patient transferred to CIR on 12/07/2016 .    Patient currently requires mod with basic self-care skills secondary to muscle weakness, decreased cardiorespiratoy endurance, abnormal tone, unbalanced muscle activation, ataxia and decreased coordination, decreased awareness, decreased problem solving and decreased safety awareness and decreased standing balance, decreased postural control, hemiplegia and decreased balance strategies.  Prior to hospitalization, patient could complete ADLs/IADLs with independent .  Patient will benefit from skilled intervention to decrease level of assist with basic self-care skills, increase independence with basic self-care skills and increase level of independence with iADL prior to discharge home with care partner.  Anticipate patient will require intermittent supervision and follow up home health.  OT - End of Session Activity Tolerance: Tolerates 10 - 20 min activity with multiple rests Endurance Deficit: Yes Endurance Deficit Description: Rest breaks required throughout bathing/dressing tasks OT Assessment Rehab Potential (ACUTE ONLY): Excellent OT Patient demonstrates impairments in the following area(s): Balance;Cognition;Endurance;Safety;Motor;Sensory OT Basic ADL's Functional Problem(s): Grooming;Bathing;Eating;Dressing;Toileting OT Advanced ADL's Functional Problem(s): Simple Meal Preparation OT Transfers Functional Problem(s): Toilet;Tub/Shower OT Additional Impairment(s): Fuctional Use of Upper Extremity OT Plan OT Intensity: Minimum of 1-2 x/day, 45 to 90 minutes OT Frequency: 5 out of 7 days OT Duration/Estimated Length of Stay: 8-12 days OT Treatment/Interventions: Balance/vestibular training;Cognitive remediation/compensation;Discharge planning;Community reintegration;Disease mangement/prevention;DME/adaptive  equipment instruction;Functional mobility training;Functional electrical stimulation;Neuromuscular re-education;Pain management;Psychosocial support;Patient/family education;Self Care/advanced ADL retraining;Splinting/orthotics;Therapeutic Activities;UE/LE Strength taining/ROM;Therapeutic Exercise;UE/LE Coordination activities OT Self Feeding Anticipated Outcome(s): Mod I OT Basic Self-Care Anticipated Outcome(s): Mod I OT Toileting Anticipated Outcome(s): Mod I OT Bathroom Transfers Anticipated Outcome(s): Supervision- Mod I OT Recommendation Recommendations  for Other Services: Neuropsych consult;Therapeutic Recreation consult Therapeutic Recreation Interventions: Pet therapy;Kitchen group;Outing/community reintergration Patient destination: Home Follow Up Recommendations: Home health OT Equipment Recommended: To be determined   Skilled Therapeutic Intervention Session One: Pt seen for OT Eval and ADL bathing/dressing session. Pt received sitting on toilet with hand off from NT. Pt appropriately used call bell for assist. He ambulated with min-mod HHA, VCs not to reach out for furniture for support. He bathed seated on tub bench, standing with steadying assist to complete buttock hygiene. He dressed seated in recliner, educated regarding hemi dressing technique and with increased time and min A pt able to don clothing.  Trialed ambulation throughout unit using RW. Pt with difficulty maintaining L UE on RW. Pt given RW splint, able to strap hand on RW and ambulate with min A. Pt returned to room at end of session, left seated in recliner with all needs in reach. Educated regarding role of OT, POC, IPR, OT goals, need for assist/ use of call bell and d/c planning.   Session Two: Pt seen for OT session focusing on L neuro re-ed. Pt sitting up in recliner upon arrival, agreeable to tx session. Transitioned to EOB with CGA using RW with VCs for RW management. Seated EOB, completed weightbearing  activities through lateral leans onto L forearm and pt bringing self to upright position, completed with min A to prevent compensatory stratigies. Completed gross grasping task with pt attempting to grasp and release coke bottle. With increased time and min A, pt able to functionally grasp bottle and with significantly increased time able to release. Practiced towel pushes from table top level focusing on elbow flexion/extension. Pt with ~3/4 ROM elbow flexion/extension in gravity eliminated position. Pt desired to go for walk at end of session. Ambulated to nurses station and back to room with CGA, min A for RW management and balance when turning with VCs to stay inside RW. Pt returned to supine to rest at end of session, left with all needs in reach and bed alarm on.   OT Evaluation Precautions/Restrictions  Precautions Precautions: Fall Precaution Comments: mild impulsivity Restrictions Weight Bearing Restrictions: No General Chart Reviewed: Yes Pain Pain Assessment Pain Assessment: No/denies pain Pain Score: 0-No pain Home Living/Prior Functioning Home Living Family/patient expects to be discharged to:: Private residence Living Arrangements: Parent, Other relatives Available Help at Discharge: Family, Available 24 hours/day Type of Home: House Home Access: Stairs to enter [employer] of Steps: 2 steps down Entrance Stairs-Rails: None Home Layout: One level Bathroom Shower/Tub: Associate Professor: Yes  Lives With: Family (Mother and brother) IADL History Homemaking Responsibilities: Yes Current License: No Occupation: On disability Prior Function Level of Independence: Independent with basic ADLs  Able to Take Stairs?: Yes Driving: No Vocation: On disability Vision Baseline Vision/History: Wears glasses Wears Glasses: Reading only Patient Visual Report: No change from baseline Vision Assessment?: No apparent  visual deficits Additional Comments: undershoots with saccades.  Perception  Perception: Within Functional Limits Praxis Praxis: Intact Cognition Overall Cognitive Status: Impaired/Different from baseline Arousal/Alertness: Awake/alert Orientation Level: Person;Place;Situation Person: Oriented Place: Oriented Situation: Oriented Year: 2018 Month: May Day of Week: Correct Memory: Appears intact Immediate Memory Recall: Sock;Blue;Bed Memory Recall: Sock;Blue;Bed Memory Recall Sock: Without Cue Memory Recall Blue: Without Cue Memory Recall Bed: Without Cue Attention: Sustained Sustained Attention: Appears intact Awareness: Appears intact Awareness Impairment: Anticipatory impairment Problem Solving: Appears intact Behaviors: Impulsive Safety/Judgment: Impaired Comments: impulsive Sensation Sensation Light Touch: Appears Intact Proprioception: Appears  Intact Coordination Gross Motor Movements are Fluid and Coordinated: No Fine Motor Movements are Fluid and Coordinated: No Coordination and Movement Description: dysmertic LLE and flaccid LUE Finger Nose Finger Test: WFL R UE; flaccid L UE Heel Shin Test: dysmetria secondary to muscle timing impairment.  Motor  Motor Motor: Hemiplegia Motor - Skilled Clinical Observations: L hemiplegia UE>LE Mobility  Bed Mobility Bed Mobility: Rolling Right;Rolling Left;Supine to Sit;Sit to Supine Rolling Right: 5: Supervision Rolling Right Details: Verbal cues for precautions/safety;Verbal cues for technique Rolling Left: 5: Supervision Rolling Left Details: Verbal cues for precautions/safety;Verbal cues for technique Supine to Sit: 5: Supervision Supine to Sit Details: Verbal cues for technique;Verbal cues for precautions/safety;Verbal cues for sequencing Sit to Supine: 5: Supervision Sit to Supine - Details: Verbal cues for technique;Verbal cues for precautions/safety;Verbal cues for sequencing Transfers Sit to Stand: 4: Min  assist (no AD ) Sit to Stand Details: Verbal cues for technique;Verbal cues for sequencing;Verbal cues for precautions/safety  Trunk/Postural Assessment  Cervical Assessment Cervical Assessment: Exceptions to [facility] (Forward head) Thoracic Assessment Thoracic Assessment: Exceptions to [facility] (Kyphotic) Lumbar Assessment Lumbar Assessment: Exceptions to [facility] (posterior pelvic tilt) Postural Control Postural Control: Deficits on evaluation (Delayed in standing)  Balance Balance Balance Assessed: Yes Static Sitting Balance Static Sitting - Level of Assistance: 6: Modified independent (Device/Increase time) Dynamic Sitting Balance Dynamic Sitting - Balance Support: No upper extremity supported;During functional activity;Feet supported Dynamic Sitting - Level of Assistance: 5: Stand by assistance Sitting balance - Comments: Sitting to shower and dress Static Standing Balance Static Standing - Balance Support: During functional activity;Right upper extremity supported;Left upper extremity supported Static Standing - Level of Assistance: 4: Min assist Dynamic Standing Balance Dynamic Standing - Balance Support: Right upper extremity supported;Left upper extremity supported;During functional activity Dynamic Standing - Level of Assistance: 3: Mod assist Dynamic Standing - Balance Activities: Reaching for objects Dynamic Standing - Comments: Standing to complete LB bathing/dressing and toileting Extremity/Trunk Assessment RUE Assessment RUE Assessment: Within Functional Limits LUE Assessment LUE Assessment: Exceptions to WFL LUE AROM (degrees) LUE Overall AROM Comments: Shoulder hiking to compensate for shoulder flexion LUE PROM (degrees) LUE Overall PROM Comments: ~45 degrees PROM, pt with hx of rotator cuff injury, unable to complete full ROM PTA   See Function Navigator for Current Functional Status.   Refer to Care Plan for Long Term Goals  Recommendations for other services:  Neuropsych and Therapeutic Recreation  Pet therapy, Kitchen group, Stress management and Outing/community reintegration   Discharge Criteria: Patient will be discharged from OT if patient refuses treatment 3 consecutive times without medical reason, if treatment goals not met, if there is a change in medical status, if patient makes no progress towards goals or if patient is discharged from hospital.  The above assessment, treatment plan, treatment alternatives and goals were discussed and mutually agreed upon: by patient  Melvyn Neth, Claretta Kendra C 12/08/2016, 12:21 PM

## 2016-12-08 NOTE — Evaluation (Signed)
Physical Therapy Assessment and Plan  Patient Details  Name: Brandon Martin MRN: [id number] Date of Birth: 21-Jan-1966  PT Diagnosis: Abnormality of gait, Coordination disorder and Hemiplegia non-dominant Rehab Potential: Excellent ELOS: 8-12 days   Today's Date: 12/08/2016 PT Individual Time: 0930-1030 PT Individual Time Calculation (min): 60 min    Problem List:  Patient Active Problem List   Diagnosis Date Noted  . Right basal ganglia embolic stroke (HCC) 12/07/2016  . Stroke (cerebrum) (HCC) 12/04/2016  . Tobacco use disorder   . Coronary artery disease   . GERD (gastroesophageal reflux disease)   . Hyperlipidemia   . Pleural effusion     Past Medical History:  Past Medical History:  Diagnosis Date  . Coronary artery disease   . GERD (gastroesophageal reflux disease)   . Hyperlipidemia   . Hypertension   . Pleural effusion   . Tobacco use disorder    Past Surgical History:  Past Surgical History:  Procedure Laterality Date  . CORONARY ARTERY BYPASS GRAFT  x 3    Assessment & Plan Clinical Impression: Patient is a 51 y.o.malewith history of CAD s/p CABG, [name], gastric ulcers, tobacco use.Per chart review patient lives with brother independent prior to admission. One level home. Assistance can be provided as needed. Admitted on 12/04/16 with LUE weakness progressing to LLE weakness, left facial droop and slurred speech as day progressed. CT MRI showed acute right MCA lenticulostriate territory infarct, nonhemorrhagic affecting the lentiform nucleusand adjacent white matter. Chronic right basal ganglia lacunar infarct. MRA negative. Patient did not receive TPA. Echocardiogram with ejection fraction of 60% grade 1 diastolic dysfunction. Carotid Doppler no ICA stenosis. Neurology consulted placed on Plavix for CVA prophylaxis.  Patient transferred to CIR on 12/07/2016 .   Patient currently requires mod with mobility secondary to muscle weakness, decreased  oxygen support, impaired timing and sequencing, abnormal tone, unbalanced muscle activation and decreased coordination, decreased awareness and decreased safety awareness and decreased standing balance, decreased postural control, hemiplegia and decreased balance strategies.  Prior to hospitalization, patient was modified independent  with mobility and lived with   in a House home.  Home access is 2 steps downStairs to enter.  Patient will benefit from skilled PT intervention to maximize safe functional mobility, minimize fall risk and decrease caregiver burden for planned discharge home with intermittent assist.  Anticipate patient will benefit from follow up [facility] at discharge.  PT - End of Session Activity Tolerance: Tolerates 30+ min activity with multiple rests Endurance Deficit: Yes PT Assessment Rehab Potential (ACUTE/IP ONLY): Excellent Barriers to Discharge: Inaccessible home environment;Decreased caregiver support PT Patient demonstrates impairments in the following area(s): Behavior;Motor;Endurance;Perception;Sensory;Safety;Balance PT Transfers Functional Problem(s): Bed Mobility;Bed to Chair;Car;Furniture;Floor PT Locomotion Functional Problem(s): Ambulation PT Plan PT Intensity: Minimum of 1-2 x/day ,45 to 90 minutes PT Frequency: 5 out of 7 days PT Duration Estimated Length of Stay: 8-12 days PT Treatment/Interventions: Ambulation/gait training;Balance/vestibular training;Cognitive remediation/compensation;Community reintegration;Discharge planning;Disease management/prevention;DME/adaptive equipment instruction;Functional electrical stimulation;Functional mobility training;Neuromuscular re-education;Pain management;Patient/family education;Psychosocial support;Splinting/orthotics;Stair training;Therapeutic Activities;Therapeutic Exercise;UE/LE Strength taining/ROM;UE/LE Coordination activities;Visual/perceptual remediation/compensation;Wheelchair propulsion/positioning PT Transfers  Anticipated Outcome(s): Mod I with LRAD  PT Locomotion Anticipated Outcome(s): Mod I with LRAD  PT Recommendation Follow Up Recommendations: Home health PT Patient destination: Home Equipment Recommended: Rolling walker with 5" wheels Equipment Details: has can   Skilled Therapeutic Intervention PT instructed patient in PT Evaluation and initiated treatment intervention; see below for results. PT educated patient in POC, rehab potential, rehab goals, and discharge recommendations. Pt required  min-mod assist for stand pivot transfers as well as gait without AD due to intermittent L knee instability. PT progressed pt Ambulation with RW and required min assist overall with cues for gait pattern and use of LUE hand splint x177ft Patient returned too room and left sitting in recliner with call bell in reach and all needs met.      PT Evaluation Precautions/Restrictions Precautions Precautions: Fall Precaution Comments: mild impulsivity Restrictions Weight Bearing Restrictions: No General   Vital Signs Pain Pain Assessment Pain Assessment: No/denies pain Pain Score: 0-No pain Home Living/Prior Functioning Home Living Available Help at Discharge: Family;Available 24 hours/day Type of Home: House Home Access: Stairs to enter [employer] of Steps: 2 steps down Entrance Stairs-Rails: None Home Layout: One level Bathroom Shower/Tub: Engineer, manufacturing systems: Standard Bathroom Accessibility: Yes Prior Function Level of Independence: Independent with basic ADLs  Able to Take Stairs?: Yes Driving: No Vocation: On disability Vision/Perception  Vision - Assessment Additional Comments: undershoots with saccades.  Perception Perception: Within Functional Limits Praxis Praxis: Intact  Cognition Overall Cognitive Status: Impaired/Different from baseline Orientation Level: Oriented X4 Sustained Attention: Appears intact Memory: Appears intact Awareness: Appears  intact Problem Solving: Appears intact Safety/Judgment: Impaired Comments: impulsive Sensation Sensation Light Touch: Appears Intact Proprioception: Appears Intact Coordination Gross Motor Movements are Fluid and Coordinated: No Fine Motor Movements are Fluid and Coordinated: No Coordination and Movement Description: dysmertic LLE and flaccid LUE Heel Shin Test: dysmetria secondary to muscle timing impairment.  Motor  Motor Motor: Hemiplegia Motor - Skilled Clinical Observations: L hemiplegia UE>LE  Mobility Bed Mobility Bed Mobility: Rolling Right;Rolling Left;Supine to Sit;Sit to Supine Rolling Right: 5: Supervision Rolling Right Details: Verbal cues for precautions/safety;Verbal cues for technique Rolling Left: 5: Supervision Rolling Left Details: Verbal cues for precautions/safety;Verbal cues for technique Supine to Sit: 5: Supervision Supine to Sit Details: Verbal cues for technique;Verbal cues for precautions/safety;Verbal cues for sequencing Sit to Supine: 5: Supervision Sit to Supine - Details: Verbal cues for technique;Verbal cues for precautions/safety;Verbal cues for sequencing Transfers Transfers: Yes Sit to Stand: 4: Min assist (no AD ) Sit to Stand Details: Verbal cues for technique;Verbal cues for sequencing;Verbal cues for precautions/safety Stand Pivot Transfers: 4: Min assist (no AD ) Stand Pivot Transfer Details: Verbal cues for technique;Verbal cues for sequencing;Verbal cues for precautions/safety Stand Pivot Transfer Details (indicate cue type and reason): Decreased WB through LLE as  well as decreased anterior weight shift.  Locomotion  Ambulation Ambulation: Yes Ambulation/Gait Assistance: 4: Min assist;3: Mod assist Ambulation Distance (Feet): 140 Feet Ambulation/Gait Assistance Details: Verbal cues for gait pattern;Verbal cues for precautions/safety;Verbal cues for technique;Verbal cues for sequencing Gait Gait: Yes Gait Pattern: Impaired Gait  Pattern: Wide base of support;Left foot flat;Decreased stance time - left;Left circumduction Stairs / Additional Locomotion Stairs: Yes Stairs Assistance: 4: Min assist Stairs Assistance Details: Verbal cues for gait pattern;Verbal cues for precautions/safety;Verbal cues for technique;Tactile cues for posture Stair Management Technique: Two rails;One rail Right Number of Stairs: 12 Height of Stairs: 6 ( and 3) Wheelchair Mobility Wheelchair Mobility: Yes Wheelchair Assistance: 4: Administrator, sports Details: Verbal cues for precautions/safety;Verbal cues for technique;Manual facilitation for Water quality scientist: Right upper extremity;Right lower extremity Wheelchair Parts Management: Needs assistance Distance: 167ft   Trunk/Postural Assessment  Cervical Assessment Cervical Assessment: Exceptions to [employer] Thoracic Assessment Thoracic Assessment: Exceptions to [facility] Lumbar Assessment Lumbar Assessment: Exceptions to [facility] Postural Control Postural Control: Deficits on evaluation (delayed in standing)  Balance Balance Balance Assessed: Yes Static Sitting  Balance Static Sitting - Level of Assistance: 6: Modified independent (Device/Increase time) Dynamic Sitting Balance Dynamic Sitting - Balance Support: No upper extremity supported Dynamic Sitting - Level of Assistance: 5: Stand by assistance Static Standing Balance Static Standing - Balance Support: No upper extremity supported Static Standing - Level of Assistance: 4: Min assist Dynamic Standing Balance Dynamic Standing - Balance Support: No upper extremity supported Dynamic Standing - Level of Assistance: 3: Mod assist Dynamic Standing - Balance Activities: Reaching for objects Extremity Assessment      RLE Assessment RLE Assessment: Within Functional Limits LLE Assessment LLE Assessment: Exceptions to WFL LLE AROM (degrees) LLE Overall AROM Comments: decreased hip flexion.  LLE Strength LLE Overall  Strength Comments: grossly 4-/5 hip flexion and ankle DF. 4+/5 knee flexion and extension.  delayed initation in all muscle groups.    See Function Navigator for Current Functional Status.   Refer to Care Plan for Long Term Goals  Recommendations for other services: Therapeutic Recreation  Pet therapy and Kitchen group  Discharge Criteria: Patient will be discharged from PT if patient refuses treatment 3 consecutive times without medical reason, if treatment goals not met, if there is a change in medical status, if patient makes no progress towards goals or if patient is discharged from hospital.  The above assessment, treatment plan, treatment alternatives and goals were discussed and mutually agreed upon: by patient  Golden Pop 12/08/2016, 12:07 PM

## 2016-12-08 NOTE — Care Management Note (Signed)
Inpatient Rehabilitation Center Individual Statement of Services  Patient Name:  Brandon Martin  Date:  12/08/2016  Welcome to the [facility].  Our goal is to provide you with an individualized program based on your diagnosis and situation, designed to meet your specific needs.  With this comprehensive rehabilitation program, you will be expected to participate in at least 3 hours of rehabilitation therapies Monday-Friday, with modified therapy programming on the weekends.  Your rehabilitation program will include the following services:  Physical Therapy (PT), Occupational Therapy (OT), Speech Therapy (ST), 24 hour per day rehabilitation nursing, Therapeutic Recreaction (TR), Case Management (Social Worker), Rehabilitation Medicine, Nutrition Services and Pharmacy Services  Weekly team conferences will be held on Wednesday to discuss your progress.  Your Social Worker will talk with you frequently to get your input and to update you on team discussions.  Team conferences with you and your family in attendance may also be held.  Expected length of stay: 7-10 days Overall anticipated outcome: mod/i level  Depending on your progress and recovery, your program may change. Your Social Worker will coordinate services and will keep you informed of any changes. Your Social Worker's name and contact numbers are listed  below.  The following services may also be recommended but are not provided by the [facility]:    [place] will be made to provide these services after discharge if needed.  Arrangements include referral to agencies that provide these services.  Your insurance has been verified to be:  [facility] Medicare Your primary doctor is:  Orpah Melter  Pertinent information will be shared with your doctor and your insurance company.  Social Worker:  Ovidio Kin, [place] or ([phone]  Information discussed with and copy given to patient by: Elease Hashimoto, 12/08/2016, 1:45 PM

## 2016-12-09 ENCOUNTER — Inpatient Hospital Stay (HOSPITAL_COMMUNITY): Payer: Medicare Other | Admitting: Occupational Therapy

## 2016-12-09 ENCOUNTER — Inpatient Hospital Stay (HOSPITAL_COMMUNITY): Payer: Medicare Other | Admitting: Physical Therapy

## 2016-12-09 DIAGNOSIS — G8194 Hemiplegia, unspecified affecting left nondominant side: Secondary | ICD-10-CM

## 2016-12-09 NOTE — Progress Notes (Signed)
[facility] PHYSICAL MEDICINE & REHABILITATION     PROGRESS NOTE    Subjective/Complaints: No new complaints. Left knee still a little sore. Doing well in therapy. Navigated steps yesterday  ROS: pt denies nausea, vomiting, diarrhea, cough, shortness of breath or chest pain   Objective: Vital Signs: Blood pressure 138/86, pulse 75, temperature 98.1 F (36.7 C), temperature source Oral, resp. rate 18, height 5\' 8"  (1.727 m), weight 90.6 kg (199 lb 11.8 oz), SpO2 99 %. No results found.  Recent Labs  12/08/16 0453  WBC 10.1  HGB 15.5  HCT 46.8  PLT 221    Recent Labs  12/08/16 0453  NA 136  K 4.2  CL 100*  GLUCOSE 135*  BUN 17  CREATININE 1.09  CALCIUM 9.3   CBG (last 3)  No results for input(s): GLUCAP in the last 72 hours.  Wt Readings from Last 3 Encounters:  12/08/16 90.6 kg (199 lb 11.8 oz)  12/05/16 92.6 kg (204 lb 3.2 oz)  11/17/12 93.3 kg (205 lb 9.6 oz)    Physical Exam:  HENT:  Dentition fair Eyes: PERRL Neck: Normal range of motion. Neck supple. No thyromegalypresent.  Cardiovascular: RRR Respiratory: CTA B.  GI: Soft. Bowel sounds are normal. He exhibits no distension.  Skin. Warm and dry Musculoskeletal: small effusion around left knee. Some tenderness with PROM/AROM Left shoulder pain/weakness due to old RTC injury Neurological: He is alertand oriented to person, place, and time. Coordinationabnormal.  Alert. Left central 7 present. Speech remains slightly dysarthric. LUE: 2/5 deltoid, 3+/5 biceps, triceps, and 3/5 wrist/hand. LLE:  4/5 HF, KE and ADF/PF. No sensory deficits--motor and sensory exam unchanged Psych: pleasant  Assessment/Plan: 1. Functional and mobility deficits secondary to right MCA infarct which require 3+ hours per day of interdisciplinary therapy in a comprehensive inpatient rehab setting. Physiatrist is providing close team supervision and 24 hour management of active medical problems listed below. Physiatrist and  rehab team continue to assess barriers to discharge/monitor patient progress toward functional and medical goals.  Function:  Bathing Bathing position   Position: Shower  Bathing parts Body parts bathed by patient: Left arm, Right upper leg, Left upper leg, Chest, Abdomen, Right lower leg, Front perineal area, Left lower leg, Buttocks Body parts bathed by helper: Right arm, Back  Bathing assist Assist Level: Touching or steadying assistance(Pt > 75%)      Upper Body Dressing/Undressing Upper body dressing   What is the patient wearing?: Pull over shirt/dress     Pull over shirt/dress - Perfomed by patient: Thread/unthread right sleeve, Thread/unthread left sleeve, Put head through opening, Pull shirt over trunk          Upper body assist Assist Level: Touching or steadying assistance(Pt > 75%)      Lower Body Dressing/Undressing Lower body dressing   What is the patient wearing?: Underwear, Pants, [employer], Shoes Underwear - Performed by patient: Thread/unthread right underwear leg, Thread/unthread left underwear leg, Pull underwear up/down   Pants- Performed by patient: Thread/unthread right pants leg, Thread/unthread left pants leg, Pull pants up/down           Shoes - Performed by patient: Don/doff right shoe, Don/doff left shoe         TED Hose - Performed by helper: Don/doff right TED hose, Don/doff left TED hose  Lower body assist Assist for lower body dressing: Touching or steadying assistance (Pt > 75%)      Toileting Toileting   Toileting steps completed by patient: Adjust  clothing prior to toileting, Performs perineal hygiene, Adjust clothing after toileting      Toileting assist Assist level: Touching or steadying assistance (Pt.75%)   Transfers Chair/bed transfer   Chair/bed transfer method: Stand pivot Chair/bed transfer assist level: Moderate assist (Pt 50 - 74%/lift or lower)       Locomotion Ambulation     Max distance: 168ft  Assist  level: Moderate assist (Pt 50 - 74%)   Wheelchair   Type: Manual Max wheelchair distance: 141ft  Assist Level: Touching or steadying assistance (Pt > 75%)  Cognition Comprehension Comprehension assist level: Understands complex 90% of the time/cues 10% of the time  Expression Expression assist level: Expresses complex ideas: With extra time/assistive device  Social Interaction Social Interaction assist level: Interacts appropriately with others with medication or extra time (anti-anxiety, antidepressant).  Problem Solving Problem solving assist level: Solves complex problems: With extra time  Memory Memory assist level: More than reasonable amount of time   Medical Problem List and Plan: 1. Left hemiparesis with facial droop and slurred speechsecondary to right MCA/lenticulostriate region infarct -continue PT,OT, SLP 2. DVT Prophylaxis/Anticoagulation: Subcutaneous Lovenox. Monitor platelet counts and any signs of bleeding 3. Pain Management/OA left knee:    -voltaren,ice to left knee  -consider bracing if needed for support  -tramadol prn 4. Mood: Provide emotional support 5. Neuropsych: This patient iscapable of making decisions on hisown behalf. 6. Skin/Wound Care: Routine skin checks 7. Fluids/Electrolytes/Nutrition: good po intake    8.Hypertension. Norvasc increased to 10mg  daily for better control.  9.History of CAD with CABG. No chest pain or shortness of breath. Continue Plavix 10.Tobacco abuse. Counseling 11.Hyperlipidemia. Lipitor 12.PUD/GI. Pepcid daily  -BM on admission   LOS (Days) 2 A Tarrant T, MD 12/09/2016 9:01 AM

## 2016-12-09 NOTE — Progress Notes (Signed)
Patient information reviewed and entered into eRehab system by Tinesha Siegrist, RN, CRRN, PPS Coordinator.  Information including medical coding and functional independence measure will be reviewed and updated through discharge.     Per nursing patient was given "Data Collection Information Summary for Patients in Inpatient Rehabilitation Facilities with attached "Privacy Act Statement-Health Care Records" upon admission.  

## 2016-12-09 NOTE — Progress Notes (Signed)
Occupational Therapy Session Note  Patient Details  Name: AMOUR CUTRONE MRN: [id number] Date of Birth: 12-24-1965  Today's Date: 12/09/2016 OT Individual Time: 1300-1345 OT Individual Time Calculation (min): 45 min    Short Term Goals: Week 1:  OT Short Term Goal 1 (Week 1): Pt will bathe UB with supervision/ set-up using modified techniques PRN OT Short Term Goal 2 (Week 1): Pt will dress LB with supervision OT Short Term Goal 3 (Week 1): Pt will recall hemi dressing techniques for UB and LB without need for VCs.  OT Short Term Goal 4 (Week 1): Pt will ambulate to gather clothing in prep for showering task with supervision using LRAD  Skilled Therapeutic Interventions/Progress Updates:    1:1 NMR with focus normal patterns of movement in left UE with progression of supine to sidelying to sitting. Performed modified PNF patterns with min A to assist with gravity (due to difficulty sustain contraction). Pt able to get movement in scapular protraction/ retraction, bicep extension/ flexion, wrist extension and ulnar side flexion. Also used ESTIM on left flexor mm group in forearm to focus on radial flexion of hand - with success. (No adverse reactions)  Therapy Documentation Precautions:  Precautions Precautions: Fall Precaution Comments: mild impulsivity Restrictions Weight Bearing Restrictions: No Pain:  mild discomfort in left shoulder but able to continued and allowed breaks as requested See Function Navigator for Current Functional Status.   Therapy/Group: Individual Therapy  Willeen Cass [facility] 12/09/2016, 2:31 PM

## 2016-12-09 NOTE — Progress Notes (Signed)
Occupational Therapy Session Note  Patient Details  Name: Brandon Martin MRN: [id number] Date of Birth: 10/09/1965  Today's Date: 12/09/2016  Session 1 OT Individual Time: 1003-1130 OT Individual Time Calculation (min): 87 min   Session 2 OT Individual Time: 7076-1518 OT Individual Time Calculation (min): 30 min   Short Term Goals: Week 1:  OT Short Term Goal 1 (Week 1): Pt will bathe UB with supervision/ set-up using modified techniques PRN OT Short Term Goal 2 (Week 1): Pt will dress LB with supervision OT Short Term Goal 3 (Week 1): Pt will recall hemi dressing techniques for UB and LB without need for VCs.  OT Short Term Goal 4 (Week 1): Pt will ambulate to gather clothing in prep for showering task with supervision using LRAD  Skilled Therapeutic Interventions/Progress Updates:   Session 1   OT treatment session focused on modified bathing/dressing, hemi techniques, L NMR, forced use of L UE, and standing balance. Pt ambulated to bathroom with Min guard A and RW. VC and min A to maintain balance when turning to sit down on shower bench. Incorporated NMR weight bearing techniques into bathring tasks with hand-over hand assist. Guided techniques to facilitate normal movement patterns. Educated pt on hemi-techniques with UB/LB dressing, including donning TED hose using friction reducing device. Lateral LOB to L when donning socks requiring mod A to correct. Demonstrated ways to utilize L UE as stabilizer when opening containers. Pt then ambulated to therapy gym with RW and min A,  for L NMR and e-stem. Focus on shoulde/elbow/wrist/hand flex/ext with joint input provided to bring pt through full ROM. Manual facilitation to elicit normal movement patterns. Set-up e-stem for use in next therapy session. Pt returned to room in similar method and left seated in recliner with needs met.   Session 2 OT treatment session focused on L NMR and sit<>stand. Sit<>stand from recliner focused on anterior  weight shift and balance strategies. Pt then placed L UE in RW splint and ambulated to therapy apartment with steady assist. L NMR w/ weight bearing techniques using towel push/pull in standing and sitting. Manual facilitation to facilitate full finger extension and normal movement patterns. Reviewed self-ROM and pt able to complete 3 sets x 10. Pt ambulated to therapy gym and left seated on therapy mat for handoff to PT.  Therapy Documentation Precautions:  Precautions Precautions: Fall Precaution Comments: mild impulsivity Restrictions Weight Bearing Restrictions: No Pain: Pain Assessment Pain Assessment: No/denies pain Pain Score: 0-No pain  See Function Navigator for Current Functional Status.   Therapy/Group: Individual Therapy  Valma Cava 12/09/2016, 3:15 PM

## 2016-12-09 NOTE — Plan of Care (Signed)
Problem: RH BOWEL ELIMINATION Goal: RH STG MANAGE BOWEL W/MEDICATION W/ASSISTANCE STG Manage Bowel with Medication with min Assistance.  Outcome: Progressing No BM tonight  Problem: RH SKIN INTEGRITY Goal: RH STG SKIN FREE OF INFECTION/BREAKDOWN No new breakdown with min assist   Outcome: Progressing No skin infection noted  Problem: RH SAFETY Goal: RH STG ADHERE TO SAFETY PRECAUTIONS W/ASSISTANCE/DEVICE STG Adhere to Safety Precautions With min Assistance/Device.  Outcome: Progressing Patient is aware of safety precautions, no issues noted tonight  Problem: RH PAIN MANAGEMENT Goal: RH STG PAIN MANAGED AT OR BELOW PT'S PAIN GOAL <4 out of 10.   Outcome: Progressing Denies pain this shift  Problem: RH KNOWLEDGE DEFICIT Goal: RH STG INCREASE KNOWLEDGE OF HYPERTENSION Pt will demonstrate increased knowledge of hypertension management with min assist upon discharge.   Outcome: Progressing Patient stated he will comply with hypertension medications and regimen after dischege and will not smoke

## 2016-12-09 NOTE — Progress Notes (Signed)
Physical Therapy Session Note  Patient Details  Name: Brandon Martin MRN: [id number] Date of Birth: 08-27-1965  Today's Date: 12/09/2016 PT Individual Time: 1500-1600 PT Individual Time Calculation (min): 60 min   Short Term Goals: Week 1:  PT Short Term Goal 1 (Week 1): STG= LTG due to ELOS  Skilled Therapeutic Interventions/Progress Updates:    no c/o pain except chronic pain in L knee.  Session focus on L NMR and activity tolerance.   Pt ambulates x100' with RW and min guard, 100' with no device and min guard with intermittent HHA.  Min verbal cues for upright posture and step through pattern when not suring AD.  Pt returned to room at end of session with RW, initially steady assist fade to supervision.  Min verbal cues for ambulatory approach to chair.   NMR in standing with forced LLE weight bearing and RLE toe taps to 4" step, pt completes 5 trials to fatigue with min assist fade to close supervision.  Nustep x8 minutes at level 3 with BLEs and LUE focus on forced use and reciprocal stepping pattern retraining.   Pt c/o soreness in L anterior shoulder, PT applied kinesiotape in power star arrangement for decompression and pain control with positive effect.  Pt educated on roll of kinesiotape, contraindications, and precautions.    Seated in recliner at end of session with call bell in reach and needs met.  Therapy Documentation Precautions:  Precautions Precautions: Fall Precaution Comments: mild impulsivity Restrictions Weight Bearing Restrictions: No  See Function Navigator for Current Functional Status.   Therapy/Group: Individual Therapy  [name] 12/09/2016, 3:49 PM

## 2016-12-10 ENCOUNTER — Inpatient Hospital Stay (HOSPITAL_COMMUNITY): Payer: Medicare Other | Admitting: Physical Therapy

## 2016-12-10 ENCOUNTER — Inpatient Hospital Stay (HOSPITAL_COMMUNITY): Payer: Medicare Other | Admitting: Occupational Therapy

## 2016-12-10 NOTE — IPOC Note (Signed)
Overall Plan of Care [facility]) Patient Details Name: GEO SPAGNOLO MRN: [id number] DOB: 1966/05/31  Admitting Diagnosis: R CVA  Hospital Problems: Active Problems:   Right basal ganglia embolic stroke [facility])     Functional Problem List: Nursing Sensory, Skin Integrity, Motor, Medication Management, Endurance, Nutrition, Pain, Safety  PT Behavior, Motor, Endurance, Perception, Sensory, Safety, Balance  OT Balance, Cognition, Endurance, Safety, Motor, Sensory  SLP    TR         Basic ADL's: OT Grooming, Bathing, Eating, Dressing, Toileting     Advanced  ADL's: OT Simple Meal Preparation     Transfers: PT Bed Mobility, Bed to Chair, Car, [employer], Civil Service fast streamer, Tub/Shower     Locomotion: PT Ambulation     Additional Impairments: OT Fuctional Use of Upper Extremity  SLP None      TR      Anticipated Outcomes Item Anticipated Outcome  Self Feeding Mod I  Swallowing      Basic self-care  Mod I  Toileting  Mod I   Bathroom Transfers Supervision- Mod I  Bowel/Bladder  Pt will manage bowel and bladder with mod I assist at discharge   Transfers  Mod I with LRAD   Locomotion  Mod I with LRAD   Communication     Cognition     Pain  Pt will rate pain at 4 or less on a scale of 0-10.   Safety/Judgment  Pt will remain free of falls and injury with min assist.    Therapy Plan: PT Intensity: Minimum of 1-2 x/day ,45 to 90 minutes PT Frequency: 5 out of 7 days PT Duration Estimated Length of Stay: 8-12 days OT Intensity: Minimum of 1-2 x/day, 45 to 90 minutes OT Frequency: 5 out of 7 days OT Duration/Estimated Length of Stay: 8-12 days         Team Interventions: Nursing Interventions Patient/Family Education, Pain Management, Disease Management/Prevention, Dysphagia/Aspiration Precaution Training, Medication Management, Skin Care/Wound Management, Discharge Planning  PT interventions Ambulation/gait training, Balance/vestibular training, Cognitive  remediation/compensation, Community reintegration, Discharge planning, Disease management/prevention, DME/adaptive equipment instruction, Functional electrical stimulation, Functional mobility training, Neuromuscular re-education, Pain management, Patient/family education, Psychosocial support, Splinting/orthotics, Stair training, Therapeutic Activities, Therapeutic Exercise, UE/LE Strength taining/ROM, UE/LE Coordination activities, Visual/perceptual remediation/compensation, Wheelchair propulsion/positioning  OT Interventions Warden/ranger, Cognitive remediation/compensation, Discharge planning, Community reintegration, Disease mangement/prevention, DME/adaptive equipment instruction, Functional mobility training, Functional electrical stimulation, Neuromuscular re-education, Pain management, Psychosocial support, Patient/family education, Self Care/advanced ADL retraining, Splinting/orthotics, Therapeutic Activities, UE/LE Strength taining/ROM, Therapeutic Exercise, UE/LE Coordination activities  SLP Interventions    TR Interventions    SW/CM Interventions Discharge Planning, Psychosocial Support, Patient/Family Education    Team Discharge Planning: Destination: PT-Home ,OT- Home , SLP-Home Projected Follow-up: PT-Home health PT, OT-  Home health OT, SLP-None Projected Equipment Needs: PT-Rolling walker with 5" wheels, OT- To be determined, SLP-None recommended by SLP Equipment Details: PT-has can , OT-  Patient/family involved in discharge planning: PT- Patient,  OT-Patient, SLP-   MD ELOS: 7-9d Medical Rehab Prognosis:  Excellent Assessment:  51 y.o.malewith history of CAD s/p CABG, [name], gastric ulcers, tobacco use.Per chart review patient lives with brother independent prior to admission. One level home. Assistance can be provided as needed. Admitted on 12/04/16 with LUE weakness progressing to LLE weakness, left facial droop and slurred speech as day  progressed. CT MRI showed acute right MCA lenticulostriate territory infarct, nonhemorrhagic affecting the lentiform nucleusand adjacent white matter. Chronic right basal ganglia lacunar  infarct. MRA negative. Patient did not receive TPA. Echocardiogram with ejection fraction of 60% grade 1 diastolic dysfunction. Carotid Doppler no ICA stenosis. Neurology consulted placed on Plavix for CVA prophylaxis.   Now requiring 24/7 Rehab RN,MD, as well as CIR level PT, OT and SLP.  Treatment team will focus on ADLs and mobility with goals set at Mod I See Team Conference Notes for weekly updates to the plan of care

## 2016-12-10 NOTE — Progress Notes (Signed)
[facility] PHYSICAL MEDICINE & REHABILITATION     PROGRESS NOTE    Subjective/Complaints: Starting to move L arm more, some cramping left side with movement  ROS: pt denies nausea, vomiting, diarrhea, cough, shortness of breath or chest pain   Objective: Vital Signs: Blood pressure (!) 151/95, pulse (!) 112, temperature 98.2 F (36.8 C), temperature source Oral, resp. rate 18, height 5\' 8"  (1.727 m), weight 90.6 kg (199 lb 11.8 oz), SpO2 98 %. No results found.  Recent Labs  12/08/16 0453  WBC 10.1  HGB 15.5  HCT 46.8  PLT 221    Recent Labs  12/08/16 0453  NA 136  K 4.2  CL 100*  GLUCOSE 135*  BUN 17  CREATININE 1.09  CALCIUM 9.3   CBG (last 3)  No results for input(s): GLUCAP in the last 72 hours.  Wt Readings from Last 3 Encounters:  12/08/16 90.6 kg (199 lb 11.8 oz)  12/05/16 92.6 kg (204 lb 3.2 oz)  11/17/12 93.3 kg (205 lb 9.6 oz)    Physical Exam:  HENT:  Dentition fair Eyes: PERRL Neck: Normal range of motion. Neck supple. No thyromegalypresent.  Cardiovascular: RRR Respiratory: CTA B.  GI: Soft. Bowel sounds are normal. He exhibits no distension.  Skin. Warm and dry Musculoskeletal: Crepitus left knee.no shoulder pain with AROM Neurological: He is alertand oriented to person, place, and time. Coordinationabnormal.  Alert. Left central 7 present. Speech remains slightly dysarthric. LUE: 2/5 deltoid, 3+/5 biceps, triceps, and 3/5 wrist/hand. LLE:  4/5 HF, KE and ADF/PF. No sensory deficits--motor and sensory exam unchanged Psych: pleasant  Assessment/Plan: 1. Functional and mobility deficits secondary to right MCA infarct which require 3+ hours per day of interdisciplinary therapy in a comprehensive inpatient rehab setting. Physiatrist is providing close team supervision and 24 hour management of active medical problems listed below. Physiatrist and rehab team continue to assess barriers to discharge/monitor patient progress toward functional  and medical goals.  Function:  Bathing Bathing position   Position: Shower  Bathing parts Body parts bathed by patient: Left arm, Right upper leg, Left upper leg, Chest, Abdomen, Right lower leg, Front perineal area, Left lower leg, Buttocks Body parts bathed by helper: Right arm, Back  Bathing assist Assist Level: Touching or steadying assistance(Pt > 75%)      Upper Body Dressing/Undressing Upper body dressing   What is the patient wearing?: Pull over shirt/dress     Pull over shirt/dress - Perfomed by patient: Thread/unthread right sleeve, Thread/unthread left sleeve, Put head through opening Pull over shirt/dress - Perfomed by helper: Pull shirt over trunk        Upper body assist Assist Level: Touching or steadying assistance(Pt > 75%)      Lower Body Dressing/Undressing Lower body dressing   What is the patient wearing?: Underwear, Pants, Socks, [employer] - Performed by patient: Thread/unthread right underwear leg, Thread/unthread left underwear leg Underwear - Performed by helper: Pull underwear up/down Pants- Performed by patient: Thread/unthread right pants leg, Thread/unthread left pants leg Pants- Performed by helper: Pull pants up/down     Socks - Performed by patient: Don/doff right sock, Don/doff left sock   Shoes - Performed by patient: Don/doff right shoe, Don/doff left shoe         TED Hose - Performed by helper: Don/doff right TED hose, Don/doff left TED hose  Lower body assist Assist for lower body dressing: Touching or steadying assistance (Pt > 75%)      Toileting Toileting  Toileting steps completed by patient: Adjust clothing prior to toileting, Performs perineal hygiene, Adjust clothing after toileting      Toileting assist Assist level: Touching or steadying assistance (Pt.75%)   Transfers Chair/bed transfer   Chair/bed transfer method: Ambulatory Chair/bed transfer assist level: Touching or steadying assistance (Pt >  75%) Chair/bed transfer assistive device: Armrests, Medical sales representative     Max distance: 150 Assist level: Touching or steadying assistance (Pt > 75%)   Wheelchair   Type: Manual Max wheelchair distance: 130ft  Assist Level: Touching or steadying assistance (Pt > 75%)  Cognition Comprehension Comprehension assist level: Understands complex 90% of the time/cues 10% of the time  Expression Expression assist level: Expresses complex ideas: With extra time/assistive device  Social Interaction Social Interaction assist level: Interacts appropriately with others with medication or extra time (anti-anxiety, antidepressant).  Problem Solving Problem solving assist level: Solves complex problems: With extra time  Memory Memory assist level: More than reasonable amount of time   Medical Problem List and Plan: 1. Left hemiparesis with facial droop and slurred speechsecondary to right MCA/lenticulostriate region infarct -continue PT,OT, SLP- progressing from walker to cane 2. DVT Prophylaxis/Anticoagulation: Subcutaneous Lovenox. Monitor platelet counts and any signs of bleeding 3. Pain Management/OA left knee:  Has crepitus but no pain with ROM  -voltaren,ice to left knee  -consider bracing if needed for support  -tramadol prn 4. Mood: Provide emotional support 5. Neuropsych: This patient iscapable of making decisions on hisown behalf. 6. Skin/Wound Care: Routine skin checks 7. Fluids/Electrolytes/Nutrition: good po intake    8.Hypertension. Norvasc increased to 10mg  daily for better control. Started 5/31 Vitals:   12/09/16 0430 12/09/16 1630  BP: 138/86 (!) 151/95  Pulse: 75 (!) 112  Resp: 18 18  Temp: 98.1 F (36.7 C) 98.2 F (36.8 C)   9.History of CAD with CABG. No chest pain or shortness of breath. Continue Plavix 10.Tobacco abuse. Counseling 11.Hyperlipidemia. Lipitor 12.PUD/GI. Pepcid daily  -BM on admission   LOS (Days) 3 A FACE TO  FACE EVALUATION WAS PERFORMED  Charlett Blake, MD 12/10/2016 7:05 AM

## 2016-12-10 NOTE — Progress Notes (Signed)
Physical Therapy Session Note  Patient Details  Name: Brandon Martin MRN: [id number] Date of Birth: 1965-11-24  Today's Date: 12/10/2016 PT Individual Time: 1300-1357 PT Individual Time Calculation (min): 57 min   Short Term Goals: Week 1:  PT Short Term Goal 1 (Week 1): STG= LTG due to ELOS  Skilled Therapeutic Interventions/Progress Updates:     Pt received sitting in WC and agreeable to PT Gait in room to BR for urination using SBQC and supervision assist from PT. Pt performed clothing management with supervision assist from PT as well as for sit<>stand for toilet using rail to stand.  Ambulatory transfer to [facility] with [facility] as well as supervision assist from PT. Pt transported to entrance of hospital in [facility] for energy conservation.  Gait training on unlevel concrete surface with [facility] and supervision assist from PT 2x 12f. Pt noted to have one near LOB, but able to self correct with hip strategy to prevent LOB.  Pt transported pt to hehab gym with total assist [facility].   Ambulatory transfer to Nustep as listed above.nustep reciprocal movement and endurance training x 12 minutes with L UE hand support. Mod cues from PT for proper muscle activation and ROM at shoulder and elbow on the LUE. Cues also from  PT to improved hip control while LLE hip flexion.   Dymanic balance training at stairs for lateral step ups and reciprocal foot taps on 6 inch step. Completed x 10BLE for each exercise. Min assist and min cues from PT to improved hip stability and proper ROM at hip and knee on the LLE.   Patient returned too room and [facility] and all needs met.       Therapy Documentation Precautions:  Precautions Precautions: Fall Precaution Comments: mild impulsivity Restrictions Weight Bearing Restrictions: No Pain: 0/10   See Function Navigator for Current Functional Status.   Therapy/Group: Individual Therapy  ALorie Phenix6/07/2016,  1:59 PM

## 2016-12-10 NOTE — Progress Notes (Signed)
Physical Therapy Session Note  Patient Details  Name: Brandon Martin MRN: [id number] Date of Birth: October 26, 1965  Today's Date: 12/10/2016 PT Individual Time: 1000-1100 PT Individual Time Calculation (min): 60 min   Short Term Goals: Week 1:  PT Short Term Goal 1 (Week 1): STG= LTG due to ELOS  Skilled Therapeutic Interventions/Progress Updates:    no c/o pain, session focus on gait training with SBQC and LUE NMR.    Pt able to don teds and shoes with set up assist from therapist.  Gait to therapy gym using RW with L hand splint, supervision.  PT instructed pt in gait with SBQC x200' with min guard and min verbal cues for forward gaze and gait pattern.  Pt reporting pain in anterior L shoulder without RW for support, added give-mohr sling and gait x200' with [facility] with improvement in pain.  NMR for LUE with cup stacking activity, support from therapist at pt's elbow and wrist to reduce gravity.  Weight bearing on LUE with RUE tapping numbered disks 2 trials to fatigue.  Pt ambulated back to room at end of session and left in recliner with call bell in reach and needs met.   Therapy Documentation Precautions:  Precautions Precautions: Fall Precaution Comments: mild impulsivity Restrictions Weight Bearing Restrictions: No   See Function Navigator for Current Functional Status.   Therapy/Group: Individual Therapy  Walburga Hudman E Penven-Crew 12/10/2016, 11:13 AM

## 2016-12-10 NOTE — Progress Notes (Signed)
Occupational Therapy Session Note  Patient Details  Name: Brandon Martin MRN: [id number] Date of Birth: 1966-07-06  Today's Date: 12/10/2016 OT Individual Time: 1027-2536 OT Individual Time Calculation (min): 79 min   Short Term Goals: Week 1:  OT Short Term Goal 1 (Week 1): Pt will bathe UB with supervision/ set-up using modified techniques PRN OT Short Term Goal 2 (Week 1): Pt will dress LB with supervision OT Short Term Goal 3 (Week 1): Pt will recall hemi dressing techniques for UB and LB without need for VCs.  OT Short Term Goal 4 (Week 1): Pt will ambulate to gather clothing in prep for showering task with supervision using LRAD  Skilled Therapeutic Interventions/Progress Updates:    OT treatment session focused on L NMR, L e-stem, standing balance and functional use of L UE within bathing/dressing tasks. Pt ambulated with quad cane to therapy gym. L UE -estem focused on L wrist flexors and extensors for 15 mins. L NMR in gravity eliminated sidelying position focused on scap protraction/retraction, shoulder abduction/adduction/flex/ext, and elbow flex/ext. Pt returned to room in similar fashion for bathing/dressing. Supervision with min guard A for turning to sit onto shower chair. Hand over hand A to integrate L UE into bathing to wash R UE and LLE. Pt with 1 posterior LOB when standing to wash buttocks in shower requiring min A to correct. Pt demonstrated good recall of hemi-techniques for dressing and was able to complete LB dressing with overall  Set up A and close supervision for balance when pulling up pants. Min A to thread L UE through shirt, then able to complete further. Pt left seated in recliner at end of session with RN present and needs met.   Therapy Documentation Precautions:  Precautions Precautions: Fall Precaution Comments: mild impulsivity Restrictions Weight Bearing Restrictions: No Pain:  none denies pain See Function Navigator for Current Functional  Status.   Therapy/Group: Individual Therapy  Valma Cava 12/10/2016, 4:11 PM

## 2016-12-11 ENCOUNTER — Inpatient Hospital Stay (HOSPITAL_COMMUNITY): Payer: Medicare Other | Admitting: Physical Therapy

## 2016-12-11 ENCOUNTER — Inpatient Hospital Stay (HOSPITAL_COMMUNITY): Payer: Medicare Other

## 2016-12-11 ENCOUNTER — Inpatient Hospital Stay (HOSPITAL_COMMUNITY): Payer: Medicare Other | Admitting: Occupational Therapy

## 2016-12-11 MED ORDER — ALUM & MAG HYDROXIDE-SIMETH 200-200-20 MG/5ML PO SUSP
30.0000 mL | Freq: Four times a day (QID) | ORAL | Status: DC | PRN
  Administered 2016-12-11 – 2016-12-15 (×4): 30 mL via ORAL
  Filled 2016-12-11 (×4): qty 30

## 2016-12-11 NOTE — Progress Notes (Signed)
Occupational Therapy Session Note  Patient Details  Name: Brandon Martin MRN: [id number] Date of Birth: November 08, 1965  Today's Date: 12/11/2016 OT Individual Time: 1449-1535 OT Individual Time Calculation (min): 46 min    Short Term Goals: Week 1:  OT Short Term Goal 1 (Week 1): Pt will bathe UB with supervision/ set-up using modified techniques PRN OT Short Term Goal 2 (Week 1): Pt will dress LB with supervision OT Short Term Goal 3 (Week 1): Pt will recall hemi dressing techniques for UB and LB without need for VCs.  OT Short Term Goal 4 (Week 1): Pt will ambulate to gather clothing in prep for showering task with supervision using LRAD  Skilled Therapeutic Interventions/Progress Updates:    Tx focus on L UE NMR and standing balance.   Pt greeted in recliner, ready to go.  He ambulated to therapy apartment with supervision and SBQC. Had him engage in countertop washing, sidestepping without device, actively using L UE without R UE assist and extra time. Grasp/release practice with plastic fruit while seated. Pt elevating L UE to place fruit back in bowl. Manual facilitation at scapula/shoulder to minimize shoulder hike. Also practiced grasp/release with large spatulas/spoons and spices. Pt requiring extra time, but could complete these tasks with Min A from R UE or therapist. 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 Pt implementing this during tx (instructing his hand: "Open for that wrench!"). Afterwards pt ambulated back to room in manner as written above. He was left in recliner with all needs within reach.    Therapy Documentation Precautions:  Precautions Precautions: Fall Precaution Comments: mild impulsivity Restrictions Weight Bearing Restrictions: No   Pain: No c/o pain during tx    ADL:     See Function Navigator for Current Functional Status.   Therapy/Group: Individual  Therapy  Johnta Couts A Shatasia Cutshaw 12/11/2016, 4:12 PM

## 2016-12-11 NOTE — Progress Notes (Signed)
Physical Therapy Session Note  Patient Details  Name: Brandon Martin MRN: [id number] Date of Birth: 09/26/1965  Today's Date: 12/11/2016 PT Individual Time: 0918-1020 PT Individual Time Calculation (min): 62 min   Short Term Goals: Week 1:  PT Short Term Goal 1 (Week 1): STG= LTG due to ELOS  Skilled Therapeutic Interventions/Progress Updates:   Pt received sitting in WC and agreeable to PT  PT instructed pt in gait training to rehab gym x 166f and 1525fwith supervision assist and SBQC. Min cues for improved symmerty of gait pattern and step length.  Dynamic gait training with CQ to step over 3, 1 inch obstacles x 3 and over 2, 4 inch bolsters x4. Weave through 5 cones and step up/over 6 inch curb. PT provided min assist throughout to imrpoved posture and decreased compensatory movements while advancing the LLE.  Dynamic gait training without AD to weave through 5 cones x 4 with min assist from PT. Mod cues from PT for improved symmetrical gait pattern.  Dynamic balance standing on airex pad lateal reaches to the L and the R with the R UE. Emphasis on imrpoved WB throughout LLE when reaching to the L. Pt reports increased pani in the L knee with increased lateral weight shift to the L.  Nustep reciprocal movement training x 8 minutes with LUE hand support. Min multimodal cues for improved scapular and shoulder movement to improve muscle timing and activation as well as proper speed and symmetrical movement.   Throughout treatment, pt performed sit<>stand and ambulatory transfers with supervision assist and [facility] returned too room and [facility] in reach and all needs met.        Therapy Documentation Precautions:  Precautions Precautions: Fall Precaution Comments: mild impulsivity Restrictions Weight Bearing Restrictions: No Pain: Pain Assessment Pain Assessment: No/denies pain   See Function Navigator for Current Functional  Status.   Therapy/Group: Individual Therapy  AuLorie Phenix/08/2016, 10:26 AM

## 2016-12-11 NOTE — Progress Notes (Signed)
[place] PHYSICAL MEDICINE & REHABILITATION     PROGRESS NOTE    Subjective/Complaints: Felt cramping on left side again last night while sleeping. Better this am.knee feels better  ROS: pt denies nausea, vomiting, diarrhea, cough, shortness of breath or chest pain    Objective: Vital Signs: Blood pressure 134/83, pulse 82, temperature 98 F (36.7 C), temperature source Oral, resp. rate 16, height 5\' 8"  (1.727 m), weight 90.6 kg (199 lb 11.8 oz), SpO2 97 %. No results found. No results for input(s): WBC, HGB, HCT, PLT in the last 72 hours. No results for input(s): NA, K, CL, GLUCOSE, BUN, CREATININE, CALCIUM in the last 72 hours.  Invalid input(s): CO CBG (last 3)  No results for input(s): GLUCAP in the last 72 hours.  Wt Readings from Last 3 Encounters:  12/08/16 90.6 kg (199 lb 11.8 oz)  12/05/16 92.6 kg (204 lb 3.2 oz)  11/17/12 93.3 kg (205 lb 9.6 oz)    Physical Exam:  HENT:  Dentition fair Eyes: PERRL Neck: Normal range of motion. Neck supple. No thyromegalypresent.  Cardiovascular:  RRR Respiratory: CTA B.  GI: Soft. Bowel sounds are normal. He exhibits no distension.  Skin. Warm and dry Musculoskeletal: Crepitus left knee.no shoulder pain with AROM Neurological: He is alertand oriented to person, place, and time. Coordinationabnormal.  Alert. Left central 7 present. Speech remains slightly dysarthric. LUE: 2/5 deltoid, 3+/5 biceps, triceps, and 3/5 wrist/1-2 hand. LLE:  4/5 HF, KE and ADF/PF. No sensory deficits--motor and sensory exam unchanged Psych: pleasant  Assessment/Plan: 1. Functional and mobility deficits secondary to right MCA infarct which require 3+ hours per day of interdisciplinary therapy in a comprehensive inpatient rehab setting. Physiatrist is providing close team supervision and 24 hour management of active medical problems listed below. Physiatrist and rehab team continue to assess barriers to discharge/monitor patient progress toward  functional and medical goals.  Function:  Bathing Bathing position   Position: Shower  Bathing parts Body parts bathed by patient: Left arm, Right upper leg, Left upper leg, Chest, Abdomen, Right lower leg, Front perineal area, Left lower leg, Buttocks Body parts bathed by helper: Right arm, Back  Bathing assist Assist Level: Touching or steadying assistance(Pt > 75%)      Upper Body Dressing/Undressing Upper body dressing   What is the patient wearing?: Pull over shirt/dress     Pull over shirt/dress - Perfomed by patient: Thread/unthread left sleeve Pull over shirt/dress - Perfomed by helper: Thread/unthread right sleeve, Put head through opening, Pull shirt over trunk        Upper body assist Assist Level: Touching or steadying assistance(Pt > 75%)      Lower Body Dressing/Undressing Lower body dressing   What is the patient wearing?: Underwear, Pants, Socks, [employer], [employer] - Performed by patient: Thread/unthread right underwear leg, Thread/unthread left underwear leg, Pull underwear up/down Underwear - Performed by helper: Pull underwear up/down Pants- Performed by patient: Thread/unthread right pants leg, Thread/unthread left pants leg, Pull pants up/down Pants- Performed by helper: Pull pants up/down     Socks - Performed by patient: Don/doff right sock, Don/doff left sock   Shoes - Performed by patient: Don/doff right shoe, Don/doff left shoe       TED Hose - Performed by patient: Don/doff left TED hose, Don/doff right TED hose TED Hose - Performed by helper: Don/doff right TED hose, Don/doff left TED hose  Lower body assist Assist for lower body dressing: Supervision or verbal cues  Toileting Toileting   Toileting steps completed by patient: Adjust clothing prior to toileting, Performs perineal hygiene, Adjust clothing after toileting      Toileting assist Assist level: Touching or steadying assistance (Pt.75%)   Transfers Chair/bed transfer    Chair/bed transfer method: Ambulatory Chair/bed transfer assist level: Supervision or verbal cues Chair/bed transfer assistive device: Librarian, academic     Max distance: 141ft  Assist level: Supervision or verbal cues   Wheelchair   Type: Manual Max wheelchair distance: 146ft  Assist Level: Touching or steadying assistance (Pt > 75%)  Cognition Comprehension Comprehension assist level: Understands complex 90% of the time/cues 10% of the time  Expression Expression assist level: Expresses complex ideas: With extra time/assistive device  Social Interaction Social Interaction assist level: Interacts appropriately with others with medication or extra time (anti-anxiety, antidepressant).  Problem Solving Problem solving assist level: Solves complex problems: With extra time  Memory Memory assist level: More than reasonable amount of time   Medical Problem List and Plan: 1. Left hemiparesis with facial droop and slurred speechsecondary to right MCA/lenticulostriate region infarct -continue PT,OT, SLP- progressing from walker to cane 2. DVT Prophylaxis/Anticoagulation: Subcutaneous Lovenox. Monitor platelet counts and any signs of bleeding 3. Pain Management/OA left knee:     -voltaren,ice to left knee has been helpful  -consider bracing if needed for support  -tramadol prn 4. Mood: Provide emotional support 5. Neuropsych: This patient iscapable of making decisions on hisown behalf. 6. Skin/Wound Care: Routine skin checks 7. Fluids/Electrolytes/Nutrition: good po intake    8.Hypertension. Norvasc increased to 10mg  daily for better control. Started 5/31--improved Vitals:   12/10/16 1545 12/11/16 0430  BP: (!) 150/93 134/83  Pulse: (!) 112 82  Resp: 18 16  Temp: 97.5 F (36.4 C) 98 F (36.7 C)   9.History of CAD with CABG. No chest pain or shortness of breath. Continue Plavix 10.Tobacco abuse. Counseling 11.Hyperlipidemia.  Lipitor 12.PUD/GI. Pepcid daily  -BM on admission   LOS (Days) 4 A FACE TO FACE EVALUATION WAS PERFORMED  Alger Simons T, MD 12/11/2016 8:00 AM

## 2016-12-11 NOTE — Progress Notes (Signed)
Occupational Therapy Session Note  Patient Details  Name: Brandon Martin MRN: [id number] Date of Birth: March 24, 1966  Today's Date: 12/11/2016 OT Individual Time: 1100-1200 OT Individual Time Calculation (min): 60 min    Short Term Goals: Week 1:  OT Short Term Goal 1 (Week 1): Pt will bathe UB with supervision/ set-up using modified techniques PRN OT Short Term Goal 2 (Week 1): Pt will dress LB with supervision OT Short Term Goal 3 (Week 1): Pt will recall hemi dressing techniques for UB and LB without need for VCs.  OT Short Term Goal 4 (Week 1): Pt will ambulate to gather clothing in prep for showering task with supervision using LRAD  Skilled Therapeutic Interventions/Progress Updates:    1:1. No pain reported. Pt with family visiting upon entering. Pt ambulates to/from all therapeutic destinations with increased time VC for looking forward and 1 LOB to R with MIN A to correct. In gym, pt stands on blue foam pad and reaches laterally/crosses midline to obtain/throw horse shoes with CGA fading to supervision. In day room, pt sits at table and is educated on Self ROM handout with pictures. OT demonstrates each exercise and pt teaches back technique 1x10 each exercise with supervision. Exited session with pt seated in recliner with call light in reach and all needs met.     Therapy Documentation Precautions:  Precautions Precautions: Fall Precaution Comments: mild impulsivity Restrictions Weight Bearing Restrictions: No  See Function Navigator for Current Functional Status.   Therapy/Group: Individual Therapy  Tonny Branch 12/11/2016, 12:30 PM

## 2016-12-11 NOTE — Progress Notes (Signed)
Occupational Therapy Session Note  Patient Details  Name: Brandon Martin MRN: [id number] Date of Birth: [birth year]  Today's Date: 12/11/2016 OT Individual Time: 1700-1741 OT Individual Time Calculation (min): 41 min    Short Term Goals: Week 1:  OT Short Term Goal 1 (Week 1): Pt will bathe UB with supervision/ set-up using modified techniques PRN OT Short Term Goal 2 (Week 1): Pt will dress LB with supervision OT Short Term Goal 3 (Week 1): Pt will recall hemi dressing techniques for UB and LB without need for VCs.  OT Short Term Goal 4 (Week 1): Pt will ambulate to gather clothing in prep for showering task with supervision using LRAD  Skilled Therapeutic Interventions/Progress Updates:    1:1. No pain reported. Focus of session on standing balance, L UE use, funcitonal transfers, hemi dressing and ambulation in the context of ADLs. Pt ambulates into bathroom using quad cane with supervision and transfers onto TTB with touching A. Pt bathes at sit to stand level using grab bar and steadying A to wash buttocks. OT provides HOH A to wash RUE with LUE. Pt dons pull up pants, shirt, and underwear using hemi dressing techniques seated in recliner without VC and pt is able to use strategies as OT is trying to distract pt in conversation. Pt don socks and shoes with set up. Exited session with pt seated in recliner with call light in reach and all needs met.   Therapy Documentation Precautions:  Precautions Precautions: Fall Precaution Comments: mild impulsivity Restrictions Weight Bearing Restrictions: No  See Function Navigator for Current Functional Status.   Therapy/Group: Individual Therapy  Tonny Branch 12/11/2016, 6:00 PM

## 2016-12-12 ENCOUNTER — Inpatient Hospital Stay (HOSPITAL_COMMUNITY): Payer: Medicare Other | Admitting: Occupational Therapy

## 2016-12-12 MED ORDER — NICOTINE 14 MG/24HR TD PT24
14.0000 mg | MEDICATED_PATCH | Freq: Every day | TRANSDERMAL | Status: DC
  Administered 2016-12-12 – 2016-12-16 (×7): 14 mg via TRANSDERMAL
  Filled 2016-12-12 (×7): qty 1

## 2016-12-12 NOTE — Progress Notes (Signed)
[place] PHYSICAL MEDICINE & REHABILITATION     PROGRESS NOTE    Subjective/Complaints: Left arm sore from therapy but otherwise feeling well   ROS: pt denies nausea, vomiting, diarrhea, cough, shortness of breath or chest pain    Objective: Vital Signs: Blood pressure 121/72, pulse 79, temperature 97.8 F (36.6 C), temperature source Oral, resp. rate 18, height 5\' 8"  (1.727 m), weight 90.6 kg (199 lb 11.8 oz), SpO2 99 %. No results found. No results for input(s): WBC, HGB, HCT, PLT in the last 72 hours. No results for input(s): NA, K, CL, GLUCOSE, BUN, CREATININE, CALCIUM in the last 72 hours.  Invalid input(s): CO CBG (last 3)  No results for input(s): GLUCAP in the last 72 hours.  Wt Readings from Last 3 Encounters:  12/08/16 90.6 kg (199 lb 11.8 oz)  12/05/16 92.6 kg (204 lb 3.2 oz)  11/17/12 93.3 kg (205 lb 9.6 oz)    Physical Exam:  HENT:  Dentition fair Eyes: PERRL Neck: Normal range of motion. Neck supple. No thyromegalypresent.  Cardiovascular: RRR Respiratory: CTA B.  GI: Soft. Bowel sounds are normal. He exhibits no distension.  Skin. Warm and dry Musculoskeletal: Crepitus left knee.no shoulder pain with AROM Neurological: He is alertand oriented to person, place, and time. Coordinationabnormal.  Alert. Left central 7 present with dysarthric speech. LUE: 2/5 deltoid, 3+/5 biceps, triceps, and 3/5 wrist/1-2 hand. LLE:  4/5 HF, KE and ADF/PF. No sensory deficits--motor and sensory exam unchanged Psych: pleasant  Assessment/Plan: 1. Functional and mobility deficits secondary to right MCA infarct which require 3+ hours per day of interdisciplinary therapy in a comprehensive inpatient rehab setting. Physiatrist is providing close team supervision and 24 hour management of active medical problems listed below. Physiatrist and rehab team continue to assess barriers to discharge/monitor patient progress toward functional and medical  goals.  Function:  Bathing Bathing position   Position: Shower  Bathing parts Body parts bathed by patient: Left arm, Right upper leg, Left upper leg, Chest, Abdomen, Right lower leg, Front perineal area, Left lower leg, Buttocks Body parts bathed by helper: Right arm, Back  Bathing assist Assist Level: Touching or steadying assistance(Pt > 75%)      Upper Body Dressing/Undressing Upper body dressing   What is the patient wearing?: Pull over shirt/dress     Pull over shirt/dress - Perfomed by patient: Thread/unthread right sleeve, Thread/unthread left sleeve, Put head through opening, Pull shirt over trunk Pull over shirt/dress - Perfomed by helper: Thread/unthread right sleeve, Put head through opening, Pull shirt over trunk        Upper body assist Assist Level: Supervision or verbal cues      Lower Body Dressing/Undressing Lower body dressing   What is the patient wearing?: Underwear, Pants, Socks, [employer], [employer] - Performed by patient: Thread/unthread right underwear leg, Thread/unthread left underwear leg, Pull underwear up/down Underwear - Performed by helper: Pull underwear up/down Pants- Performed by patient: Thread/unthread right pants leg, Thread/unthread left pants leg, Pull pants up/down Pants- Performed by helper: Pull pants up/down     Socks - Performed by patient: Don/doff right sock, Don/doff left sock   Shoes - Performed by patient: Don/doff right shoe, Don/doff left shoe       TED Hose - Performed by patient: Don/doff left TED hose, Don/doff right TED hose TED Hose - Performed by helper: Don/doff right TED hose, Don/doff left TED hose  Lower body assist Assist for lower body dressing: Supervision or verbal cues  Toileting Toileting   Toileting steps completed by patient: Adjust clothing prior to toileting, Performs perineal hygiene, Adjust clothing after toileting      Toileting assist Assist level: Touching or steadying assistance  (Pt.75%)   Transfers Chair/bed transfer   Chair/bed transfer method: Ambulatory Chair/bed transfer assist level: Supervision or verbal cues Chair/bed transfer assistive device: Librarian, academic     Max distance: 126ft Assist level: Supervision or verbal cues   Wheelchair   Type: Manual Max wheelchair distance: 112ft  Assist Level: Touching or steadying assistance (Pt > 75%)  Cognition Comprehension Comprehension assist level: Understands complex 90% of the time/cues 10% of the time  Expression Expression assist level: Expresses complex ideas: With extra time/assistive device  Social Interaction Social Interaction assist level: Interacts appropriately with others with medication or extra time (anti-anxiety, antidepressant).  Problem Solving Problem solving assist level: Solves complex problems: With extra time  Memory Memory assist level: More than reasonable amount of time   Medical Problem List and Plan: 1. Left hemiparesis with facial droop and slurred speechsecondary to right MCA/lenticulostriate region infarct -continue PT,OT, SLP- progressing from walker to cane---walked without a device yesterday 2. DVT Prophylaxis/Anticoagulation: Subcutaneous Lovenox. Monitor platelet counts and any signs of bleeding 3. Pain Management/OA left knee:     -voltaren,ice to left knee has been helpful  -consider bracing if needed for support  -tramadol prn 4. Mood: Provide emotional support 5. Neuropsych: This patient iscapable of making decisions on hisown behalf. 6. Skin/Wound Care: Routine skin checks 7. Fluids/Electrolytes/Nutrition: good po intake    8.Hypertension. Norvasc increased to 10mg  daily for better control. Reasonable control Vitals:   12/11/16 1422 12/12/16 0457  BP: (!) 151/87 121/72  Pulse: 90 79  Resp: 18 18  Temp: 97.8 F (36.6 C) 97.8 F (36.6 C)   9.History of CAD with CABG. No chest pain or shortness of breath. Continue  Plavix 10.Tobacco abuse. Counseling 11.Hyperlipidemia. Lipitor 12.PUD/GI. Pepcid daily  -BM on admission   LOS (Days) 5 A Drew T, MD 12/12/2016 8:06 AM

## 2016-12-12 NOTE — Progress Notes (Signed)
Occupational Therapy Session Note  Patient Details  Name: Brandon Martin MRN: [id number] Date of Birth: 1965/11/09  Today's Date: 12/12/2016 OT Individual Time: 1000-1100 OT Individual Time Calculation (min): 60 min    Short Term Goals: Week 1:  OT Short Term Goal 1 (Week 1): Pt will bathe UB with supervision/ set-up using modified techniques PRN OT Short Term Goal 2 (Week 1): Pt will dress LB with supervision OT Short Term Goal 3 (Week 1): Pt will recall hemi dressing techniques for UB and LB without need for VCs.  OT Short Term Goal 4 (Week 1): Pt will ambulate to gather clothing in prep for showering task with supervision using LRAD  Skilled Therapeutic Interventions/Progress Updates:    Pt seen for OT session focusing on ADL re-training and neuro re-ed. Pt sitting up in recliner upon arrival, agreeable to tx session, voiced complaints of soreness in L UE, reports being pre-medicated and ready for therapy. He required increased assist for donning R knee brace, unable to direct caregiver with proper set-up.  He ambulated into bathroom with SPC, completed toileting task with supervision for continent BM. Hand hygiene completed standing at sink, pt able to incorporate L UE into functional task with min cuing. He ambulated to therapy gym with supervision using [place]. Completed E-stim for L wrist extensor- with success. Without use of E-stim, pt abe to achieve ~1/2 wrist extension mobility. He then completed fine motor movements of digits. Initially only able to flex with increased time. At end of session, pt able to extend digits with significantly increased time and effort, not yet at functional level but pt very motivated and excited regarding progress. Cont to educate regarding CVA, rate of return, and reason for decreased strength/ movement. Pt returned to room in same manner as described above. Left seated in recliner with all needs in reach.    Therapy Documentation Precautions:   Precautions Precautions: Fall Precaution Comments: mild impulsivity Restrictions Weight Bearing Restrictions: No  See Function Navigator for Current Functional Status.   Therapy/Group: Individual Therapy  Lewis, Margel Joens C 12/12/2016, 6:54 AM

## 2016-12-13 ENCOUNTER — Inpatient Hospital Stay (HOSPITAL_COMMUNITY): Payer: Medicare Other | Admitting: Physical Therapy

## 2016-12-13 ENCOUNTER — Inpatient Hospital Stay (HOSPITAL_COMMUNITY): Payer: Medicare Other | Admitting: Occupational Therapy

## 2016-12-13 MED ORDER — SENNOSIDES-DOCUSATE SODIUM 8.6-50 MG PO TABS
2.0000 | ORAL_TABLET | Freq: Every day | ORAL | Status: DC
  Administered 2016-12-13 – 2016-12-15 (×3): 2 via ORAL
  Filled 2016-12-13 (×3): qty 2

## 2016-12-13 NOTE — Progress Notes (Signed)
Occupational Therapy Session Note  Patient Details  Name: Brandon Martin MRN: [id number] Date of Birth: Apr 16, 1966  Today's Date: 12/13/2016 OT Individual Time: 0930-1030 OT Individual Time Calculation (min): 60 min    Short Term Goals: Week 1:  OT Short Term Goal 1 (Week 1): Pt will bathe UB with supervision/ set-up using modified techniques PRN OT Short Term Goal 2 (Week 1): Pt will dress LB with supervision OT Short Term Goal 3 (Week 1): Pt will recall hemi dressing techniques for UB and LB without need for VCs.  OT Short Term Goal 4 (Week 1): Pt will ambulate to gather clothing in prep for showering task with supervision using LRAD      Skilled Therapeutic Interventions/Progress Updates:    Pt seen this session for LUE NMR. Pt was excited to show me that he is now able to lift his L wrist against gravity in extension.  He stated that he is anxious to have his fingers to extend out too.  Pt ambulated with cane to gym. He worked on bicep/tricep facilitation with pushing and pulling stool back and forth with min A while grasping the handle of the stool.  Pt then worked on grasp and release with assist to release cones and active elbow flexion with assist. Cues to not compensate with his L shoulder.    Estim for 15 min for finger extensors on small muscle atrophy setting at intensity 25.  A/arom for full extension of digits 3-5.   Post estim, pt worked on towel scrunches with arm supported with a focus on facilitating finger extension. Pt demonstrated active extension in all digits for 25% of his ROM.  Pt happy to see this change. Continued a/arom of finger extension. Pt ambulated back to room and used pillow to support his L arm.    Pt in room with all needs met.   Therapy Documentation Precautions:  Precautions Precautions: Fall Precaution Comments: mild impulsivity Restrictions Weight Bearing Restrictions: No  Pain: no c/o pain   ADL:      See Function Navigator for Current  Functional Status.   Therapy/Group: Individual Therapy  [place] 12/13/2016, 12:46 PM

## 2016-12-13 NOTE — Progress Notes (Signed)
Physical Therapy Session Note  Patient Details  Name: Brandon Martin MRN: [id number] Date of Birth: 12-Apr-1966  Today's Date: 12/13/2016 PT Individual Time: 1100-1200 PT Individual Time Calculation (min): 60 min   Short Term Goals: Week 1:  PT Short Term Goal 1 (Week 1): STG= LTG due to ELOS  Skilled Therapeutic Interventions/Progress Updates:    pt reports ongoing pain in L knee and L shoulder but agreeable to therapy session.  Session focus on NMR, gait, and activity tolerance.   Pt ambulates to therapy gym with [facility] with distant supervision, no LOB even with sudden changes in direction.  Ambulation back to room at end of session with no device and supervision but with increased L lateral trunk lean and antalgic pattern.    NMR on Biodex focus on limits of stability training with pt scoring an average of 56% accuracy over an average of 30.67 seconds on easy setting.  Pt with tendency to overshoot but corrects fairly well with verbal cues for slow, deliberate weight shifts.  NMR on nustep x8 minutes with 4 extremities focus on reciprocal stepping pattern retraining, LUE/LLE forced use, and activity tolerance.   PT applied kinesiotape to pt's L knee with support strip and 2 decompression strips with pt reported decrease in pain with ROM and weight bearing.  PT also applied kinesiotape to pt's L shoulder with decompression strip along glenohumeral line with 3 decompression strips over AC joint, upper trap, and scapular spine with pt reported decreased pain.    Pt returned to room at end of session, toileted with set up assist, and returned to chair with call bell in reach and needs met.   Therapy Documentation Precautions:  Precautions Precautions: Fall Precaution Comments: mild impulsivity Restrictions Weight Bearing Restrictions: No   See Function Navigator for Current Functional Status.   Therapy/Group: Individual Therapy  [name] 12/13/2016, 12:22 PM

## 2016-12-13 NOTE — Plan of Care (Signed)
Problem: Consults Goal: RH STROKE PATIENT EDUCATION See Patient Education module for education specifics   Outcome: Progressing Verbalized understanding of stroke education  Problem: RH BOWEL ELIMINATION Goal: RH STG MANAGE BOWEL W/MEDICATION W/ASSISTANCE STG Manage Bowel with Medication with min Assistance.  Outcome: Progressing No bowel issues reported, last BM 12/12/2016  Problem: RH SKIN INTEGRITY Goal: RH STG SKIN FREE OF INFECTION/BREAKDOWN No new breakdown with min assist   Outcome: Progressing No skin issues noted  Problem: RH SAFETY Goal: RH STG ADHERE TO SAFETY PRECAUTIONS W/ASSISTANCE/DEVICE STG Adhere to Safety Precautions With min Assistance/Device.  Outcome: Progressing Safety precautions maintained  Problem: RH PAIN MANAGEMENT Goal: RH STG PAIN MANAGED AT OR BELOW PT'S PAIN GOAL <4 out of 10.   Outcome: Progressing C/O mild pain in the left shoulder that is managed with Voltaren cream   Problem: RH KNOWLEDGE DEFICIT Goal: RH STG INCREASE KNOWLEDGE OF HYPERTENSION Pt will demonstrate increased knowledge of hypertension management with min assist upon discharge.   Outcome: Progressing Patient verbalized understanding of hypertension and regimen

## 2016-12-13 NOTE — Progress Notes (Signed)
[place] PHYSICAL MEDICINE & REHABILITATION     PROGRESS NOTE    Subjective/Complaints:  Left knee "popping " when ambulating, fell at home PTA concerned that he hurt it  ROS: pt denies nausea, vomiting, diarrhea, cough, shortness of breath or chest pain    Objective: Vital Signs: Blood pressure (!) 158/83, pulse 95, temperature 97.7 F (36.5 C), temperature source Oral, resp. rate 18, height 5\' 8"  (1.727 m), weight 90.6 kg (199 lb 11.8 oz), SpO2 98 %. No results found. No results for input(s): WBC, HGB, HCT, PLT in the last 72 hours. No results for input(s): NA, K, CL, GLUCOSE, BUN, CREATININE, CALCIUM in the last 72 hours.  Invalid input(s): CO CBG (last 3)  No results for input(s): GLUCAP in the last 72 hours.  Wt Readings from Last 3 Encounters:  12/08/16 90.6 kg (199 lb 11.8 oz)  12/05/16 92.6 kg (204 lb 3.2 oz)  11/17/12 93.3 kg (205 lb 9.6 oz)    Physical Exam:  HENT:  Dentition fair Eyes: PERRL Neck: Normal range of motion. Neck supple. No thyromegalypresent.  Cardiovascular: RRR Respiratory: CTA B.  GI: Soft. Bowel sounds are normal. He exhibits no distension.  Skin. Warm and dry Musculoskeletal: Crepitus left knee.no shoulder pain with AROM Neurological: He is alertand oriented to person, place, and time. Coordinationabnormal.  Alert. Left central 7 present with dysarthric speech. LUE: 2/5 deltoid, 3+/5 biceps, triceps, and 3/5 wrist/1-2 hand. LLE:  4/5 HF, KE and ADF/PF. No sensory deficits--motor and sensory exam unchanged Psych: pleasant  Assessment/Plan: 1. Functional and mobility deficits secondary to right MCA infarct which require 3+ hours per day of interdisciplinary therapy in a comprehensive inpatient rehab setting. Physiatrist is providing close team supervision and 24 hour management of active medical problems listed below. Physiatrist and rehab team continue to assess barriers to discharge/monitor patient progress toward functional and  medical goals.  Function:  Bathing Bathing position   Position: Shower  Bathing parts Body parts bathed by patient: Left arm, Right upper leg, Left upper leg, Chest, Abdomen, Right lower leg, Front perineal area, Left lower leg, Buttocks Body parts bathed by helper: Right arm, Back  Bathing assist Assist Level: Touching or steadying assistance(Pt > 75%)      Upper Body Dressing/Undressing Upper body dressing   What is the patient wearing?: Pull over shirt/dress     Pull over shirt/dress - Perfomed by patient: Thread/unthread right sleeve, Thread/unthread left sleeve, Put head through opening, Pull shirt over trunk Pull over shirt/dress - Perfomed by helper: Thread/unthread right sleeve, Put head through opening, Pull shirt over trunk        Upper body assist Assist Level: Supervision or verbal cues      Lower Body Dressing/Undressing Lower body dressing   What is the patient wearing?: Underwear, Pants, Socks, [employer], [employer] - Performed by patient: Thread/unthread right underwear leg, Thread/unthread left underwear leg, Pull underwear up/down Underwear - Performed by helper: Pull underwear up/down Pants- Performed by patient: Thread/unthread right pants leg, Thread/unthread left pants leg, Pull pants up/down Pants- Performed by helper: Pull pants up/down     Socks - Performed by patient: Don/doff right sock, Don/doff left sock   Shoes - Performed by patient: Don/doff right shoe, Don/doff left shoe       TED Hose - Performed by patient: Don/doff left TED hose, Don/doff right TED hose TED Hose - Performed by helper: Don/doff right TED hose, Don/doff left TED hose  Lower body assist Assist for lower body  dressing: Supervision or verbal cues      Toileting Toileting   Toileting steps completed by patient: Adjust clothing prior to toileting, Performs perineal hygiene, Adjust clothing after toileting      Toileting assist Assist level: Touching or steadying  assistance (Pt.75%)   Transfers Chair/bed transfer   Chair/bed transfer method: Ambulatory Chair/bed transfer assist level: Supervision or verbal cues Chair/bed transfer assistive device: Librarian, academic     Max distance: 14ft Assist level: Supervision or verbal cues   Wheelchair   Type: Manual Max wheelchair distance: 141ft  Assist Level: Touching or steadying assistance (Pt > 75%)  Cognition Comprehension Comprehension assist level: Understands complex 90% of the time/cues 10% of the time  Expression Expression assist level: Expresses complex ideas: With extra time/assistive device  Social Interaction Social Interaction assist level: Interacts appropriately with others with medication or extra time (anti-anxiety, antidepressant).  Problem Solving Problem solving assist level: Solves complex problems: With extra time  Memory Memory assist level: More than reasonable amount of time   Medical Problem List and Plan: 1. Left hemiparesis with facial droop and slurred speechsecondary to right MCA/lenticulostriate region infarct -continue PT,OT, SLP-Left upper ext with increasing distal movement2. DVT Prophylaxis/Anticoagulation: Subcutaneous Lovenox. Monitor platelet counts and any signs of bleeding 3. Pain Management/OA left knee:   Pt concerned about increased knee pain since fall at home check Xray  -voltaren,ice to left knee has been helpful  -consider bracing if needed for support  -tramadol prn 4. Mood: Provide emotional support 5. Neuropsych: This patient iscapable of making decisions on hisown behalf. 6. Skin/Wound Care: Routine skin checks 7. Fluids/Electrolytes/Nutrition: good po intake    8.Hypertension. Norvasc increased to 10mg  daily for better control, some fluctuations but will monitor prior to any other med changes Vitals:   12/12/16 0457 12/12/16 1442  BP: 121/72 (!) 158/83  Pulse: 79 95  Resp: 18 18  Temp: 97.8 F (36.6 C)  97.7 F (36.5 C)   9.History of CAD with CABG. No chest pain or shortness of breath. Continue Plavix 10.Tobacco abuse. Counseling 11.Hyperlipidemia. Lipitor 12.PUD/GI. Pepcid daily  -BM on admission   LOS (Days) 6 A Winthrop E, MD 12/13/2016 7:34 AM

## 2016-12-13 NOTE — Progress Notes (Signed)
Occupational Therapy Session Note  Patient Details  Name: Brandon Martin CURRENT MRN: [id number] Date of Birth: February 08, 1966   Today's Date: 12/13/2016 OT Individual Time: 4801-6553 OT Individual Time Calculation (min): 90 min    Short Term Goals: Week 1:  OT Short Term Goal 1 (Week 1): Pt will bathe UB with supervision/ set-up using modified techniques PRN OT Short Term Goal 2 (Week 1): Pt will dress LB with supervision OT Short Term Goal 3 (Week 1): Pt will recall hemi dressing techniques for UB and LB without need for VCs.  OT Short Term Goal 4 (Week 1): Pt will ambulate to gather clothing in prep for showering task with supervision using LRAD  Skilled Therapeutic Interventions/Progress Updates:    OT treatment session focused on L UE NMR, e-stim, and functional use of L UE. Pt ambulated to therapy gym using [facility] with 0 LOB and supervision. L-estim for 15 mins focused on wrist/hand extension. Incorporated grasp/relesase with cups within e-stim activity using hand-over hand assist. Pt then brought into gravity eliminated side-lying position for NMRe-ed to elicit proximal to distal movement. Incorporated PNF patterns and provided joint input to bring pt through full ROM. Pt scooted to edge of mat in side-lying and L UE placed in UE ranger stand. Pt able to flex/extend elbow, flex/extend shoulder, and complete clockwise and counterclockwise circular ROM. Pt returned to sitting to continue working on wrist extension and grasp. Pt able to achieve ~50% wrist extension, then required guided assist and manual facilitation to elicit normal movement pattern of shoulder while reaching to grasp cup. Decreased shoulder hike noted with repetition. Pt then worked on functional ambulation in hallway focused on hip/knee extension. Pt completed 10 mini wall pushups with L elbow/wrist supported and returned to room at end of session. Pt left seated in recliner with needs met.   Therapy Documentation Precautions:   Precautions Precautions: Fall Precaution Comments: mild impulsivity Restrictions Weight Bearing Restrictions: No Pain:  denies pain  See Function Navigator for Current Functional Status.   Therapy/Group: Individual Therapy  Valma Cava 12/13/2016, 3:03 PM

## 2016-12-14 ENCOUNTER — Inpatient Hospital Stay (HOSPITAL_COMMUNITY): Payer: Medicare Other | Admitting: Occupational Therapy

## 2016-12-14 ENCOUNTER — Inpatient Hospital Stay (HOSPITAL_COMMUNITY): Payer: Medicare Other | Admitting: Physical Therapy

## 2016-12-14 LAB — CREATININE, SERUM
CREATININE: 1.04 mg/dL (ref 0.61–1.24)
GFR calc Af Amer: >60 mL/min (ref >60)
GFR calc non Af Amer: >60 mL/min (ref >60)

## 2016-12-14 NOTE — Plan of Care (Signed)
Problem: RH SKIN INTEGRITY Goal: RH STG SKIN FREE OF INFECTION/BREAKDOWN No new breakdown with min assist   Outcome: Progressing No skin issues noted  Problem: RH PAIN MANAGEMENT Goal: RH STG PAIN MANAGED AT OR BELOW PT'S PAIN GOAL <4 out of 10.   Outcome: Not Progressing Continue to complaint of left shoulder, knee and neck pain  Problem: RH KNOWLEDGE DEFICIT Goal: RH STG INCREASE KNOWLEDGE OF HYPERTENSION Pt will demonstrate increased knowledge of hypertension management with min assist upon discharge.   Outcome: Progressing Aware of medical history and stated that he will be comliant with his medications and treatment once dischaged

## 2016-12-14 NOTE — Progress Notes (Signed)
[place] PHYSICAL MEDICINE & REHABILITATION     PROGRESS NOTE    Subjective/Complaints: No issues overnite, knee is doing better, moving L hand more  ROS: pt denies nausea, vomiting, diarrhea, cough, shortness of breath or chest pain    Objective: Vital Signs: Blood pressure (!) 137/91, pulse 86, temperature 97.8 F (36.6 C), temperature source Oral, resp. rate 18, height 5\' 8"  (1.727 m), weight 90.6 kg (199 lb 11.8 oz), SpO2 94 %. No results found. No results for input(s): WBC, HGB, HCT, PLT in the last 72 hours.  Recent Labs  12/14/16 0438  CREATININE 1.04   CBG (last 3)  No results for input(s): GLUCAP in the last 72 hours.  Wt Readings from Last 3 Encounters:  12/08/16 90.6 kg (199 lb 11.8 oz)  12/05/16 92.6 kg (204 lb 3.2 oz)  11/17/12 93.3 kg (205 lb 9.6 oz)    Physical Exam:  HENT:  Dentition fair Eyes: PERRL Neck: Normal range of motion. Neck supple. No thyromegalypresent.  Cardiovascular: RRR Respiratory: CTA B.  GI: Soft. Bowel sounds are normal. He exhibits no distension.  Skin. Warm and dry Musculoskeletal: Crepitus left knee.no shoulder pain with AROM Neurological: He is alertand oriented to person, place, and time. Coordinationabnormal.  Alert. Left central 7 present with dysarthric speech. LUE: 2/5 deltoid, 3+/5 biceps, triceps, and 3/5 wrist/1-2 hand. LLE:  4/5 HF, KE and ADF/PF. No sensory deficits--motor and sensory exam unchanged Psych: pleasant  Assessment/Plan: 1. Functional and mobility deficits secondary to right MCA infarct which require 3+ hours per day of interdisciplinary therapy in a comprehensive inpatient rehab setting. Physiatrist is providing close team supervision and 24 hour management of active medical problems listed below. Physiatrist and rehab team continue to assess barriers to discharge/monitor patient progress toward functional and medical goals.  Function:  Bathing Bathing position   Position: Shower  Bathing  parts Body parts bathed by patient: Left arm, Right upper leg, Left upper leg, Chest, Abdomen, Right lower leg, Front perineal area, Left lower leg, Buttocks Body parts bathed by helper: Right arm, Back  Bathing assist Assist Level: Touching or steadying assistance(Pt > 75%)      Upper Body Dressing/Undressing Upper body dressing   What is the patient wearing?: Pull over shirt/dress     Pull over shirt/dress - Perfomed by patient: Thread/unthread right sleeve, Thread/unthread left sleeve, Put head through opening, Pull shirt over trunk Pull over shirt/dress - Perfomed by helper: Thread/unthread right sleeve, Put head through opening, Pull shirt over trunk        Upper body assist Assist Level: Supervision or verbal cues      Lower Body Dressing/Undressing Lower body dressing   What is the patient wearing?: Underwear, Pants, Socks, [employer], Shoes Underwear - Performed by patient: Thread/unthread right underwear leg, Thread/unthread left underwear leg, Pull underwear up/down Underwear - Performed by helper: Pull underwear up/down Pants- Performed by patient: Thread/unthread right pants leg, Thread/unthread left pants leg, Pull pants up/down Pants- Performed by helper: Pull pants up/down     Socks - Performed by patient: Don/doff right sock, Don/doff left sock   Shoes - Performed by patient: Don/doff right shoe, Don/doff left shoe       TED Hose - Performed by patient: Don/doff left TED hose, Don/doff right TED hose TED Hose - Performed by helper: Don/doff right TED hose, Don/doff left TED hose  Lower body assist Assist for lower body dressing: Supervision or verbal cues      Toileting Toileting  Toileting steps completed by patient: Adjust clothing prior to toileting, Performs perineal hygiene, Adjust clothing after toileting Toileting steps completed by helper: Adjust clothing prior to toileting, Adjust clothing after toileting (per Linden Dolin, NT partial assist with  clothing)    Toileting assist Assist level: Touching or steadying assistance (Pt.75%)   Transfers Chair/bed transfer   Chair/bed transfer method: Ambulatory Chair/bed transfer assist level: Supervision or verbal cues Chair/bed transfer assistive device: Librarian, academic     Max distance: 168ft Assist level: Supervision or verbal cues   Wheelchair   Type: Manual Max wheelchair distance: 156ft  Assist Level: Touching or steadying assistance (Pt > 75%)  Cognition Comprehension Comprehension assist level: Understands complex 90% of the time/cues 10% of the time  Expression Expression assist level: Expresses complex ideas: With extra time/assistive device  Social Interaction Social Interaction assist level: Interacts appropriately with others with medication or extra time (anti-anxiety, antidepressant).  Problem Solving Problem solving assist level: Solves complex problems: With extra time  Memory Memory assist level: More than reasonable amount of time   Medical Problem List and Plan: 1. Left hemiparesis with facial droop and slurred speechsecondary to right MCA/lenticulostriate region infarct -continue PT,OT, SLP-team conf in am 2. DVT Prophylaxis/Anticoagulation: Subcutaneous Lovenox. Monitor platelet counts and any signs of bleeding 3. Pain Management/OA left knee:  Knee pain better cancel Xray -voltaren,ice to left knee has been helpful  -consider bracing if needed for support  -tramadol prn 4. Mood: Provide emotional support 5. Neuropsych: This patient iscapable of making decisions on hisown behalf. 6. Skin/Wound Care: Routine skin checks 7. Fluids/Electrolytes/Nutrition: good po intake    8.Hypertension. Norvasc increased to 10mg  daily for better control,improving Vitals:   12/13/16 1541 12/14/16 0407  BP: 137/86 (!) 137/91  Pulse: (!) 107 86  Resp: 20 18  Temp: 98.3 F (36.8 C) 97.8 F (36.6 C)   9.History of CAD with CABG. No  chest pain or shortness of breath. Continue Plavix 10.Tobacco abuse. Counseling 11.Hyperlipidemia. Lipitor 12.PUD/GI. Pepcid daily     LOS (Days) 7 A FACE TO FACE EVALUATION WAS PERFORMED  Charlett Blake, MD 12/14/2016 7:46 AM

## 2016-12-14 NOTE — Progress Notes (Signed)
Occupational Therapy Session Note  Patient Details  Name: Brandon Martin MRN: [id number] Date of Birth: 1965/10/29  Today's Date: 12/14/2016 OT Individual Time: 1400-1500 OT Individual Time Calculation (min): 60 min    Short Term Goals: Week 1:  OT Short Term Goal 1 (Week 1): Pt will bathe UB with supervision/ set-up using modified techniques PRN OT Short Term Goal 2 (Week 1): Pt will dress LB with supervision OT Short Term Goal 3 (Week 1): Pt will recall hemi dressing techniques for UB and LB without need for VCs.  OT Short Term Goal 4 (Week 1): Pt will ambulate to gather clothing in prep for showering task with supervision using LRAD  Skilled Therapeutic Interventions/Progress Updates:    Pt seen for OT session focusing on neuro re-ed during ADL tasks. Pt sitting up in recliner upon arrival, ready for tx session. He ambulated throughout session without AD and supervision. In therapy gym, sat EOM to complete L UE and fine motor tasks utilizing Skittles candy pieces. Pt unable to use pincer grasp to pick up individual pieces, however, was able to use more of a gross grasp method in order to pick up several skittles at one time. Using spoon with built up gripper, pt able to scoop skittles onto spoon and with significantly increased time and effort able to bring spoon to mouth though with decreased smooth motor movements due to weakness. Pt required rest breaks btwn all UE tasks due to energy and concentration required to complete movements.  He then completed towel folding activity in seated position focusing on use of B UE simultaneous use. Pt with decreased ability to grasp towel edge with L UE when completing task with R UE, however, when task was isolated to L UE only pt able to complete with increased time. Pt then completed fine motor task utilizing toothpaste tube and shaving cream. Pt able to manipulate caps on both items. With increased time, pt able to apply enough pressure with L UE in order  to squeeze out self care items from container. Pt excited with improvements and functional gains with L UE.  Pt returned to prone on mat for UE "superman" stretch.  He ambulated back to room at end of session, left seated in recliner with all needs in reach.  Was planning to attempt floor transfer during session, however, due to pt's hx of knee pain and soreness he deferred this activity.   Therapy Documentation Precautions:  Precautions Precautions: Fall Precaution Comments: mild impulsivity Restrictions Weight Bearing Restrictions: No Pain:   No/ denies pain  See Function Navigator for Current Functional Status.   Therapy/Group: Individual Therapy  Lewis, Enrigue Hashimi C 12/14/2016, 3:07 PM

## 2016-12-14 NOTE — Progress Notes (Signed)
Physical Therapy Session Note  Patient Details  Name: Brandon Martin MRN: [id number] Date of Birth: 01-31-1966  Today's Date: 12/14/2016 PT Individual Time: 7124-5809 PT Individual Time Calculation (min): 25 min   Short Term Goals: Week 1:  PT Short Term Goal 1 (Week 1): STG= LTG due to ELOS  Skilled Therapeutic Interventions/Progress Updates:    no c/o pain.  Session focus on dynamic standing balance and gait with bare feet in room for bathing.  Pt ambulates throughout room with bare feet and distant supervision to gather clothes and ambulates into bathroom.  Pt bathes at overall mod I level except assist only to wash back.  Discussed use of long handled sponge to increase independence at home  Pt ambulates back into room for dressing and performs LB dressing mod I, and UB dressing with set up assist.  Pt brushes hair with supervision for standing balance.  Left in recliner with call bell in reach and needs met.   Therapy Documentation Precautions:  Precautions Precautions: Fall Precaution Comments: mild impulsivity Restrictions Weight Bearing Restrictions: No   See Function Navigator for Current Functional Status.   Therapy/Group: Individual Therapy  [name] 12/14/2016, 4:43 PM

## 2016-12-14 NOTE — Progress Notes (Signed)
Social Work Patient ID: Brandon Martin, male   DOB: [birth year], 51 y.o.   MRN: [id number]  Team feels pt is reaching his goals of mod/i and will be ready Thursday for discharge.  Will discuss in team conference tomorrow. Will make discharge arrangements.

## 2016-12-14 NOTE — Progress Notes (Signed)
Occupational Therapy Session Note  Patient Details  Name: Brandon Martin MRN: [id number] Date of Birth: Nov 18, 1965  Today's Date: 12/14/2016 OT Individual Time: 2023-3435 OT Individual Time Calculation (min): 47 min   Short Term Goals: Week 1:  OT Short Term Goal 1 (Week 1): Pt will bathe UB with supervision/ set-up using modified techniques PRN OT Short Term Goal 2 (Week 1): Pt will dress LB with supervision OT Short Term Goal 3 (Week 1): Pt will recall hemi dressing techniques for UB and LB without need for VCs.  OT Short Term Goal 4 (Week 1): Pt will ambulate to gather clothing in prep for showering task with supervision using LRAD  Skilled Therapeutic Interventions/Progress Updates:    OT treatment session focused on L NMR, one-handed grooming tasks, functional use of L UE. Pt completed grooming tasks standing at the sink. Pt able to utilize L UE as a stabilizer to hold toothpaste while R UE removed lid. Pt brushed teeth using R UE while L UE supported on sink ledge. Pt then ambulated to the gym w/o AD and supervision, no LOB. L NMR weight bearing techniques in tall kneeling using B UE's at forearms. Pt then brought into quadruped, but unable to maintain weight bearing distally through L UE without stabilization. Pt then came to sitting at edge of mat and worked on L shoulder external rotation, L forearm pronation and supination. Manual facilitation to elicit normal movement patterns, pt able to achieve forearm supination and shoulder ER with repetition and guided techniques. Pt given super soft tan thera-putty to work on pinch. Pt returned to room and left seated in recliner with needs met.   Therapy Documentation Precautions:  Precautions Precautions: Fall Precaution Comments: mild impulsivity Restrictions Weight Bearing Restrictions: No Pain: Pain Assessment Pain Assessment: 0-10 Pain Score: 4  Pain Type: Acute pain Pain Location: Neck Pain Orientation: Posterior Pain Descriptors /  Indicators: Aching;Sore Pain Intervention(s): Repositioned  See Function Navigator for Current Functional Status.   Therapy/Group: Individual Therapy  Valma Cava 12/14/2016, 11:42 AM

## 2016-12-14 NOTE — Progress Notes (Signed)
Physical Therapy Session Note  Patient Details  Name: Brandon Martin MRN: [id number] Date of Birth: 11-Sep-1965  Today's Date: 12/14/2016 PT Individual Time: 0900-1000 PT Individual Time Calculation (min): 60 min   Short Term Goals: Week 1:  PT Short Term Goal 1 (Week 1): STG= LTG due to ELOS  Skilled Therapeutic Interventions/Progress Updates:    no c/o pain at rest.  Session focus on balance, gait, and NMR.    Pt ambulates throughout unit with no device and supervision, max distance 150'.  Stair negotiation x4 steps with intermittent alternating/step to pattern with R rail and supervision.  PT administered DGI and pt scores 19/24 with scores less than or equal to 19 indicating risk of falls in older adults.  PT interpreted results for pt and made recommendations for safe mobility at d/c to include cane in community environments and f/u with PT for continued progression of balance.  PT re-applied kinesiotape to pt's L knee to reduce pain with positive results, stabilization strip beginning inferior to patella and extending on lateral/medial border of patella as well as decompression strip across inferior border of patella along knee joint line.  Nustep x5 minutes at level 6 with all four extremities for reciprocal stepping pattern retraining, forced use, and activity tolerance.  Pt engaged in table top cup stacking activity focus on functional use of LUE without compensatory shoulder hike.  Pt returned to room at end of session, call bell in reach and needs met.   Therapy Documentation Precautions:  Precautions Precautions: Fall Precaution Comments: mild impulsivity Restrictions Weight Bearing Restrictions: No   See Function Navigator for Current Functional Status.   Therapy/Group: Individual Therapy  Chandrika Sandles E Penven-Crew 12/14/2016, 11:04 AM

## 2016-12-15 ENCOUNTER — Inpatient Hospital Stay (HOSPITAL_COMMUNITY): Payer: Medicare Other | Admitting: Physical Therapy

## 2016-12-15 ENCOUNTER — Inpatient Hospital Stay (HOSPITAL_COMMUNITY): Payer: Medicare Other | Admitting: Occupational Therapy

## 2016-12-15 MED ORDER — TIZANIDINE HCL 2 MG PO TABS
2.0000 mg | ORAL_TABLET | Freq: Three times a day (TID) | ORAL | Status: DC
  Administered 2016-12-15 – 2016-12-16 (×4): 2 mg via ORAL
  Filled 2016-12-15 (×4): qty 1

## 2016-12-15 NOTE — Progress Notes (Signed)
[place] PHYSICAL MEDICINE & REHABILITATION     PROGRESS NOTE    Subjective/Complaints: Left side neck and arm intermittent tightness and discomfort.  No other issues , moving LUE better  ROS: pt denies nausea, vomiting, diarrhea, cough, shortness of breath or chest pain    Objective: Vital Signs: Blood pressure (!) 152/79, pulse 88, temperature 97.7 F (36.5 C), temperature source Oral, resp. rate 18, height 5\' 8"  (1.727 m), weight 90.6 kg (199 lb 11.8 oz), SpO2 100 %. No results found. No results for input(s): WBC, HGB, HCT, PLT in the last 72 hours.  Recent Labs  12/14/16 0438  CREATININE 1.04   CBG (last 3)  No results for input(s): GLUCAP in the last 72 hours.  Wt Readings from Last 3 Encounters:  12/08/16 90.6 kg (199 lb 11.8 oz)  12/05/16 92.6 kg (204 lb 3.2 oz)  11/17/12 93.3 kg (205 lb 9.6 oz)    Physical Exam:  HENT:  Dentition fair Eyes: PERRL Neck: Normal range of motion. Neck supple. No thyromegalypresent.  Cardiovascular: RRR Respiratory: CTA B.  GI: Soft. Bowel sounds are normal. He exhibits no distension.  Skin. Warm and dry Musculoskeletal: Crepitus left knee.no shoulder pain with AROM Neurological: He is alertand oriented to person, place, and time. Coordinationabnormal.  Alert. Left central 7 present with dysarthric speech. LUE: 3-/5 deltoid, 3+/5 biceps, triceps, and 3/5 wrist/1-2 hand. LLE:  4/5 HF, KE and ADF/PF. No sensory deficits- Using substitution pattern Psych: pleasant  Assessment/Plan: 1. Functional and mobility deficits secondary to right MCA infarct which require 3+ hours per day of interdisciplinary therapy in a comprehensive inpatient rehab setting. Physiatrist is providing close team supervision and 24 hour management of active medical problems listed below. Physiatrist and rehab team continue to assess barriers to discharge/monitor patient progress toward functional and medical goals.  Function:  Bathing Bathing position    Position: Shower  Bathing parts Body parts bathed by patient: Left arm, Right upper leg, Left upper leg, Chest, Abdomen, Right lower leg, Front perineal area, Left lower leg, Right arm, Buttocks Body parts bathed by helper: Back  Bathing assist Assist Level: Touching or steadying assistance(Pt > 75%)      Upper Body Dressing/Undressing Upper body dressing   What is the patient wearing?: Pull over shirt/dress     Pull over shirt/dress - Perfomed by patient: Thread/unthread right sleeve, Thread/unthread left sleeve, Put head through opening, Pull shirt over trunk Pull over shirt/dress - Perfomed by helper: Thread/unthread right sleeve, Put head through opening, Pull shirt over trunk        Upper body assist Assist Level: Set up      Lower Body Dressing/Undressing Lower body dressing   What is the patient wearing?: Underwear, Pants Underwear - Performed by patient: Thread/unthread right underwear leg, Thread/unthread left underwear leg, Pull underwear up/down Underwear - Performed by helper: Pull underwear up/down Pants- Performed by patient: Thread/unthread right pants leg, Thread/unthread left pants leg, Pull pants up/down Pants- Performed by helper: Pull pants up/down     Socks - Performed by patient: Don/doff right sock, Don/doff left sock   Shoes - Performed by patient: Don/doff right shoe, Don/doff left shoe       TED Hose - Performed by patient: Don/doff left TED hose, Don/doff right TED hose TED Hose - Performed by helper: Don/doff right TED hose, Don/doff left TED hose  Lower body assist Assist for lower body dressing: More than reasonable time      01/17/13  steps completed by patient: Adjust clothing prior to toileting, Performs perineal hygiene, Adjust clothing after toileting Toileting steps completed by helper: Adjust clothing prior to toileting, Adjust clothing after toileting (per Linden Dolin, NT partial assist with clothing)     Toileting assist Assist level: Touching or steadying assistance (Pt.75%)   Transfers Chair/bed transfer   Chair/bed transfer method: Ambulatory Chair/bed transfer assist level: Supervision or verbal cues Chair/bed transfer assistive device:  (no device)     Locomotion Ambulation     Max distance: 150 Assist level: Supervision or verbal cues   Wheelchair   Type: Manual Max wheelchair distance: 157f  Assist Level: Touching or steadying assistance (Pt > 75%)  Cognition Comprehension Comprehension assist level: Understands complex 90% of the time/cues 10% of the time  Expression Expression assist level: Expresses complex ideas: With extra time/assistive device  Social Interaction Social Interaction assist level: Interacts appropriately with others with medication or extra time (anti-anxiety, antidepressant).  Problem Solving Problem solving assist level: Solves complex problems: With extra time  Memory Memory assist level: More than reasonable amount of time   Medical Problem List and Plan: 1. Left hemiparesis with facial droop and slurred speechsecondary to right MCA/lenticulostriate region infarct -continue PT,OT, SLP-Team conference today please see physician documentation under team conference tab, met with team face-to-face to discuss problems,progress, and goals. Formulized individual treatment plan based on medical history, underlying problem and comorbidities. 2. DVT Prophylaxis/Anticoagulation: Subcutaneous Lovenox. Monitor platelet counts and any signs of bleeding 3. Pain Management/OA left knee:  -voltaren,ice to left knee has been helpful  -consider bracing if needed for support  -tramadol prn Left trap tightness and occ LUE tightness order tizanidine 4. Mood: Provide emotional support 5. Neuropsych: This patient iscapable of making decisions on hisown behalf. 6. Skin/Wound Care: Routine skin checks 7. Fluids/Electrolytes/Nutrition: good po intake     8.Hypertension. Norvasc increased to '10mg'$  daily for better control,improving, anticipate going home on current meds Vitals:   12/14/16 0407 12/15/16 0448  BP: (!) 137/91 (!) 152/79  Pulse: 86 88  Resp: 18 18  Temp: 97.8 F (36.6 C) 97.7 F (36.5 C)   9.History of CAD with CABG. No chest pain or shortness of breath. Continue Plavix 10.Tobacco abuse. Counseling 11.Hyperlipidemia. Lipitor 12.PUD/GI. Pepcid daily     LOS (Days) 8 A FACE TO FACE EVALUATION WAS PERFORMED  KCharlett Blake MD 12/15/2016 7:58 AM

## 2016-12-15 NOTE — Discharge Summary (Signed)
NAMESEARCY, Brandon                  ACCOUNT NO.:  [id number]  MEDICAL RECORD NO.:  [id number]  LOCATION:                                 FACILITY:  PHYSICIAN:  Charlett Blake, M.D.   DATE OF BIRTH:  DATE OF ADMISSION:  12/07/2016 DATE OF DISCHARGE:  12/16/2016                              DISCHARGE SUMMARY   DISCHARGE DIAGNOSES: 1. Right middle cerebral artery territory infarct. 2. Subcutaneous Lovenox for deep venous thrombosis prophylaxis. 3. Pain management. 4. Hypertension. 5. History of coronary artery disease with coronary artery bypass     graft. 6. Tobacco abuse. 7. Hyperlipidemia. 8. Peptic ulcer disease.  HISTORY OF PRESENT ILLNESS:  This is a 51 year old right-handed male, history of CAD with bypass grafting, hypertension, tobacco abuse.  Lives with brother independent prior to admission.  Admitted Dec 04, 2016, with left upper extremity weakness, facial droop, slurred speech.  CT- MRI showed acute right MCA territory infarct, nonhemorrhagic chronic right basal ganglia, lacunar infarction.  MRA negative.  The patient did not receive tPA.  Echocardiogram with ejection fraction of 09% grade 1 diastolic dysfunction.  Carotid Dopplers, no ICA stenosis.  Neurology consulted, placed on Plavix.  The patient was admitted for comprehensive rehab program.  PAST MEDICAL HISTORY:  See discharge diagnoses.  SOCIAL HISTORY:  Lives with brother independent prior to admission.  FUNCTIONAL STATUS UPON ADMISSION TO REHAB SERVICES:  Moderate assist 20 feet, one-person handheld assistance; minimal assist, sit to stand; min- to-mod assist, activities of daily living.  PHYSICAL EXAMINATION:  VITAL SIGNS:  Blood pressure 157/91, pulse 79, temperature 97, and respirations 17. GENERAL:  This was an alert male, in no acute distress. HEENT:  Mild facial droop.  EOMs intact. NECK:  Supple.  Nontender.  No JVD. CARDIAC:  Regular rate and rhythm.  No murmur. ABDOMEN:  Soft, nontender.   Good bowel sounds. LUNGS:  Clear to auscultation without wheeze.  REHABILITATION HOSPITAL COURSE:  The patient was admitted to inpatient rehab services with therapies initiated on a 3-hour daily basis, consisting of physical therapy, occupational therapy, and rehabilitation nursing.  The following issues were addressed during the patient's rehabilitation stay.  Pertaining to Mr. Downum right MCA infarct remained stable, maintained on Plavix therapy.  He would follow up with Neurology Services.  Subcutaneous Lovenox for DVT prophylaxis.  No bleeding episodes.  Pain management with noted osteoarthritis of the left knee.  He was on Voltaren gel, tramadol as needed, as well as low- dose Zanaflex.  Blood pressure is well controlled on Norvasc 10 mg daily.  He did have a history of tobacco abuse.  He was weaned from a NicoDerm patch.  He received full counsel regard to cessation of nicotine products.  No chest pain or shortness of breath with noted history of CAD and bypass grafting.  The patient received weekly collaborative interdisciplinary team conferences to discuss estimated length of stay, family teaching, any barriers to his discharge. Ambulated throughout his room, distance supervision.  He could gather his belongings for activities of daily living and homemaking, modified independent level except needing some assistance to wash his back.  He could ambulate back into his room for dressing,  perform upper and lower body dressing.  It was discussed no driving.  Full family teaching was completed and plan discharge to home.  DISCHARGE MEDICATIONS: 1. Norvasc 10 mg daily. 2. Lipitor 20 mg p.o. daily. 3. Plavix 75 mg p.o. daily. 4. Voltaren gel 3 times daily to affected area. 5. Pepcid 20 mg daily. 6. Nicoderm patch taper as directed. 7. Zanaflex 2 mg t.i.d. 8. Ultram 50 mg at bedtime as needed.  DIET:  His diet was regular.  FOLLOWUP:  The patient would follow up with Dr. Alysia Penna at the outpatient rehab center as directed; Dr. Antony Contras, Neurology Services, call for appointment; Dr. Orpah Melter, medical management.  SPECIAL INSTRUCTIONS:  No driving.     Lauraine Rinne, P.A.   ______________________________ Charlett Blake, M.D.    DA/MEDQ  D:  12/15/2016  T:  12/15/2016  Job:  [id number]  cc:   Orpah Melter, M.D. Pramod P. Leonie Man, MD Charlett Blake, M.D.

## 2016-12-15 NOTE — Progress Notes (Signed)
Occupational Therapy Session Note  Patient Details  Name: Brandon Martin MRN: [id number] Date of Birth: 05-Jan-1966  Today's Date: 12/15/2016 OT Individual Time: 1030-1100 OT Individual Time Calculation (min): 30 min    Short Term Goals: Week 1:  OT Short Term Goal 1 (Week 1): Pt will bathe UB with supervision/ set-up using modified techniques PRN OT Short Term Goal 2 (Week 1): Pt will dress LB with supervision OT Short Term Goal 3 (Week 1): Pt will recall hemi dressing techniques for UB and LB without need for VCs.  OT Short Term Goal 4 (Week 1): Pt will ambulate to gather clothing in prep for showering task with supervision using LRAD  Skilled Therapeutic Interventions/Progress Updates:    Treatment session with focus on LUE NMR.  Pt ambulated to therapy gym Mod I without AD.  Engaged in reaching task with LUE with focus on fine and gross motor control.  NMES applied to forearm to target finger extension.  Ratio 1:1 Rate 35 pps Waveform- Asymmetric Ramp 1.0 Pulse 300 Intensity- 22 Duration -   10 min  No adverse reactions after treatment and is skin intact.    Therapy Documentation Precautions:  Precautions Precautions: Fall Precaution Comments: L hemiparesis UE>LE Restrictions Weight Bearing Restrictions: No General:   Vital Signs: Therapy Vitals Temp: 98.2 F (36.8 C) Temp Source: Oral Pulse Rate: 98 Resp: 20 BP: (!) 144/81 Patient Position (if appropriate): Sitting Oxygen Therapy SpO2: 96 % O2 Device: Not Delivered Pain:  Pt with no c/o pain ADL: ADL Eating: Modified independent Grooming: Modified independent Upper Body Bathing: Modified independent Where Assessed-Upper Body Bathing: Shower Lower Body Bathing: Modified independent Where Assessed-Lower Body Bathing: Shower Upper Body Dressing: Independent Where Assessed-Upper Body Dressing: Edge of bed Lower Body Dressing: Modified independent Where Assessed-Lower Body Dressing: Edge of bed Toileting:  Modified independent Where Assessed-Toileting: Glass blower/designer: Modified Programmer, applications Method: Ambulating Tub/Shower Transfer: Modified independent Tub/Shower Transfer Method: Optometrist: Shower seat with back ADL Comments: Please see functional navigator  See Function Navigator for Current Functional Status.   Therapy/Group: Individual Therapy  Simonne Come 12/15/2016, 3:38 PM

## 2016-12-15 NOTE — Discharge Instructions (Signed)
Inpatient Rehab Discharge Instructions  [place] Discharge date and time: No discharge date for patient encounter.   Activities/Precautions/ Functional Status: Activity: activity as tolerated Diet: regular diet Wound Care: none needed Functional status:  ___ No restrictions     ___ Walk up steps independently ___ 24/7 supervision/assistance   ___ Walk up steps with assistance ___ Intermittent supervision/assistance  ___ Bathe/dress independently ___ Walk with walker     _x__ Bathe/dress with assistance ___ Walk Independently    ___ Shower independently ___ Walk with assistance    ___ Shower with assistance ___ No alcohol     ___ Return to work/school ________  Special Instructions:  No driving no smoking   COMMUNITY REFERRALS UPON DISCHARGE:    Outpatient: PT & OT  Agency:[facility] Phone:[phone]   Date of Last Service:12/16/2016  Appointment Date/Time:6/18- Monday 9:00-11:00 AM  Medical Equipment/Items Order: none has cane at home   GENERAL COMMUNITY RESOURCES FOR PATIENT/FAMILY: Support Groups:[facility] [place] Thursday (SEPT-MAY) @ 3:00-4:00 PM ON THE REHAB UNIT QUESTIONS CONTACT [place] [phone]  STROKE/TIA DISCHARGE INSTRUCTIONS SMOKING Cigarette smoking nearly doubles your risk of having a stroke & is the single most alterable risk factor  If you smoke or have smoked in the last 12 months, you are advised to quit smoking for your health.  Most of the excess cardiovascular risk related to smoking disappears within a year of stopping.  Ask you doctor about anti-smoking medications  [place] Quit Line: 1-800-QUIT NOW  Free Smoking Cessation Classes [phone]  CHOLESTEROL Know your levels; limit fat & cholesterol in your diet  Lipid Panel     Component Value Date/Time   CHOL 220 (H) 12/05/2016 0138   TRIG 281 (H) 12/05/2016 0138   HDL 40 (L) 12/05/2016 0138   CHOLHDL 5.5 12/05/2016 0138   VLDL 56 (H) 12/05/2016 0138   LDLCALC 124 (H) 12/05/2016 0138      Many patients benefit from treatment even if their cholesterol is at goal.  Goal: Total Cholesterol (CHOL) less than 160  Goal:  Triglycerides (TRIG) less than 150  Goal:  HDL greater than 40  Goal:  LDL (LDLCALC) less than 100   BLOOD PRESSURE American Stroke Association blood pressure target is less that 120/80 mm/Hg  Your discharge blood pressure is:  BP: (!) 137/91  Monitor your blood pressure  Limit your salt and alcohol intake  Many individuals will require more than one medication for high blood pressure  DIABETES (A1c is a blood sugar average for last 3 months) Goal HGBA1c is under 7% (HBGA1c is blood sugar average for last 3 months)  Diabetes: No known diagnosis of diabetes    Lab Results  Component Value Date   HGBA1C 6.0 (H) 12/05/2016     Your HGBA1c can be lowered with medications, healthy diet, and exercise.  Check your blood sugar as directed by your physician  Call your physician if you experience unexplained or low blood sugars.  PHYSICAL ACTIVITY/REHABILITATION Goal is 30 minutes at least 4 days per week  Activity: Increase activity slowly, Therapies: Physical Therapy: Home Health Return to work:   Activity decreases your risk of heart attack and stroke and makes your heart stronger.  It helps control your weight and blood pressure; helps you relax and can improve your mood.  Participate in a regular exercise program.  Talk with your doctor about the best form of exercise for you (dancing, walking, swimming, cycling).  DIET/WEIGHT Goal is to maintain  a healthy weight  Your discharge diet is: Diet regular Room service appropriate? Yes; Fluid consistency: Thin  liquids Your height is:  Height: 5\' 8"  (172.7 cm) Your current weight is: Weight: 90.6 kg (199 lb 11.8 oz) Your Body Mass Index (BMI) is:  BMI (Calculated): 31  Following the type of diet specifically designed for you will help prevent another stroke.  Your  goal weight range is:    Your goal Body Mass Index (BMI) is 19-24.  Healthy food habits can help reduce 3 risk factors for stroke:  High cholesterol, hypertension, and excess weight.  RESOURCES Stroke/Support Group:  Call [phone]   STROKE EDUCATION PROVIDED/REVIEWED AND GIVEN TO PATIENT Stroke warning signs and symptoms How to activate emergency medical system (call 911). Medications prescribed at discharge. Need for follow-up after discharge. Personal risk factors for stroke. Pneumonia vaccine given:  Flu vaccine given:  My questions have been answered, the writing is legible, and I understand these instructions.  I will adhere to these goals & educational materials that have been provided to me after my discharge from the hospital.     My questions have been answered and I understand these instructions. I will adhere to these goals and the provided educational materials after my discharge from the hospital.  Patient/Caregiver Signature _______________________________ Date __________  Clinician Signature _______________________________________ Date __________  Please bring this form and your medication list with you to all your follow-up doctor's appointments.

## 2016-12-15 NOTE — Patient Care Conference (Signed)
Inpatient RehabilitationTeam Conference and Plan of Care Update Date: 12/15/2016   Time: 11:00 AM    Patient Name: Brandon Martin      Medical Record Number: [id number]  Date of Birth: 25-Oct-1965 Sex: Male         Room/Bed: 4M03C/4M03C-01 Payor Info: Payor: Multimedia programmer / Plan: [employer] MEDICARE / Product Type: *No Product type* /    Admitting Diagnosis: R CVA  Admit Date/Time:  12/07/2016  5:20 PM Admission Comments: No comment available   Primary Diagnosis:  <principal problem not specified> Principal Problem: <principal problem not specified>  Patient Active Problem List   Diagnosis Date Noted  . Right basal ganglia embolic stroke (HCC) 12/07/2016  . Stroke (cerebrum) (HCC) 12/04/2016  . Tobacco use disorder   . Coronary artery disease   . GERD (gastroesophageal reflux disease)   . Hyperlipidemia   . Pleural effusion     Expected Discharge Date: Expected Discharge Date: 12/16/16  Team Members Present: Physician leading conference: Dr. Claudette Laws Social Worker Present: Dossie Der, LCSW Nurse Present: Other (comment) Damaris Schooner Troxler-RN) PT Present: Teodoro Kil, PT OT Present: Kearney Hard, OT SLP Present: Feliberto Gottron, SLP PPS Coordinator present : Tora Duck, RN, CRRN     Current Status/Progress Goal Weekly Team Focus  Medical   improving with LUE strength,has Left neck/shoulder spasm  manage pain related to CVA  med management , d/c planning   Bowel/Bladder   continent of B/B; LBM 6/3  maintain continence with min assist  monitor for changes in continence q shift and prn   Swallow/Nutrition/ Hydration             ADL's   Mod I/supervision overall  Mod I/supervision   activity tolerance, L NMR, modified bathing/dressing   Mobility   mod I overall   mod I  d/c planning, balance, community mobility   Communication             Safety/Cognition/ Behavioral Observations            Pain   pain/discomfort to L shoulder---denied pain  medication  <4  assess for pain q shift and prn   Skin   L knee abrasion with pink foam; knuckle/joint of R index finger red  skin free from infection and breakdown  assess skin q shift and prn      *See Care Plan and progress notes for long and short-term goals.  Barriers to Discharge: none except d/c equipment, finalize training    Possible Resolutions to Barriers:  d/c in am    Discharge Planning/Teaching Needs:  Home with Mom and brother's who can provide supervision level. Pt aware DC tomorrow and agreeable to plan      Team Discussion:  Reaching goals of mod/i level and made mod/i in room. Shoulder pain MD prescribed med today see if works. PT has shown him how to tape shoulder. Medically stable for DC tomorrow  Revisions to Treatment Plan:  DC tomorrow   Continued Need for Acute Rehabilitation Level of Care: The patient requires daily medical management by a physician with specialized training in physical medicine and rehabilitation for the following conditions: Daily direction of a multidisciplinary physical rehabilitation program to ensure safe treatment while eliciting the highest outcome that is of practical value to the patient.: Yes Daily medical management of patient stability for increased activity during participation in an intensive rehabilitation regime.: Yes Daily analysis of laboratory values and/or radiology reports with any subsequent need for medication adjustment of medical  intervention for : Neurological problems;Other  Lucy Chris 12/15/2016, 12:29 PM

## 2016-12-15 NOTE — Discharge Summary (Signed)
Discharge summary job [id number]

## 2016-12-15 NOTE — Progress Notes (Signed)
Social Work Patient ID: Brandon Martin, male   DOB: August 15, 1965, 51 y.o.   MRN: [id number]  Met with pt to discuss team conference meeting goals and being ready for discharge tomorrow. He has been made mod/i in his room today to Built his confidence before going home. Feels good about going home tomorrow. Will make OP arrangements.

## 2016-12-15 NOTE — Progress Notes (Signed)
Occupational Therapy Discharge Summary  Patient Details  Name: Brandon Martin MRN: [id number] Date of Birth: 08-18-1965  Today's Date: 12/15/2016  OT Individual Time: 1345-1456 OT Individual Time Calculation (min): 71 min    OT treatment session focused on L UE NMR, e-stim, functional transfers, and therapeutic massage. Pt ambulated to therapy apartment and completed blocked practice stepping in and out of tub in simulated home environment. Pt will have his mothers shower chair to then sit on for showering at home. L UE NMR focused on shoulder/elbow/wrist flex/ext with facilitation to elicit normal movement patterns and minimize exaggerated shoulder hike. 15 mins attended e-stim focused on wrist extension. Therapeutic massage for L UE neck/shoulder pain. Pt left seated at EOB with needs met.   Patient has met 11 of 11 long term goals due to improved activity tolerance, improved balance, postural control, ability to compensate for deficits, functional use of  LEFT upper and LEFT lower extremity, improved awareness and improved coordination.  Patient to discharge at overall Modified Independent level.  Patient's care partner is independent to provide the necessary physical assistance for higher level iADL tasks at discharge.    Reasons goals not met: n/a  Recommendation:  Patient will benefit from ongoing skilled OT services in outpatient setting to continue to advance functional skills in the area of functional use of L UE.  Equipment: No equipment provided  Reasons for discharge: treatment goals met and discharge from hospital  Patient/family agrees with progress made and goals achieved: Yes  OT Discharge Precautions/Restrictions  Precautions Precautions: Fall Precaution Comments: L hemiparesis UE>LE Restrictions Weight Bearing Restrictions: No Pain Pain Assessment Pain Assessment: 0-10 Pain Score: 5  Pain Type: Acute pain Pain Location: Shoulder Pain Orientation: Left Pain  Descriptors / Indicators: Aching Pain Onset: On-going Pain Intervention(s): Repositioned ADL ADL Eating: Modified independent Grooming: Modified independent Upper Body Bathing: Modified independent Where Assessed-Upper Body Bathing: Shower Lower Body Bathing: Modified independent Where Assessed-Lower Body Bathing: Shower Upper Body Dressing: Independent Where Assessed-Upper Body Dressing: Edge of bed Lower Body Dressing: Modified independent Where Assessed-Lower Body Dressing: Edge of bed Toileting: Modified independent Where Assessed-Toileting: Risk analyst Method: Human resources officer: Modified independent Clinical cytogeneticist Method: Patent attorney with back ADL Comments: Please see IT trainer: Within Functional Limits Praxis Praxis: Intact Cognition Overall Cognitive Status: Within Functional Limits for tasks assessed Arousal/Alertness: Awake/alert Orientation Level: Oriented X4 Sensation Sensation Light Touch: Impaired Detail Light Touch Impaired Details: Impaired LUE (improved since admission) Hot/Cold: Appears Intact Coordination Coordination and Movement Description: hemiparesis in LUE but improvedgross motor movement patterns Motor  Motor Motor: Hemiplegia Motor - Discharge Observations: L hemiparesis UE>LE, improving from eval, LUE with improving strength for gross motor movement patterns Mobility  Transfers Sit to Stand: 7: Independent Stand to Sit: 7: Independent  Balance Balance Balance Assessed: Yes Static Sitting Balance Static Sitting - Level of Assistance: 7: Independent Dynamic Sitting Balance Dynamic Sitting - Level of Assistance: 7: Independent Static Standing Balance Static Standing - Level of Assistance: 6: Modified independent (Device/Increase time) Dynamic Standing Balance Dynamic Standing - Level of  Assistance: 6: Modified independent (Device/Increase time) Extremity/Trunk Assessment RUE Assessment RUE Assessment: Within Functional Limits LUE Assessment LUE Assessment: Exceptions to WFL LUE AROM (degrees) Left Elbow Flexion: 120 Left Wrist Extension: 40 Degrees   See Function Navigator for Current Functional Status.  Daneen Schick Antonia Jicha 12/15/2016, 4:34 PM

## 2016-12-15 NOTE — Progress Notes (Signed)
Physical Therapy Discharge Summary  Patient Details  Name: Brandon Martin MRN: [id number] Date of Birth: March 09, 1966  Today's Date: 12/15/2016 PT Individual Time: 0900-1000 and 1130-1200 PT Individual Time Calculation (min): 60 min and 30 min    Patient has met 11 of 11 long term goals due to improved activity tolerance, improved balance, improved postural control, increased strength, decreased pain, functional use of  left upper extremity and left lower extremity, improved attention, improved awareness and improved coordination.  Patient to discharge at an ambulatory level Modified Independent.     Recommendation:  Patient will benefit from ongoing skilled PT services in outpatient setting to continue to advance safe functional mobility, address ongoing impairments in balance, postural control, coordination, and motor control, and minimize fall risk.  Equipment: No equipment provided  Reasons for discharge: treatment goals met and discharge from hospital  Patient/family agrees with progress made and goals achieved: Yes   Skilled PT intervention: Session 1: c/o pain as below.  Session focus on reassessment of balance, strength, coordination, sensation, and functional mobility, d/c planning, falls recovery, and LUE NMR.  Pt currently performing all mobility at a mod I level.  Stair negotiation 2x12 steps with R rail mod I. PT instructed pt in falls recovery, and pt performs floor transfer mod I.  LUE NMR with cup stacking activity 2x12 trials focus on gross grasp and decreased compensatory movement at shoulder.  Nustep x10 minutes (3 minutes without LUE splint for LUE NMR focus on gross movement and grasp) for forced use, activity tolerance, and reciprocal stepping pattern retraining. Pt returned to room at end of session, made mod I in room.    Session 2: no c/o pain, but antalgic with LLE weight bearing during gait.  Session focus on community level ambulation and path finding.  Pt ambulated  >1000' with no AD, outside on uneven surfaces and inclines, negotiated 2x4 steps, on/off elevator with overall mod I.  Pt returned to room at end of session and positioned in recliner with ice to L knee, call bell in reach and needs met.  PT Discharge Precautions/Restrictions Precautions Precautions: Fall Precaution Comments: L hemiparesis UE>LE Restrictions Weight Bearing Restrictions: No Pain Pain Assessment Pain Assessment: 0-10 Pain Score: 5  Pain Type: Chronic pain Pain Location: Knee Pain Orientation: Left Multiple Pain Sites: Yes 2nd Pain Site Pain Score: 5 Pain Type: Chronic pain Pain Location: Shoulder Pain Orientation: Left Pain Intervention(s): Emotional support Vision/Perception  Perception Perception: Within Functional Limits Praxis Praxis: Intact  Cognition Overall Cognitive Status: History of cognitive impairments - at baseline Arousal/Alertness: Awake/alert Orientation Level: Oriented X4 Attention: Selective Sustained Attention: Appears intact Selective Attention: Appears intact Awareness: Appears intact Safety/Judgment: Appears intact Sensation Sensation Light Touch: Appears Intact (reports decreased sensation in L fingertips but intact to LT) Coordination Gross Motor Movements are Fluid and Coordinated: No Fine Motor Movements are Fluid and Coordinated: No Coordination and Movement Description: significant hemiparesis in LUE but improving gross motor movement patterns Motor  Motor Motor: Hemiplegia Motor - Discharge Observations: L hemiparesis UE>LE, improving from eval, LUE with improving strength for gross motor movement patterns  Mobility Bed Mobility Bed Mobility: Supine to Sit;Sit to Supine Supine to Sit: 7: Independent Sit to Supine: 7: Independent Transfers Transfers: Yes Sit to Stand: 7: Independent Stand to Sit: 7: Independent Stand Pivot Transfers: 6: Modified independent (Device/Increase time) Locomotion  Ambulation Ambulation:  Yes Ambulation/Gait Assistance: 6: Modified independent (Device/Increase time) Ambulation Distance (Feet): 300 Feet Assistive device: None Gait Gait: Yes Gait Pattern:  Step-through pattern;Antalgic Stairs / Additional Locomotion Stairs: Yes Stairs Assistance: 6: Modified independent (Device/Increase time) Stair Management Technique: One rail Right Number of Stairs: 24 Ramp: 6: Modified independent (Device) Curb: 6: Modified independent (Device/increase time) Naval architect Mobility: No  Trunk/Postural Assessment  Cervical Assessment Cervical Assessment:  (forward head) Thoracic Assessment Thoracic Assessment: Within Functional Limits (preference for rounded shoulders, but able to correct with cues) Lumbar Assessment Lumbar Assessment: Exceptions to [facility] (posterior pelvic tilt) Postural Control Postural Control: Within Functional Limits  Balance Balance Balance Assessed: Yes Static Sitting Balance Static Sitting - Level of Assistance: 7: Independent Dynamic Sitting Balance Dynamic Sitting - Level of Assistance: 7: Independent Static Standing Balance Static Standing - Level of Assistance: 6: Modified independent (Device/Increase time) Dynamic Standing Balance Dynamic Standing - Level of Assistance: 6: Modified independent (Device/Increase time) Extremity Assessment      RLE Assessment RLE Assessment: Within Functional Limits (5/5 throughout) LLE AROM (degrees) LLE Overall AROM Comments: WFL assessed in sitting LLE Strength LLE Overall Strength Comments: at least 3+/5 throughout, did not test knee 2/2 arthritis pain; hip flexion 4+/5   See Function Navigator for Current Functional Status.  Cayleen Benjamin E Penven-Crew 12/15/2016, 9:26 AM

## 2016-12-16 MED ORDER — TRAMADOL HCL 50 MG PO TABS: 50.0000 mg | ORAL_TABLET | Freq: Four times a day (QID) | ORAL | 0 refills | Status: DC | PRN

## 2016-12-16 MED ORDER — TIZANIDINE HCL 2 MG PO TABS: 2.0000 mg | ORAL_TABLET | Freq: Three times a day (TID) | ORAL | 0 refills | Status: DC

## 2016-12-16 MED ORDER — OMEPRAZOLE 20 MG PO CPDR: 20.0000 mg | DELAYED_RELEASE_CAPSULE | Freq: Every day | ORAL | 1 refills | Status: DC

## 2016-12-16 MED ORDER — CLOPIDOGREL BISULFATE 75 MG PO TABS: 75.0000 mg | ORAL_TABLET | Freq: Every day | ORAL | 1 refills | Status: DC

## 2016-12-16 MED ORDER — DICLOFENAC SODIUM 1 % TD GEL: 2.0000 g | Freq: Three times a day (TID) | TRANSDERMAL | 0 refills | Status: DC

## 2016-12-16 MED ORDER — ATORVASTATIN CALCIUM 20 MG PO TABS: 20.0000 mg | ORAL_TABLET | Freq: Every day | ORAL | 1 refills | Status: DC

## 2016-12-16 MED ORDER — AMLODIPINE BESYLATE 10 MG PO TABS: 10.0000 mg | ORAL_TABLET | Freq: Every day | ORAL | 1 refills | Status: AC

## 2016-12-16 MED ORDER — FAMOTIDINE 20 MG PO TABS: 20.0000 mg | ORAL_TABLET | Freq: Every day | ORAL | 0 refills | Status: DC

## 2016-12-16 MED ORDER — NICOTINE 14 MG/24HR TD PT24: MEDICATED_PATCH | TRANSDERMAL | 0 refills | Status: DC

## 2016-12-16 NOTE — Progress Notes (Signed)
Social Work  Discharge Note  The overall goal for the admission was met for:   Discharge location: Yes-HOME WITH MOM, BROTHER'S AND SISTER IN[place]  Length of Stay: Yes-9 DAYS  Discharge activity level: Yes-MOD/I LEVEL  Home/community participation: Yes  Services provided included: MD, RD, PT, OT, SLP, RN, CM, TR, Pharmacy and SW  Financial Services: Private Insurance: [employer]  Follow-up services arranged: Outpatient: [facility] JUNE 18 9:00-11:00 AM  Comments (or additional information):PT DID WELL AND REACHED MOD/I LEVEL READY TO GO HOME WITH FAMILY WHO CAN PROVIDE SUPERVISION LEVEL  Patient/Family verbalized understanding of follow-up arrangements: Yes  Individual responsible for coordination of the follow-up plan: SELF & SHAWN-BROTHER  Confirmed correct DME delivered: Brandon Martin, Brandon Martin 12/16/2016    Brandon Martin, Brandon Martin 

## 2016-12-16 NOTE — Progress Notes (Signed)
[facility]     PROGRESS NOTE    Subjective/Complaints: Some left sided chest and shoulder pain this am.  Increased with deep breath, no SOB, no diaphresis.  Feels sore from therapy  ROS: pt denies nausea, vomiting, diarrhea, cough, shortness of breath    Objective: Vital Signs: Blood pressure (!) 143/87, pulse 96, temperature 97.7 F (36.5 C), temperature source Oral, resp. rate 20, height 5\' 8"  (1.727 m), weight 87.1 kg (192 lb 0.3 oz), SpO2 92 %. No results found. No results for input(s): WBC, HGB, HCT, PLT in the last 72 hours.  Recent Labs  12/14/16 0438  CREATININE 1.04   CBG (last 3)  No results for input(s): GLUCAP in the last 72 hours.  Wt Readings from Last 3 Encounters:  12/15/16 87.1 kg (192 lb 0.3 oz)  12/05/16 92.6 kg (204 lb 3.2 oz)  11/17/12 93.3 kg (205 lb 9.6 oz)    Physical Exam:  HENT:  Dentition fair Eyes: PERRL Neck: Normal range of motion. Neck supple. No thyromegalypresent.  Cardiovascular: RRR Respiratory: CTA B.  GI: Soft. Bowel sounds are normal. He exhibits no distension.  Skin. Warm and dry Musculoskeletal: Reproducible tenderness over the Left pectoralis Neurological: He is alertand oriented to person, place, and time. Coordinationabnormal.  Alert. Left central 7 present with dysarthric speech. LUE: 3-/5 deltoid, 3+/5 biceps, triceps, and 3/5 wrist/1-2 hand. LLE:  4/5 HF, KE and ADF/PF. No sensory deficits- Using substitution pattern Psych: pleasant  Assessment/Plan: 1. Functional and mobility deficits secondary to right MCA infarct Stable for D/C today F/u PCP in 3-4 weeks F/u PM&R 2 weeks See D/C summary See D/C instructions Function:  Bathing Bathing position   Position: Shower  Bathing parts Body parts bathed by patient: Right arm, Left arm, Chest, Abdomen, Front perineal area, Buttocks, Right upper leg, Left upper leg, Right lower leg, Left lower leg Body parts bathed by helper: Back   Bathing assist Assist Level: More than reasonable time      Upper Body Dressing/Undressing Upper body dressing   What is the patient wearing?: Pull over shirt/dress     Pull over shirt/dress - Perfomed by patient: Thread/unthread left sleeve, Thread/unthread right sleeve, Put head through opening, Pull shirt over trunk Pull over shirt/dress - Perfomed by helper: Thread/unthread right sleeve, Put head through opening, Pull shirt over trunk        Upper body assist Assist Level: More than reasonable time      Lower Body Dressing/Undressing Lower body dressing   What is the patient wearing?: Underwear, Pants, [employer], Socks, Software engineer - Performed by patient: Pull underwear up/down, Thread/unthread left underwear leg, Thread/unthread right underwear leg Underwear - Performed by helper: Pull underwear up/down Pants- Performed by patient: Thread/unthread right pants leg, Pull pants up/down, Thread/unthread left pants leg Pants- Performed by helper: Pull pants up/down     Socks - Performed by patient: Don/doff right sock, Don/doff left sock   Shoes - Performed by patient: Don/doff right shoe, Don/doff left shoe       TED Hose - Performed by patient: Don/doff right TED hose, Don/doff left TED hose TED Hose - Performed by helper: Don/doff right TED hose, Don/doff left TED hose  Lower body assist Assist for lower body dressing: More than reasonable time      Toileting Toileting   Toileting steps completed by patient: Performs perineal hygiene, Adjust clothing prior to toileting, Adjust clothing after toileting Toileting steps completed by helper: Adjust clothing prior  to toileting, Adjust clothing after toileting (per Linden Dolin, NT partial assist with clothing)    Toileting assist Assist level: No help/no cues   Transfers Chair/bed transfer   Chair/bed transfer method: Ambulatory Chair/bed transfer assist level: No Help, no cues, assistive device, takes more than  a reasonable amount of time Chair/bed transfer assistive device:  (no device)     Locomotion Ambulation     Max distance: 300 Assist level: No help, No cues, assistive device, takes more than a reasonable amount of time   Wheelchair   Type: Manual Max wheelchair distance: 146ft  Assist Level: Touching or steadying assistance (Pt > 75%)  Cognition Comprehension Comprehension assist level: Understands complex 90% of the time/cues 10% of the time  Expression Expression assist level: Expresses complex ideas: With extra time/assistive device  Social Interaction Social Interaction assist level: Interacts appropriately with others with medication or extra time (anti-anxiety, antidepressant).  Problem Solving Problem solving assist level: Solves complex problems: With extra time  Memory Memory assist level: More than reasonable amount of time   Medical Problem List and Plan: 1. Left hemiparesis with facial droop and slurred speechsecondary to right MCA/lenticulostriate region infarct -D/C today -2. DVT Prophylaxis/Anticoagulation: Subcutaneous Lovenox. Monitor platelet counts and any signs of bleeding 3. Pain Management/OA left knee:  -voltaren,ice to left knee has been helpful  -consider bracing if needed for support  -tramadol prn Left trap tightness and occ LUE tightness order tizanidine 4. Mood: Provide emotional support 5. Neuropsych: This patient iscapable of making decisions on hisown behalf. 6. Skin/Wound Care: Routine skin checks 7. Fluids/Electrolytes/Nutrition: good po intake    8.Hypertension. Norvasc increased to 10mg  daily for better control,improving, anticipate going home on current meds Vitals:   12/15/16 2048 12/16/16 0500  BP: 136/89 (!) 143/87  Pulse: 84 96  Resp: 20 20  Temp:  97.7 F (36.5 C)   9.History of CAD with CABG. No chest pain or shortness of breath. Continue Plavix 10.Tobacco abuse. Counseling 11.Hyperlipidemia.  Lipitor 12.PUD/GI. Pepcid daily   13.  CP MSK, tender over Left pectoralis, should improve may use heat/ice, muscle cream  LOS (Days) 9 A FACE TO FACE EVALUATION WAS PERFORMED  Charlett Blake, MD 12/16/2016 7:16 AM

## 2016-12-16 NOTE — Progress Notes (Signed)
Pt. Got d/c papers and follow up appointments,pt. Ready to go home with his brother.

## 2016-12-27 ENCOUNTER — Ambulatory Visit: Payer: Medicare Other | Admitting: Physical Therapy

## 2016-12-27 ENCOUNTER — Ambulatory Visit: Payer: Medicare Other | Admitting: *Deleted

## 2016-12-28 ENCOUNTER — Ambulatory Visit: Payer: Medicare Other | Admitting: Physical Therapy

## 2016-12-28 ENCOUNTER — Encounter: Payer: Self-pay | Admitting: Physical Therapy

## 2016-12-28 ENCOUNTER — Encounter: Payer: Self-pay | Admitting: Occupational Therapy

## 2016-12-28 ENCOUNTER — Ambulatory Visit: Payer: Medicare Other | Attending: Physical Medicine & Rehabilitation | Admitting: Occupational Therapy

## 2016-12-28 VITALS — BP 148/93

## 2016-12-28 DIAGNOSIS — M6281 Muscle weakness (generalized): Secondary | ICD-10-CM | POA: Diagnosis present

## 2016-12-28 DIAGNOSIS — M25562 Pain in left knee: Secondary | ICD-10-CM

## 2016-12-28 DIAGNOSIS — R208 Other disturbances of skin sensation: Secondary | ICD-10-CM | POA: Insufficient documentation

## 2016-12-28 DIAGNOSIS — I69354 Hemiplegia and hemiparesis following cerebral infarction affecting left non-dominant side: Secondary | ICD-10-CM | POA: Diagnosis not present

## 2016-12-28 DIAGNOSIS — M25512 Pain in left shoulder: Secondary | ICD-10-CM | POA: Insufficient documentation

## 2016-12-28 DIAGNOSIS — R2681 Unsteadiness on feet: Secondary | ICD-10-CM

## 2016-12-28 NOTE — Patient Instructions (Signed)
Bed positioning for LUE to reduce pain. Pt able to verbalize understanding.

## 2016-12-28 NOTE — Therapy (Signed)
Clinical Presentation due to: knee pain remains significant factor in his recovery and as of yet has not been x-rayed; BP control is not yet stabilized/ideal   Clinical Decision Making Moderate   Rehab Potential Good   Clinical Impairments Affecting Rehab Potential severe lt patella pain   PT Frequency 2x / week   PT Duration 8 weeks   PT Treatment/Interventions ADLs/Self Care Home Management;DME Instruction;Gait training;Stair training;Functional mobility training;Therapeutic activities;Therapeutic exercise;Balance training;Neuromuscular re-education;Patient/family education;Orthotic Fit/Training;Manual techniques;Energy conservation;Taping   PT Next Visit Plan re-assess lt knee pain and  if remains severe, ask pt if he would be willing to have it xrayed if MD will order x-ray (I didn't want to pursue without knowing if he would go for x-ray); educte in fall prevention; initiate HEP for LLE strengthening and balance;    Consulted and Agree with Plan of Care Patient      Patient will benefit from skilled therapeutic intervention in order to improve the following deficits and impairments:  Abnormal gait, Cardiopulmonary status limiting activity, Decreased balance, Decreased knowledge of use of DME, Decreased mobility, Decreased strength, Difficulty walking, Impaired sensation, Pain  Visit Diagnosis: Muscle weakness (generalized) - Plan: PT plan of care cert/re-cert  Unsteadiness on feet - Plan: PT plan of care cert/re-cert  Other disturbances of skin sensation - Plan: PT plan of care cert/re-cert  Acute pain of left knee - Plan: PT plan of care cert/re-cert  Hemiplegia and hemiparesis following cerebral infarction affecting left non-dominant side ([place]) - Plan: PT plan of care cert/re-cert      G-Codes - 38/10/17 1845    Functional Assessment Tool Used (Outpatient Only) Gait velocity 2.8 ft/sec (norm for males 50-59 is 3.07 ft/sec)   Functional Limitation Mobility: Walking and moving around   Mobility: Walking and Moving Around Current Status (P1025) At least 20 percent but less than 40 percent impaired, limited or restricted   Mobility: Walking and Moving Around Goal Status [phone]) At least 1 percent but less than 20 percent impaired, limited or restricted       Problem List Patient Active Problem List   Diagnosis Date Noted  . Right basal ganglia embolic stroke ([place]) 82/42/3536  . Stroke (cerebrum) ([place]) 12/04/2016  . Tobacco use disorder   . Coronary artery disease   . GERD (gastroesophageal reflux disease)   . Hyperlipidemia   . Pleural effusion     Rexanne Mano, PT 12/28/2016, 7:21 PM  [facility] [address] [place], [place], [address] Phone: [phone]   Fax:  [phone]  Name: Brandon Martin MRN: [id number] Date of Birth: [birth year]  Clinical Presentation due to: knee pain remains significant factor in his recovery and as of yet has not been x-rayed; BP control is not yet stabilized/ideal   Clinical Decision Making Moderate   Rehab Potential Good   Clinical Impairments Affecting Rehab Potential severe lt patella pain   PT Frequency 2x / week   PT Duration 8 weeks   PT Treatment/Interventions ADLs/Self Care Home Management;DME Instruction;Gait training;Stair training;Functional mobility training;Therapeutic activities;Therapeutic exercise;Balance training;Neuromuscular re-education;Patient/family education;Orthotic Fit/Training;Manual techniques;Energy conservation;Taping   PT Next Visit Plan re-assess lt knee pain and  if remains severe, ask pt if he would be willing to have it xrayed if MD will order x-ray (I didn't want to pursue without knowing if he would go for x-ray); educte in fall prevention; initiate HEP for LLE strengthening and balance;    Consulted and Agree with Plan of Care Patient      Patient will benefit from skilled therapeutic intervention in order to improve the following deficits and impairments:  Abnormal gait, Cardiopulmonary status limiting activity, Decreased balance, Decreased knowledge of use of DME, Decreased mobility, Decreased strength, Difficulty walking, Impaired sensation, Pain  Visit Diagnosis: Muscle weakness (generalized) - Plan: PT plan of care cert/re-cert  Unsteadiness on feet - Plan: PT plan of care cert/re-cert  Other disturbances of skin sensation - Plan: PT plan of care cert/re-cert  Acute pain of left knee - Plan: PT plan of care cert/re-cert  Hemiplegia and hemiparesis following cerebral infarction affecting left non-dominant side ([place]) - Plan: PT plan of care cert/re-cert      G-Codes - 38/10/17 1845    Functional Assessment Tool Used (Outpatient Only) Gait velocity 2.8 ft/sec (norm for males 50-59 is 3.07 ft/sec)   Functional Limitation Mobility: Walking and moving around   Mobility: Walking and Moving Around Current Status (P1025) At least 20 percent but less than 40 percent impaired, limited or restricted   Mobility: Walking and Moving Around Goal Status [phone]) At least 1 percent but less than 20 percent impaired, limited or restricted       Problem List Patient Active Problem List   Diagnosis Date Noted  . Right basal ganglia embolic stroke ([place]) 82/42/3536  . Stroke (cerebrum) ([place]) 12/04/2016  . Tobacco use disorder   . Coronary artery disease   . GERD (gastroesophageal reflux disease)   . Hyperlipidemia   . Pleural effusion     Rexanne Mano, PT 12/28/2016, 7:21 PM  [facility] [address] [place], [place], [address] Phone: [phone]   Fax:  [phone]  Name: Brandon Martin MRN: [id number] Date of Birth: [birth year]  [facility] [address] [place], [place], [address] Phone: [phone]   Fax:  [phone]  Physical Therapy Evaluation  Patient Details  Name: Brandon Martin MRN: [id number] Date of Birth: [birth year] Referring Provider: Alysia Penna  Encounter Date: 12/28/2016      PT End of Session - 12/28/16 1311    Visit Number 1   Number of Visits 17   Date for PT Re-Evaluation 02/25/17   Authorization Type UHC Medicare   Authorization Time Period 12/28/16 to 02/25/17   PT Start Time 0900  pt had to use restroom before eval   PT Stop Time 0933   PT Time Calculation (min) 33 min   Activity Tolerance Other (comment)  PT limited some testing due to severe knee pain   Behavior During Therapy [facility] for tasks assessed/performed      Past Medical History:  Diagnosis Date  . Coronary artery disease   . GERD (gastroesophageal reflux disease)   . Hyperlipidemia   . Hypertension   . Pleural effusion   . Tobacco use disorder     Past Surgical History:  Procedure Laterality Date  . CORONARY ARTERY BYPASS GRAFT  x 3    Vitals:   12/28/16 0902  BP: (!) 148/93 Discussed general goal level of <130/80         Subjective Assessment - 12/28/16 0905    Subjective Reports fell walking into hospital (when he had stroke) and landed on curb on lt knee. Has had severe pain 7-10/10. [place] again when at home (stepped back from grill and tripped over brick) hit left knee again.    Pertinent History Lt knee 8 surgeries due to crush injury    Currently in Pain? Yes   Pain Score 7    Pain Location Knee   Pain Orientation Left   Pain Descriptors / Indicators Sharp;Stabbing   Pain Type Acute pain   Pain Onset More than a month ago   Pain Frequency Intermittent   Pain Relieving Factors taping, ice, pain meds            [facility] PT Assessment - 12/28/16 0850      Assessment   Medical Diagnosis Rt MCA CVA   Referring Provider  Alysia Penna   Onset Date/Surgical Date 12/04/16   Prior Therapy CIR     Precautions   Precautions Fall     Balance Screen   Has the patient fallen in the past 6 months Yes   How many times? 2   Has the patient had a decrease in activity level because of a fear of falling?  No   Is the patient reluctant to leave their home because of a fear of falling?  No     Home Ecologist residence   Transport planner;Other relatives  brother   Available Help at Discharge Family;Available 24 hours/day   Type of Home House   Home Access Stairs to enter   Entrance Stairs-Number of Steps 2   Entrance Stairs-Rails None   Home Layout One level   Home Equipment Shower seat     Prior Function   Level of Independence Independent   Vocation On disability   Leisure go fishing     Cognition   Overall Cognitive Status Within Functional Limits for tasks assessed     Observation/Other Assessments   Focus on Therapeutic Outcomes (FOTO)  Functional Status score 58 with risk adjusted 51   Lower Extremity  Clinical Presentation due to: knee pain remains significant factor in his recovery and as of yet has not been x-rayed; BP control is not yet stabilized/ideal   Clinical Decision Making Moderate   Rehab Potential Good   Clinical Impairments Affecting Rehab Potential severe lt patella pain   PT Frequency 2x / week   PT Duration 8 weeks   PT Treatment/Interventions ADLs/Self Care Home Management;DME Instruction;Gait training;Stair training;Functional mobility training;Therapeutic activities;Therapeutic exercise;Balance training;Neuromuscular re-education;Patient/family education;Orthotic Fit/Training;Manual techniques;Energy conservation;Taping   PT Next Visit Plan re-assess lt knee pain and  if remains severe, ask pt if he would be willing to have it xrayed if MD will order x-ray (I didn't want to pursue without knowing if he would go for x-ray); educte in fall prevention; initiate HEP for LLE strengthening and balance;    Consulted and Agree with Plan of Care Patient      Patient will benefit from skilled therapeutic intervention in order to improve the following deficits and impairments:  Abnormal gait, Cardiopulmonary status limiting activity, Decreased balance, Decreased knowledge of use of DME, Decreased mobility, Decreased strength, Difficulty walking, Impaired sensation, Pain  Visit Diagnosis: Muscle weakness (generalized) - Plan: PT plan of care cert/re-cert  Unsteadiness on feet - Plan: PT plan of care cert/re-cert  Other disturbances of skin sensation - Plan: PT plan of care cert/re-cert  Acute pain of left knee - Plan: PT plan of care cert/re-cert  Hemiplegia and hemiparesis following cerebral infarction affecting left non-dominant side ([place]) - Plan: PT plan of care cert/re-cert      G-Codes - 38/10/17 1845    Functional Assessment Tool Used (Outpatient Only) Gait velocity 2.8 ft/sec (norm for males 50-59 is 3.07 ft/sec)   Functional Limitation Mobility: Walking and moving around   Mobility: Walking and Moving Around Current Status (P1025) At least 20 percent but less than 40 percent impaired, limited or restricted   Mobility: Walking and Moving Around Goal Status [phone]) At least 1 percent but less than 20 percent impaired, limited or restricted       Problem List Patient Active Problem List   Diagnosis Date Noted  . Right basal ganglia embolic stroke ([place]) 82/42/3536  . Stroke (cerebrum) ([place]) 12/04/2016  . Tobacco use disorder   . Coronary artery disease   . GERD (gastroesophageal reflux disease)   . Hyperlipidemia   . Pleural effusion     Rexanne Mano, PT 12/28/2016, 7:21 PM  [facility] [address] [place], [place], [address] Phone: [phone]   Fax:  [phone]  Name: Brandon Martin MRN: [id number] Date of Birth: [birth year]

## 2016-12-28 NOTE — Therapy (Signed)
R MCA CVA on 12/04/2016 - pt discharged home with several family members on 12/16/2016 after inpt rehab stay.  Pt presents today with the following impairments that impact ADL, IADL, leisure activities:  L non dominant hemiplegia, altered mix tone LUE, impaired coordination LUE, abnormal posture, pain in L shoulder, decreased AROM, decreased strength LUE, decreased functional use of LUE, decreased balance, decreased functional mobility, altered sensation LUE.  Pt will benefit from skilled OT to address these deficits to maximize independence and improve functional use of LUE.     Occupational Profile and client history currently impacting functional performance PMH: HTN, CAD, coronary artery bypass surgery, MI,, HLD, +tobacco use, cervical disc compression, pt also states he "broke his back" several years ago.     Occupational performance deficits (Please refer to evaluation for details): ADL's;IADL's;Leisure   Rehab Potential Good   OT Frequency 2x / week   OT Duration 8 weeks   OT Treatment/Interventions Self-care/ADL training;Moist Heat;Electrical Stimulation;Cryotherapy;DME and/or AE instruction;Neuromuscular  education;Therapeutic exercise;Functional Mobility Training;Manual Therapy;Passive range of motion;Therapeutic activities;Patient/family education;Balance training   Plan Further assess pain, address pain and if possible iniate HEP, education for pt on shoulder pain, NMR for LUE, posture and functional mobility   Clinical Decision Making Multiple treatment options, significant modification of task necessary   Consulted and Agree with Plan of Care Patient;Family member/caregiver   Family Member Consulted brother present for part of session      Patient will benefit from skilled therapeutic intervention in order to improve the following deficits and impairments:  Abnormal gait, Decreased balance, Decreased coordination, Decreased knowledge of use of DME, Decreased mobility, Decreased range of motion, Difficulty walking, Decreased strength, Impaired sensation, Impaired tone, Impaired UE functional use, Pain  Visit Diagnosis: Hemiplegia and hemiparesis following cerebral infarction affecting left non-dominant side ([place]) - Plan: Ot plan of care cert/re-cert  Acute pain of left shoulder - Plan: Ot plan of care cert/re-cert  Muscle weakness (generalized) - Plan: Ot plan of care cert/re-cert  Unsteadiness on feet - Plan: Ot plan of care cert/re-cert  Other disturbances of skin sensation - Plan: Ot plan of care cert/re-cert    Problem List Patient Active Problem List   Diagnosis Date Noted  . Right basal ganglia embolic stroke ([place]) 23/30/0762  . Stroke (cerebrum) ([place]) 12/04/2016  . Tobacco use disorder   . Coronary artery disease   . GERD (gastroesophageal reflux disease)   . Hyperlipidemia   . Pleural effusion     Quay Burow, OTR/L 12/28/2016, 10:02 AM  [facility] [address] [place], [place], [address] Phone: [phone]   Fax:  [phone]  Name: Brandon Martin MRN: [id number] Date of Birth: [birth year]  R MCA CVA on 12/04/2016 - pt discharged home with several family members on 12/16/2016 after inpt rehab stay.  Pt presents today with the following impairments that impact ADL, IADL, leisure activities:  L non dominant hemiplegia, altered mix tone LUE, impaired coordination LUE, abnormal posture, pain in L shoulder, decreased AROM, decreased strength LUE, decreased functional use of LUE, decreased balance, decreased functional mobility, altered sensation LUE.  Pt will benefit from skilled OT to address these deficits to maximize independence and improve functional use of LUE.     Occupational Profile and client history currently impacting functional performance PMH: HTN, CAD, coronary artery bypass surgery, MI,, HLD, +tobacco use, cervical disc compression, pt also states he "broke his back" several years ago.     Occupational performance deficits (Please refer to evaluation for details): ADL's;IADL's;Leisure   Rehab Potential Good   OT Frequency 2x / week   OT Duration 8 weeks   OT Treatment/Interventions Self-care/ADL training;Moist Heat;Electrical Stimulation;Cryotherapy;DME and/or AE instruction;Neuromuscular  education;Therapeutic exercise;Functional Mobility Training;Manual Therapy;Passive range of motion;Therapeutic activities;Patient/family education;Balance training   Plan Further assess pain, address pain and if possible iniate HEP, education for pt on shoulder pain, NMR for LUE, posture and functional mobility   Clinical Decision Making Multiple treatment options, significant modification of task necessary   Consulted and Agree with Plan of Care Patient;Family member/caregiver   Family Member Consulted brother present for part of session      Patient will benefit from skilled therapeutic intervention in order to improve the following deficits and impairments:  Abnormal gait, Decreased balance, Decreased coordination, Decreased knowledge of use of DME, Decreased mobility, Decreased range of motion, Difficulty walking, Decreased strength, Impaired sensation, Impaired tone, Impaired UE functional use, Pain  Visit Diagnosis: Hemiplegia and hemiparesis following cerebral infarction affecting left non-dominant side ([place]) - Plan: Ot plan of care cert/re-cert  Acute pain of left shoulder - Plan: Ot plan of care cert/re-cert  Muscle weakness (generalized) - Plan: Ot plan of care cert/re-cert  Unsteadiness on feet - Plan: Ot plan of care cert/re-cert  Other disturbances of skin sensation - Plan: Ot plan of care cert/re-cert    Problem List Patient Active Problem List   Diagnosis Date Noted  . Right basal ganglia embolic stroke ([place]) 23/30/0762  . Stroke (cerebrum) ([place]) 12/04/2016  . Tobacco use disorder   . Coronary artery disease   . GERD (gastroesophageal reflux disease)   . Hyperlipidemia   . Pleural effusion     Quay Burow, OTR/L 12/28/2016, 10:02 AM  [facility] [address] [place], [place], [address] Phone: [phone]   Fax:  [phone]  Name: Brandon Martin MRN: [id number] Date of Birth: [birth year]  [facility] [address] [place], [place], [address] Phone: [phone]   Fax:  [phone]  Occupational Therapy Treatment  Patient Details  Name: Brandon Martin MRN: [id number] Date of Birth: 02/03/1966 Referring Provider: Dr. Danae Chen  Encounter Date: 12/28/2016      OT End of Session - 12/28/16 0958    Visit Number 1   Number of Visits 16   Date for OT Re-Evaluation 02/22/17   Authorization Type UHC no visit limitation no auth   OT Start Time 0803   OT Stop Time 0842   OT Time Calculation (min) 39 min   Activity Tolerance Patient tolerated treatment well      Past Medical History:  Diagnosis Date  . Coronary artery disease   . GERD (gastroesophageal reflux disease)   . Hyperlipidemia   . Hypertension   . Pleural effusion   . Tobacco use disorder     Past Surgical History:  Procedure Laterality Date  . CORONARY ARTERY BYPASS GRAFT  x 3    There were no vitals filed for this visit.      Subjective Assessment - 12/28/16 0807    Subjective  this tape helped my shoulder when I was in the hospital   Patient is accompained by: Family member  brother [place]   Pertinent History see epic; Pt with R MCA CVA on 12/04/2016     Patient Stated Goals walk better and use  my arm   Currently in Pain? Yes   Pain Score 6    Pain Location Shoulder   Pain Orientation Left   Pain Descriptors / Indicators Sharp;Stabbing   Pain Type Acute pain   Pain Onset More than a month ago   Pain Frequency Intermittent   Aggravating Factors  Try to raise my arm, if walk too much, sometimes worse when I get up - the dr gave me exercises to do while I waited for therapy   Pain Relieving Factors taping, pain meds but I don't have any right now.    Multiple Pain Sites Yes   Pain Score 7   Pain Location Knee   Pain Orientation Left   Pain Descriptors / Indicators Sharp;Pounding   Pain Type Acute pain   Pain Frequency Constant    Aggravating Factors  going up and down steps - I fell when I had the stroke and then again on  my birthday and hit the knee both times.    Pain Relieving Factors ice helps             [facility] OT Assessment - 12/28/16 0001      Assessment   Diagnosis R MCA CVA   Referring Provider Dr. Danae Chen   Onset Date 12/04/16   Prior Therapy inpt rehab PT, OT.       Precautions   Precautions None     Restrictions   Weight Bearing Restrictions No     Balance Screen   Has the patient fallen in the past 6 months Yes   How many times? 1  has PT eval today     Home  Environment   Family/patient expects to be discharged to: Private residence   Living Arrangements Parent  mom, 3 brothers and sister in law   Available Help at Discharge Available 24 hours/day   Type of [place] One level   Bathroom Shower/Tub Tub/Shower unit   Bathroom Toilet Handicapped height   Additional Comments shower seat without  R MCA CVA on 12/04/2016 - pt discharged home with several family members on 12/16/2016 after inpt rehab stay.  Pt presents today with the following impairments that impact ADL, IADL, leisure activities:  L non dominant hemiplegia, altered mix tone LUE, impaired coordination LUE, abnormal posture, pain in L shoulder, decreased AROM, decreased strength LUE, decreased functional use of LUE, decreased balance, decreased functional mobility, altered sensation LUE.  Pt will benefit from skilled OT to address these deficits to maximize independence and improve functional use of LUE.     Occupational Profile and client history currently impacting functional performance PMH: HTN, CAD, coronary artery bypass surgery, MI,, HLD, +tobacco use, cervical disc compression, pt also states he "broke his back" several years ago.     Occupational performance deficits (Please refer to evaluation for details): ADL's;IADL's;Leisure   Rehab Potential Good   OT Frequency 2x / week   OT Duration 8 weeks   OT Treatment/Interventions Self-care/ADL training;Moist Heat;Electrical Stimulation;Cryotherapy;DME and/or AE instruction;Neuromuscular  education;Therapeutic exercise;Functional Mobility Training;Manual Therapy;Passive range of motion;Therapeutic activities;Patient/family education;Balance training   Plan Further assess pain, address pain and if possible iniate HEP, education for pt on shoulder pain, NMR for LUE, posture and functional mobility   Clinical Decision Making Multiple treatment options, significant modification of task necessary   Consulted and Agree with Plan of Care Patient;Family member/caregiver   Family Member Consulted brother present for part of session      Patient will benefit from skilled therapeutic intervention in order to improve the following deficits and impairments:  Abnormal gait, Decreased balance, Decreased coordination, Decreased knowledge of use of DME, Decreased mobility, Decreased range of motion, Difficulty walking, Decreased strength, Impaired sensation, Impaired tone, Impaired UE functional use, Pain  Visit Diagnosis: Hemiplegia and hemiparesis following cerebral infarction affecting left non-dominant side ([place]) - Plan: Ot plan of care cert/re-cert  Acute pain of left shoulder - Plan: Ot plan of care cert/re-cert  Muscle weakness (generalized) - Plan: Ot plan of care cert/re-cert  Unsteadiness on feet - Plan: Ot plan of care cert/re-cert  Other disturbances of skin sensation - Plan: Ot plan of care cert/re-cert    Problem List Patient Active Problem List   Diagnosis Date Noted  . Right basal ganglia embolic stroke ([place]) 23/30/0762  . Stroke (cerebrum) ([place]) 12/04/2016  . Tobacco use disorder   . Coronary artery disease   . GERD (gastroesophageal reflux disease)   . Hyperlipidemia   . Pleural effusion     Quay Burow, OTR/L 12/28/2016, 10:02 AM  [facility] [address] [place], [place], [address] Phone: [phone]   Fax:  [phone]  Name: Brandon Martin MRN: [id number] Date of Birth: [birth year]

## 2017-01-06 ENCOUNTER — Encounter: Payer: Self-pay | Admitting: Occupational Therapy

## 2017-01-06 ENCOUNTER — Telehealth: Payer: Self-pay | Admitting: Physical Therapy

## 2017-01-06 ENCOUNTER — Ambulatory Visit: Payer: Medicare Other | Admitting: Occupational Therapy

## 2017-01-06 ENCOUNTER — Ambulatory Visit: Payer: Medicare Other | Admitting: Physical Therapy

## 2017-01-06 ENCOUNTER — Encounter: Payer: Self-pay | Admitting: Physical Therapy

## 2017-01-06 DIAGNOSIS — M6281 Muscle weakness (generalized): Secondary | ICD-10-CM

## 2017-01-06 DIAGNOSIS — R2681 Unsteadiness on feet: Secondary | ICD-10-CM

## 2017-01-06 DIAGNOSIS — I69354 Hemiplegia and hemiparesis following cerebral infarction affecting left non-dominant side: Secondary | ICD-10-CM | POA: Diagnosis not present

## 2017-01-06 DIAGNOSIS — R208 Other disturbances of skin sensation: Secondary | ICD-10-CM

## 2017-01-06 DIAGNOSIS — M25562 Pain in left knee: Secondary | ICD-10-CM

## 2017-01-06 DIAGNOSIS — M25512 Pain in left shoulder: Secondary | ICD-10-CM

## 2017-01-06 NOTE — Therapy (Signed)
[facility] [address] [place], [place], [address] Phone: [phone]   Fax:  [phone]  Occupational Therapy Treatment  Patient Details  Name: Brandon Martin MRN: [id number] Date of Birth: 28-Jul-1965 Referring Provider: Dr. Rodman Pickle  Encounter Date: 01/06/2017      OT End of Session - 01/06/17 1034    Visit Number 2   Number of Visits 16   Date for OT Re-Evaluation 02/22/17   Authorization Type UHC no visit limitation no auth   OT Start Time [id number]   OT Stop Time 1015   OT Time Calculation (min) 44 min   Activity Tolerance Patient tolerated treatment well      Past Medical History:  Diagnosis Date  . Coronary artery disease   . GERD (gastroesophageal reflux disease)   . Hyperlipidemia   . Hypertension   . Pleural effusion   . Tobacco use disorder     Past Surgical History:  Procedure Laterality Date  . CORONARY ARTERY BYPASS GRAFT  x 3    There were no vitals filed for this visit.      Subjective Assessment - 01/06/17 0934    Pertinent History (P)  see epic; Pt with R MCA CVA on 12/04/2016     Patient Stated Goals (P)  walk better and use  my arm   Currently in Pain? (P)  Yes   Pain Score (P)  6    Pain Location (P)  Knee   Pain Orientation (P)  Left   Pain Descriptors / Indicators (P)  Sharp;Stabbing   Pain Type (P)  Acute pain   Pain Onset (P)  More than a month ago   Pain Frequency (P)  Intermittent   Aggravating Factors  (P)  see PT note   Multiple Pain Sites (P)  Yes   Pain Score (P)  3   Pain Location (P)  Shoulder   Pain Orientation (P)  Left   Pain Descriptors / Indicators (P)  Sharp   Pain Frequency (P)  Constant   Aggravating Factors  (P)  raising my arm makes it worse                       OT Treatments/Exercises (OP) - 01/06/17 0001      ADLs   ADL Comments Pt continues with L shoulder pain - see notes under manual therapy. Reinforced sleeping position to  assist with decreasing pain.  Also discussed again avoiding any movements that cause pain as pt tends to repetitively attempt to raise LUE however does so with significant flexor synergy. Pt stated "sometimes I have to raise it." Demonstrated assisted reach technique and pt able to return demonstrate.      Neurological Re-education Exercises   Other Exercises 1 Neuro re ed following manual therapy to address increasing activity in proximal shoulder muscles for depression with stabilization, scapular stability.  Attempted shoulder flexion in supine with ball - pt unable to tolerate from pain standpoint. Transitioned into sitting and instructed pt in foward lean with bilateral reach to begin to address shoulder flexion - see pt instruction section for details. Pt able to return demonstrate.       Manual Therapy   Manual Therapy Taping;Soft tissue mobilization;Joint mobilization;Scapular mobilization   Manual therapy comments At pt's request provided kinesiotaping to L shoulder for pain. Pt reports therapists were taping in inpt rehab and it helped to decrease the pain. Pt iwth inferior anterior subluxation with pain  around the entire glenohumeral joint as well as radiating up into the neck. Taped accordingly.  Pt reported immediate improvement in pain symptoms.  Pt also reports today that he had significant shoulder pain prior to the stroke and that the inpt rehab MD told pt he felt he likely had a rotator cuff tear prior to the stroke and may need surgery. Soft tissue, joint mob and scap mob to address pain and malalgnment in shoulder girdle prior to activation with LUE. Pt tolerated gentle mobs.                 OT Education - 01/06/17 1031    Education provided Yes   Education Details eval, goals, POC.  Revised HEP for LUE   Person(s) Educated Patient   Methods Explanation;Demonstration;Handout   Comprehension Verbalized understanding;Returned demonstration          OT Short Term Goals  - 01/06/17 1031      OT SHORT TERM GOAL #1   Title Pt and family will be mod I with HEP - 01/25/2017   Status On-going     OT SHORT TERM GOAL #2   Title PT will report no greater pain than 4/10 LUE with shoulder flexion to 65* of unilateral functional reach (baseline = 55 with 6/10 pain)   Status On-going     OT SHORT TERM GOAL #3   Title Pt will demonstrate ability to pick up cylinderical object with low reach with LUE with min compensations.    Status On-going     OT SHORT TERM GOAL #4   Title Pt will demonstrate ability to release cyndrical object with low reach with no more than min facilitation.    Status On-going     OT SHORT TERM GOAL #5   Title Pt will demonstrate understanding of at least 2 tone reduction techniques in order to prep hand for functional use.    Status On-going     OT SHORT TERM GOAL #6   Title Pt will be mod I with shower transfers   Status On-going     OT SHORT TERM GOAL #7   Title Pt will be mod I using both hands to cut food during eating.   Status On-going           OT Long Term Goals - 01/06/17 1032      OT LONG TERM GOAL #1   Title Pt and family will be mod I with upgraded HEP - 02/22/2017   Status On-going     OT LONG TERM GOAL #2   Title Pt will demonstrate ability for mid reach to lift light object off shelf with LUE with min compensations and pain no greater than 2/10   Status On-going     OT LONG TERM GOAL #3   Title Pt will demonstrate ability to pick up small object with either 2 pt or 3 pt pinch   Status On-going     OT LONG TERM GOAL #4   Title Pt will demonstrate ability to walk on uneven outdoor surfaces while carrying object in LUE in prep for returning to yard work.    Status On-going     OT LONG TERM GOAL #5   Title Pt will demonstrate ability for bilateral low reach to pick up 4 pound object during functional task.    Status Revised  based on new info from pt that pt had significant shoulder pain prior to stroke and  likely rotator cuff tear  Plan - 01/06/17 1033    Clinical Impression Statement Pt in agreement with POC and goals. Pt progressing toward goals.    Rehab Potential Good   OT Frequency 2x / week   OT Duration 8 weeks   OT Treatment/Interventions Self-care/ADL training;Moist Heat;Electrical Stimulation;Cryotherapy;DME and/or AE instruction;Neuromuscular education;Therapeutic exercise;Functional Mobility Training;Manual Therapy;Passive range of motion;Therapeutic activities;Patient/family education;Balance training   Plan address pain, check HEP add putty if possible, NMR  and manual therapy for LUE, trunk, posture and functional mobility   Consulted and Agree with Plan of Care Patient      Patient will benefit from skilled therapeutic intervention in order to improve the following deficits and impairments:  Abnormal gait, Decreased balance, Decreased coordination, Decreased knowledge of use of DME, Decreased mobility, Decreased range of motion, Difficulty walking, Decreased strength, Impaired sensation, Impaired tone, Impaired UE functional use, Pain  Visit Diagnosis: Muscle weakness (generalized)  Unsteadiness on feet  Other disturbances of skin sensation  Hemiplegia and hemiparesis following cerebral infarction affecting left non-dominant side (HCC)  Acute pain of left shoulder    Problem List Patient Active Problem List   Diagnosis Date Noted  . Right basal ganglia embolic stroke (HCC) 12/07/2016  . Stroke (cerebrum) (HCC) 12/04/2016  . Tobacco use disorder   . Coronary artery disease   . GERD (gastroesophageal reflux disease)   . Hyperlipidemia   . Pleural effusion     Norton Pastel , OTR/L 01/06/2017, 10:36 AM  [facility] [address] [place], [place], [address] Phone: [phone]   Fax:  [phone]  Name: PHELPS EICHE MRN: [id number] Date of Birth: August 02, 1965

## 2017-01-06 NOTE — Telephone Encounter (Signed)
2 views of left knee xrays have been orderd using ACUTE PAIN IN LEFT KNEE icd-10 code for [facility] radiology

## 2017-01-06 NOTE — Telephone Encounter (Signed)
Please order 2 v left knee left knee pain after fall 4 wks ago

## 2017-01-06 NOTE — Telephone Encounter (Signed)
Dr. Letta Pate,   I recently evaluated Mr. Brandon Martin for OPPT and I am concerned that he continues to have severe pain over the area just lateral to the inferior pole of Left patella due to a fall on 12/04/16.   I do not see record of an xray being done. He reports that an xray was not done because it was believed the pain is due to his severe arthritis. However with specific area of point-tenderness 4 weeks after his fall, I am asking if an xray would be appropriate? Or knee immobilizer?  The pain in his left knee is significantly limiting his ability to participate with PT at this time and I would appreciate anything you can offer to help this gentleman.   Thank you, Barry Brunner, PT

## 2017-01-06 NOTE — Therapy (Signed)
Abnormal gait, Cardiopulmonary status limiting activity, Decreased balance, Decreased knowledge of use of DME, Decreased mobility, Decreased strength, Difficulty walking, Impaired sensation, Pain  Visit Diagnosis: Muscle weakness (generalized)  Acute pain of left knee  Hemiplegia and hemiparesis following cerebral infarction affecting left non-dominant side [facility])     Problem List Patient Active Problem List   Diagnosis Date Noted  . Right basal ganglia embolic stroke ([place]) 83/37/4451  . Stroke (cerebrum) ([place]) 12/04/2016  . Tobacco use disorder   . Coronary artery disease   . GERD (gastroesophageal reflux disease)   . Hyperlipidemia   . Pleural effusion     Rexanne Mano, PT 01/06/2017, 12:39 PM  [place] [facility] [address] [place], [place], [address] Phone: [phone]   Fax:  [phone]  Name: Brandon Martin MRN: [id number] Date of Birth: 1965/08/08  [facility] [address] [place], [place], [address] Phone: [phone]   Fax:  [phone]  Physical Therapy Treatment  Patient Details  Name: Brandon Martin MRN: [id number] Date of Birth: [birth year] Referring Provider: Alysia Penna  Encounter Date: 01/06/2017      PT End of Session - 01/06/17 1229    Visit Number 2   Number of Visits 17   Date for PT Re-Evaluation 02/25/17   Authorization Type UHC Medicare   Authorization Time Period 12/28/16 to 02/25/17   PT Start Time 0846   PT Stop Time 0932   PT Time Calculation (min) 46 min   Activity Tolerance Patient limited by pain   Behavior During Therapy [facility] for tasks assessed/performed      Past Medical History:  Diagnosis Date  . Coronary artery disease   . GERD (gastroesophageal reflux disease)   . Hyperlipidemia   . Hypertension   . Pleural effusion   . Tobacco use disorder     Past Surgical History:  Procedure Laterality Date  . CORONARY ARTERY BYPASS GRAFT  x 3    There were no vitals filed for this visit.      Subjective Assessment - 01/06/17 0847    Subjective Denies falls. Knee is none to slightly better. Agrees to getting an xray if the  MD will order. States he is going to ask landlord for grab bars for bathroom.    Pertinent History Lt knee 8 surgeries due to crush injury    Currently in Pain? Yes   Pain Score 6    Pain Location Knee   Pain Orientation Left  pt points to specifically to distal pole of patella   Pain Descriptors / Indicators Sharp;Stabbing   Pain Type Acute pain   Pain Onset More than a month ago                         [facility] Adult PT Treatment/Exercise - 01/06/17 1222      Ambulation/Gait   Ambulation/Gait Assistance 6: Modified independent (Device/Increase time)   Ambulation Distance (Feet) 100 Feet  lobby to clinic; minimized walking in clinic due to knee pai   Assistive device None   Gait  Pattern Step-through pattern;Decreased arm swing - left;Decreased stance time - left;Decreased stride length;Decreased weight shift to left;Decreased trunk rotation;Wide base of support   Ambulation Surface Level   Gait Comments continues to be limited by Lt knee pain 4 weeks post initial fall. Pt is agreeable to have xray if MD orders     Self-Care   Self-Care Applied kinesiotape to lt knee (chondromalacia pattern with additional vertical strip to try to "lift" inferior patella to reduce pressure)     Exercises   Exercises Knee/Hip     Knee/Hip Exercises: Aerobic   Nustep L2, 1.5 minutes LEs only with incr knee pain     Knee/Hip Exercises: Standing   Terminal Knee Extension Strengthening;Left;1 set;10 reps   Theraband Level (Terminal Knee Extension) Level 3 (Green)     Knee/Hip Exercises: Seated   Hamstring Curl Strengthening;Left;2 sets;10 reps  green band                PT Education - 01/06/17 1229    Education provided Yes   Education Details see HEP; pt familiar with use of kinesiotape for lt knee (was used on CIR)   Person(s) Educated Patient   Methods Explanation;Demonstration;Handout   Comprehension Verbalized understanding;Returned demonstration  [facility] [address] [place], [place], [address] Phone: [phone]   Fax:  [phone]  Physical Therapy Treatment  Patient Details  Name: Brandon Martin MRN: [id number] Date of Birth: [birth year] Referring Provider: Alysia Penna  Encounter Date: 01/06/2017      PT End of Session - 01/06/17 1229    Visit Number 2   Number of Visits 17   Date for PT Re-Evaluation 02/25/17   Authorization Type UHC Medicare   Authorization Time Period 12/28/16 to 02/25/17   PT Start Time 0846   PT Stop Time 0932   PT Time Calculation (min) 46 min   Activity Tolerance Patient limited by pain   Behavior During Therapy [facility] for tasks assessed/performed      Past Medical History:  Diagnosis Date  . Coronary artery disease   . GERD (gastroesophageal reflux disease)   . Hyperlipidemia   . Hypertension   . Pleural effusion   . Tobacco use disorder     Past Surgical History:  Procedure Laterality Date  . CORONARY ARTERY BYPASS GRAFT  x 3    There were no vitals filed for this visit.      Subjective Assessment - 01/06/17 0847    Subjective Denies falls. Knee is none to slightly better. Agrees to getting an xray if the  MD will order. States he is going to ask landlord for grab bars for bathroom.    Pertinent History Lt knee 8 surgeries due to crush injury    Currently in Pain? Yes   Pain Score 6    Pain Location Knee   Pain Orientation Left  pt points to specifically to distal pole of patella   Pain Descriptors / Indicators Sharp;Stabbing   Pain Type Acute pain   Pain Onset More than a month ago                         [facility] Adult PT Treatment/Exercise - 01/06/17 1222      Ambulation/Gait   Ambulation/Gait Assistance 6: Modified independent (Device/Increase time)   Ambulation Distance (Feet) 100 Feet  lobby to clinic; minimized walking in clinic due to knee pai   Assistive device None   Gait  Pattern Step-through pattern;Decreased arm swing - left;Decreased stance time - left;Decreased stride length;Decreased weight shift to left;Decreased trunk rotation;Wide base of support   Ambulation Surface Level   Gait Comments continues to be limited by Lt knee pain 4 weeks post initial fall. Pt is agreeable to have xray if MD orders     Self-Care   Self-Care Applied kinesiotape to lt knee (chondromalacia pattern with additional vertical strip to try to "lift" inferior patella to reduce pressure)     Exercises   Exercises Knee/Hip     Knee/Hip Exercises: Aerobic   Nustep L2, 1.5 minutes LEs only with incr knee pain     Knee/Hip Exercises: Standing   Terminal Knee Extension Strengthening;Left;1 set;10 reps   Theraband Level (Terminal Knee Extension) Level 3 (Green)     Knee/Hip Exercises: Seated   Hamstring Curl Strengthening;Left;2 sets;10 reps  green band                PT Education - 01/06/17 1229    Education provided Yes   Education Details see HEP; pt familiar with use of kinesiotape for lt knee (was used on CIR)   Person(s) Educated Patient   Methods Explanation;Demonstration;Handout   Comprehension Verbalized understanding;Returned demonstration

## 2017-01-06 NOTE — Patient Instructions (Addendum)
Knee Extension: Terminal - Standing (Single Leg)    Pull band up to behind your knee. Have someone sit in front of you and hold tension on the band OR put the knot of the band between the door and door jam and close the door to secure band. Stand in shoulder width stance. Allow tension of band to slightly bend knee. Pull leg back, straightening knee. Repeat _15_ times per set. Do _2-3_ sets per session. Do _5_ sessions per week. Anchor Height: Knee  http://tub.exer.us/35   Copyright  VHI. All rights reserved.    FLEXION: Sitting - Resistance Band (Active)    Anchor band in door and sit facing door OR have someone sit in front of you and hold the band. Start with right leg extended. Against resistance band, bend knee and draw foot backward under your chair. Complete _2-3__ sets of _15__ repetitions. Perform __5_ sessions per week.  http://gtsc.exer.us/230   Copyright  VHI. All rights reserved.   Resisted - Four Way Hip     With theraband around left ankle, balance on right leg and complete leg kicks in the following directions:  1. Facing toward theraband, extend right leg behind you with knee straight. Avoid bending forward at your hips. Repeat 10-15 times. 2. Turn to right, slowly kick leg out to the side while keeping your toes facing forward. Avoid leaning to the side. Repeat 10-15 times. 3. Turn to face away from theraband, kick leg straight forward keeping knee straight. Repeat 10-15 times. 4. Turn to the right and take a step away from the theraband. Pull right leg in and slightly in front of left. Repeat 10-15 times.  Then repeat the 4 positions with the band around the other ankle. This works your left leg having to support you while right leg moves.    Fall Prevention in the Home Falls can cause injuries and can affect people from all age groups. There are many simple things that you can do to make your home safe and to help prevent falls. What can I do on the  outside of my home?  Regularly repair the edges of walkways and driveways and fix any cracks.  Remove high doorway thresholds.  Trim any shrubbery on the main path into your home.  Use bright outdoor lighting.  Clear walkways of debris and clutter, including tools and rocks.  Regularly check that handrails are securely fastened and in good repair. Both sides of any steps should have handrails.  Install guardrails along the edges of any raised decks or porches.  Have leaves, snow, and ice cleared regularly.  Use sand or salt on walkways during winter months.  In the garage, clean up any spills right away, including grease or oil spills. What can I do in the bathroom?  Use night lights.  Install grab bars by the toilet and in the tub and shower. Do not use towel bars as grab bars.  Use non-skid mats or decals on the floor of the tub or shower.  If you need to sit down while you are in the shower, use a plastic, non-slip stool.  Keep the floor dry. Immediately clean up any water that spills on the floor.  Remove soap buildup in the tub or shower on a regular basis.  Attach bath mats securely with double-sided non-slip rug tape.  Remove throw rugs and other tripping hazards from the floor. What can I do in the bedroom?  Use night lights.  Make sure that a  bedside light is easy to reach.  Do not use oversized bedding that drapes onto the floor.  Have a firm chair that has side arms to use for getting dressed.  Remove throw rugs and other tripping hazards from the floor. What can I do in the kitchen?  Clean up any spills right away.  Avoid walking on wet floors.  Place frequently used items in easy-to-reach places.  If you need to reach for something above you, use a sturdy step stool that has a grab bar.  Keep electrical cables out of the way.  Do not use floor polish or wax that makes floors slippery. If you have to use wax, make sure that it is non-skid floor  wax.  Remove throw rugs and other tripping hazards from the floor. What can I do in the stairways?  Do not leave any items on the stairs.  Make sure that there are handrails on both sides of the stairs. Fix handrails that are broken or loose. Make sure that handrails are as long as the stairways.  Check any carpeting to make sure that it is firmly attached to the stairs. Fix any carpet that is loose or worn.  Avoid having throw rugs at the top or bottom of stairways, or secure the rugs with carpet tape to prevent them from moving.  Make sure that you have a light switch at the top of the stairs and the bottom of the stairs. If you do not have them, have them installed. What are some other fall prevention tips?  Wear closed-toe shoes that fit well and support your feet. Wear shoes that have rubber soles or low heels.  When you use a stepladder, make sure that it is completely opened and that the sides are firmly locked. Have someone hold the ladder while you are using it. Do not climb a closed stepladder.  Add color or contrast paint or tape to grab bars and handrails in your home. Place contrasting color strips on the first and last steps.  Use mobility aids as needed, such as canes, walkers, scooters, and crutches.  Turn on lights if it is dark. Replace any light bulbs that burn out.  Set up furniture so that there are clear paths. Keep the furniture in the same spot.  Fix any uneven floor surfaces.  Choose a carpet design that does not hide the edge of steps of a stairway.  Be aware of any and all pets.  Review your medicines with your healthcare provider. Some medicines can cause dizziness or changes in blood pressure, which increase your risk of falling. Talk with your health care provider about other ways that you can decrease your risk of falls. This may include working with a physical therapist or trainer to improve your strength, balance, and endurance. This information is  not intended to replace advice given to you by your health care provider. Make sure you discuss any questions you have with your health care provider. Document Released: 06/18/2002 Document Revised: 11/25/2015 Document Reviewed: 08/02/2014 Elsevier Interactive Patient Education  2017 [employer].

## 2017-01-06 NOTE — Patient Instructions (Signed)
Home exercises for your arm. Do 2 times per day every day.  You can do the ones that inpatient rehab gave you except for the one where you raise your arm.   Add this one to it.  1. Sit on a firm surface.  Clasp hands.  With arms straight reach toward the floor leaning over your left knee. HOLD FOR A SLOW COUNT OF 5, then sit back up. Do 10, rest then do 10 more.

## 2017-01-10 ENCOUNTER — Telehealth: Payer: Self-pay

## 2017-01-10 NOTE — Telephone Encounter (Signed)
SENT NOTES TO SCHEDULING 

## 2017-01-11 ENCOUNTER — Encounter: Payer: Self-pay | Admitting: Occupational Therapy

## 2017-01-11 ENCOUNTER — Ambulatory Visit: Payer: Medicare Other | Attending: Physical Medicine & Rehabilitation | Admitting: Occupational Therapy

## 2017-01-11 DIAGNOSIS — M25512 Pain in left shoulder: Secondary | ICD-10-CM

## 2017-01-11 DIAGNOSIS — R208 Other disturbances of skin sensation: Secondary | ICD-10-CM

## 2017-01-11 DIAGNOSIS — M6281 Muscle weakness (generalized): Secondary | ICD-10-CM

## 2017-01-11 DIAGNOSIS — M25562 Pain in left knee: Secondary | ICD-10-CM | POA: Diagnosis present

## 2017-01-11 DIAGNOSIS — R2681 Unsteadiness on feet: Secondary | ICD-10-CM | POA: Diagnosis present

## 2017-01-11 DIAGNOSIS — I69354 Hemiplegia and hemiparesis following cerebral infarction affecting left non-dominant side: Secondary | ICD-10-CM

## 2017-01-11 NOTE — Therapy (Signed)
[facility] [address] [place], [place], [address] Phone: [phone]   Fax:  [phone]  Occupational Therapy Treatment  Patient Details  Name: Brandon Martin MRN: [id number] Date of Birth: June 06, 1966 Referring Provider: Dr. Rodman Pickle  Encounter Date: 01/11/2017      OT End of Session - 01/11/17 1307    Visit Number 3   Date for OT Re-Evaluation 02/22/17   Authorization Type UHC no visit limitation no auth   OT Start Time 0802   OT Stop Time 0845   OT Time Calculation (min) 43 min   Activity Tolerance Patient tolerated treatment well      Past Medical History:  Diagnosis Date  . Coronary artery disease   . GERD (gastroesophageal reflux disease)   . Hyperlipidemia   . Hypertension   . Pleural effusion   . Tobacco use disorder     Past Surgical History:  Procedure Laterality Date  . CORONARY ARTERY BYPASS GRAFT  x 3    There were no vitals filed for this visit.      Subjective Assessment - 01/11/17 0807    Subjective  The tape made my hand swell so I took it off   Pertinent History see epic; Pt with R MCA CVA on 12/04/2016     Patient Stated Goals walk better and use  my arm   Currently in Pain? Yes   Pain Score 2    Pain Location Shoulder   Pain Orientation Left   Pain Descriptors / Indicators Sharp;Stabbing   Pain Type Acute pain   Pain Onset More than a month ago   Pain Frequency Intermittent   Aggravating Factors  rasing my arm, If I walk too much   Pain Relieving Factors taping, ice, pain meds   Multiple Pain Sites Yes   Pain Score 2   Pain Location Knee   Pain Orientation Left   Pain Descriptors / Indicators Sharp   Pain Type Chronic pain   Pain Onset More than a month ago   Pain Frequency Constant   Aggravating Factors  going up and down steps.     Pain Relieving Factors ice helps                      OT Treatments/Exercises (OP) - 01/11/17 0001      Neurological  Re-education Exercises   Other Exercises 1 Neuro re ed following manual therapy to address increasing pain free ROM as well as addressing improved scapular and shoulder activitation and stabilization.  Addressed shoulder flexion in closed chain for both chest press as well as overhead reach with intermittent faciliation and cues.  ALso addressed open chain abduction in supine with support for arm and pt able to activate to approximately 95* pain free.  Began to work on encouraging shoulder and UE movement without hiking shoulder.  Pt had imrpovement in pain by end of session (see manual ). Will also message phsyatrist to determine if there would be other options for pain mgmt. Pt in agreement.      Manual Therapy   Manual Therapy Joint mobilization;Soft tissue mobilization;Scapular mobilization   Manual therapy comments joint, soft tissue and scapular mob to address L shoulder alignment to reduce pain and faciliate greater painfree ROM in supine.  Initally pt had pain of 4/10 at rest with increasing pain with shoulder flexion.  By end of session in supine pt could achieve approximately 105 * of flexion pain free in  closed chain activity with cues for slow movement.                   OT Short Term Goals - 01/11/17 1257      OT SHORT TERM GOAL #1   Title Pt and family will be mod I with HEP - 01/25/2017   Status On-going     OT SHORT TERM GOAL #2   Title PT will report no greater pain than 4/10 LUE with shoulder flexion to 65* of unilateral functional reach (baseline = 55 with 6/10 pain)   Status On-going     OT SHORT TERM GOAL #3   Title Pt will demonstrate ability to pick up cylinderical object with low reach with LUE with min compensations.    Status On-going     OT SHORT TERM GOAL #4   Title Pt will demonstrate ability to release cyndrical object with low reach with no more than min facilitation.    Status On-going     OT SHORT TERM GOAL #5   Title Pt will demonstrate  understanding of at least 2 tone reduction techniques in order to prep hand for functional use.    Status On-going     OT SHORT TERM GOAL #6   Title Pt will be mod I with shower transfers   Status On-going     OT SHORT TERM GOAL #7   Title Pt will be mod I using both hands to cut food during eating.   Status On-going           OT Long Term Goals - 01/11/17 1258      OT LONG TERM GOAL #1   Title Pt and family will be mod I with upgraded HEP - 02/22/2017   Status On-going     OT LONG TERM GOAL #2   Title Pt will demonstrate ability for mid reach to lift light object off shelf with LUE with min compensations and pain no greater than 2/10   Status On-going     OT LONG TERM GOAL #3   Title Pt will demonstrate ability to pick up small object with either 2 pt or 3 pt pinch   Status On-going     OT LONG TERM GOAL #4   Title Pt will demonstrate ability to walk on uneven outdoor surfaces while carrying object in LUE in prep for returning to yard work.    Status On-going     OT LONG TERM GOAL #5   Title Pt will demonstrate ability for bilateral low reach to pick up 4 pound object during functional task.    Status On-going  based on new info from pt that pt had significant shoulder pain prior to stroke and likely rotator cuff tear               Plan - 01/11/17 1258    Clinical Impression Statement Pt progressing toward goals. Pt with slow improvement in pain and activity with LUE.   OT Frequency 2x / week   OT Duration 8 weeks   OT Treatment/Interventions Self-care/ADL training;Moist Heat;Electrical Stimulation;Cryotherapy;DME and/or AE instruction;Neuromuscular education;Therapeutic exercise;Functional Mobility Training;Manual Therapy;Passive range of motion;Therapeutic activities;Patient/family education;Balance training   Plan address L shoulder pain, revise HEP (closed chain in supine if possible), add putty if possible, NMR and manual therapy    Consulted and Agree with  Plan of Care Patient      Patient will benefit from skilled therapeutic intervention in order to improve the following deficits and  impairments:  Abnormal gait, Decreased balance, Decreased coordination, Decreased knowledge of use of DME, Decreased mobility, Decreased range of motion, Difficulty walking, Decreased strength, Impaired sensation, Impaired tone, Impaired UE functional use, Pain  Visit Diagnosis: Muscle weakness (generalized)  Hemiplegia and hemiparesis following cerebral infarction affecting left non-dominant side (HCC)  Unsteadiness on feet  Other disturbances of skin sensation  Acute pain of left shoulder    Problem List Patient Active Problem List   Diagnosis Date Noted  . Right basal ganglia embolic stroke (HCC) 12/07/2016  . Stroke (cerebrum) (HCC) 12/04/2016  . Tobacco use disorder   . Coronary artery disease   . GERD (gastroesophageal reflux disease)   . Hyperlipidemia   . Pleural effusion     Norton Pastel, OTR/L 01/11/2017, 1:09 PM  [facility] [address] [place], [place], [address] Phone: [phone]   Fax:  [phone]  Name: Brandon Martin MRN: [id number] Date of Birth: 1966/01/22

## 2017-01-14 ENCOUNTER — Ambulatory Visit: Payer: Medicare Other | Admitting: Physical Therapy

## 2017-01-14 ENCOUNTER — Encounter: Payer: Self-pay | Admitting: Physical Therapy

## 2017-01-14 ENCOUNTER — Ambulatory Visit: Payer: Medicare Other | Admitting: Occupational Therapy

## 2017-01-14 DIAGNOSIS — M6281 Muscle weakness (generalized): Secondary | ICD-10-CM

## 2017-01-14 DIAGNOSIS — R208 Other disturbances of skin sensation: Secondary | ICD-10-CM

## 2017-01-14 DIAGNOSIS — I69354 Hemiplegia and hemiparesis following cerebral infarction affecting left non-dominant side: Secondary | ICD-10-CM

## 2017-01-14 DIAGNOSIS — M25512 Pain in left shoulder: Secondary | ICD-10-CM

## 2017-01-14 DIAGNOSIS — R2681 Unsteadiness on feet: Secondary | ICD-10-CM

## 2017-01-14 DIAGNOSIS — M25562 Pain in left knee: Secondary | ICD-10-CM

## 2017-01-14 NOTE — Patient Instructions (Signed)
Bracing With Bridging (Hook-Lying)    With neutral spine, tighten abdominals. Lift bottom and hold for 3 secs. Keep knees stable as you hold in this position. Repeat __10_ times. Complete 2 sets 2-3__ times a day.    Abduction: Clam (Eccentric) - Side-Lying    Lie on side with knees bent with green band around knees. Lift top knee, keeping feet together. Keep trunk steady. Slowly lower for 3-5 seconds. __10_ reps per set, for 2 sets. Complete 2-3 times a day.    ABDUCTION: Standing (Active)    Stand, feet flat. Lift right leg out to side keeping toes pointed forward. Stand straight and avoid leaning over when lifting leg. Then lift left leg (alternating).  Complete __2_ sets of _20__ repetitions. Perform __2-3_ sessions per day.

## 2017-01-14 NOTE — Therapy (Signed)
[facility] [address] [place], [place], [address] Phone: [phone]   Fax:  [phone]  Occupational Therapy Treatment  Patient Details  Name: Brandon Martin MRN: [id number] Date of Birth: 03/11/1966 Referring Provider: Dr. Danae Chen  Encounter Date: 01/14/2017      OT End of Session - 01/14/17 0829    Visit Number 4   Number of Visits 16   Date for OT Re-Evaluation 02/22/17   Authorization Type UHC no visit limitation no auth   OT Start Time 0804   OT Stop Time 0845   OT Time Calculation (min) 41 min   Activity Tolerance Patient tolerated treatment well   Behavior During Therapy [facility] for tasks assessed/performed      Past Medical History:  Diagnosis Date  . Coronary artery disease   . GERD (gastroesophageal reflux disease)   . Hyperlipidemia   . Hypertension   . Pleural effusion   . Tobacco use disorder     Past Surgical History:  Procedure Laterality Date  . CORONARY ARTERY BYPASS GRAFT  x 3    There were no vitals filed for this visit.      Subjective Assessment - 01/14/17 0805    Pertinent History see epic; Pt with R MCA CVA on 12/04/2016     Patient Stated Goals walk better and use  my arm   Currently in Pain? Yes   Pain Score 3    Pain Location Shoulder   Pain Orientation Left   Pain Descriptors / Indicators Aching   Pain Type Acute pain   Pain Onset More than a month ago   Pain Frequency Intermittent   Aggravating Factors  reaising arm   Pain Relieving Factors repositioning            Treatment: supine hotpack applied to left shoulder while therapist performed gentle joint  Mobs to left shoulder and scapular mobilization followed by pt performance  of A/ROM  IP, MP and composite finger flexion, wrist and elbow flexion/ extension. Sidelying AA/ROM shoulder flexion with scapular mobilization, followed by  Supine closed chain shoulder flexion and chest press with ball, 2 sets of 10  reps with therapist providing min-mod facilitation v.c for positioning. (Therapsit did not issue yet for HEP as pt needs additional  reinforcement of shoulder/ scapular positioning. Seated Low range AA/ROM with LUE , with min facilitation at left scapula/ shoulder. Seated at table flipping large playing cards for increased LUE functional use, min v.c to avoid compensation.                    OT Short Term Goals - 01/11/17 1257      OT SHORT TERM GOAL #1   Title Pt and family will be mod I with HEP - 01/25/2017   Status On-going     OT SHORT TERM GOAL #2   Title PT will report no greater pain than 4/10 LUE with shoulder flexion to 65* of unilateral functional reach (baseline = 55 with 6/10 pain)   Status On-going     OT SHORT TERM GOAL #3   Title Pt will demonstrate ability to pick up cylinderical object with low reach with LUE with min compensations.    Status On-going     OT SHORT TERM GOAL #4   Title Pt will demonstrate ability to release cyndrical object with low reach with no more than min facilitation.    Status On-going     OT SHORT TERM GOAL #  Birth: 09-Nov-1965  [facility] [address] [place], [place], [address] Phone: [phone]   Fax:  [phone]  Occupational Therapy Treatment  Patient Details  Name: Brandon Martin MRN: [id number] Date of Birth: 03/11/1966 Referring Provider: Dr. Danae Chen  Encounter Date: 01/14/2017      OT End of Session - 01/14/17 0829    Visit Number 4   Number of Visits 16   Date for OT Re-Evaluation 02/22/17   Authorization Type UHC no visit limitation no auth   OT Start Time 0804   OT Stop Time 0845   OT Time Calculation (min) 41 min   Activity Tolerance Patient tolerated treatment well   Behavior During Therapy [facility] for tasks assessed/performed      Past Medical History:  Diagnosis Date  . Coronary artery disease   . GERD (gastroesophageal reflux disease)   . Hyperlipidemia   . Hypertension   . Pleural effusion   . Tobacco use disorder     Past Surgical History:  Procedure Laterality Date  . CORONARY ARTERY BYPASS GRAFT  x 3    There were no vitals filed for this visit.      Subjective Assessment - 01/14/17 0805    Pertinent History see epic; Pt with R MCA CVA on 12/04/2016     Patient Stated Goals walk better and use  my arm   Currently in Pain? Yes   Pain Score 3    Pain Location Shoulder   Pain Orientation Left   Pain Descriptors / Indicators Aching   Pain Type Acute pain   Pain Onset More than a month ago   Pain Frequency Intermittent   Aggravating Factors  reaising arm   Pain Relieving Factors repositioning            Treatment: supine hotpack applied to left shoulder while therapist performed gentle joint  Mobs to left shoulder and scapular mobilization followed by pt performance  of A/ROM  IP, MP and composite finger flexion, wrist and elbow flexion/ extension. Sidelying AA/ROM shoulder flexion with scapular mobilization, followed by  Supine closed chain shoulder flexion and chest press with ball, 2 sets of 10  reps with therapist providing min-mod facilitation v.c for positioning. (Therapsit did not issue yet for HEP as pt needs additional  reinforcement of shoulder/ scapular positioning. Seated Low range AA/ROM with LUE , with min facilitation at left scapula/ shoulder. Seated at table flipping large playing cards for increased LUE functional use, min v.c to avoid compensation.                    OT Short Term Goals - 01/11/17 1257      OT SHORT TERM GOAL #1   Title Pt and family will be mod I with HEP - 01/25/2017   Status On-going     OT SHORT TERM GOAL #2   Title PT will report no greater pain than 4/10 LUE with shoulder flexion to 65* of unilateral functional reach (baseline = 55 with 6/10 pain)   Status On-going     OT SHORT TERM GOAL #3   Title Pt will demonstrate ability to pick up cylinderical object with low reach with LUE with min compensations.    Status On-going     OT SHORT TERM GOAL #4   Title Pt will demonstrate ability to release cyndrical object with low reach with no more than min facilitation.    Status On-going     OT SHORT TERM GOAL #

## 2017-01-14 NOTE — Therapy (Signed)
independent with updated HEP for strengthening and balance. (TARGET for all LTGs: 02/25/17)   Time 8   Period Weeks   Status New     PT LONG TERM GOAL #2   Title Patient will be able to verbalize signs and symptoms of CVA and appropriate actions to take should he experience these symptoms.   Time 8   Period Weeks   Status New     PT LONG TERM GOAL #3   Title Patient will increase FGA score to >22/30 to reflect decreased fall risk.   Time 8   Period Weeks   Status New     PT LONG TERM GOAL #4   Title Patient will improve his "quick, but safe" gait velocity to >=4.4 ft/sec for safe crossing of street.    Time 8   Period Weeks   Status New               Plan - 01/14/17 1052    Clinical Impression Statement Patient with less knee pain at beginning of session, however with activity it quickly increased to 6/10. Informed pt re: order for knee xray and to call MD office to schedule. ? if a brace will provide support and decrease pain. Session focused on gait, balance, and strengthening with additions to HEP made. Patient is very motivated and anticipate good progress if Lt knee pain can be managed.    Rehab Potential Good   Clinical Impairments Affecting Rehab Potential severe lt patella pain   PT Frequency 2x / week   PT Duration 8 weeks   PT Treatment/Interventions ADLs/Self Care Home Management;DME Instruction;Gait training;Stair training;Functional mobility training;Therapeutic activities;Therapeutic exercise;Balance training;Neuromuscular re-education;Patient/family education;Orthotic Fit/Training;Manual techniques;Energy conservation;Taping   PT Next Visit Plan review HEP for LLE strengthening and balance from 7/6; check to see if Lt knee xray done; ?reapply kinesiotape and educate pt to do himself. gait training, balance, add to HEP   Consulted and Agree with Plan  of Care Patient      Patient will benefit from skilled therapeutic intervention in order to improve the following deficits and impairments:  Abnormal gait, Cardiopulmonary status limiting activity, Decreased balance, Decreased knowledge of use of DME, Decreased mobility, Decreased strength, Difficulty walking, Impaired sensation, Pain  Visit Diagnosis: Muscle weakness (generalized)  Unsteadiness on feet  Acute pain of left knee     Problem List Patient Active Problem List   Diagnosis Date Noted  . Right basal ganglia embolic stroke ([place]) 03/50/0938  . Stroke (cerebrum) ([place]) 12/04/2016  . Tobacco use disorder   . Coronary artery disease   . GERD (gastroesophageal reflux disease)   . Hyperlipidemia   . Pleural effusion     Rexanne Mano, PT 01/14/2017, 10:58 AM  [facility] [address] [place], [place], [address] Phone: [phone]   Fax:  [phone]  Name: Brandon Martin MRN: [id number] Date of Birth: Aug 13, 1965  [facility] [address] [place], [place], [address] Phone: [phone]   Fax:  [phone]  Physical Therapy Treatment  Patient Details  Name: Brandon Martin MRN: [id number] Date of Birth: [birth year] Referring Provider: Alysia Penna  Encounter Date: 01/14/2017      PT End of Session - 01/14/17 1051    Visit Number 3   Number of Visits 17   Date for PT Re-Evaluation 02/25/17   Authorization Type UHC Medicare   Authorization Time Period 12/28/16 to 02/25/17   PT Start Time 0846   PT Stop Time 0931   PT Time Calculation (min) 45 min   Activity Tolerance Patient limited by pain   Behavior During Therapy [facility] for tasks assessed/performed      Past Medical History:  Diagnosis Date  . Coronary artery disease   . GERD (gastroesophageal reflux disease)   . Hyperlipidemia   . Hypertension   . Pleural effusion   . Tobacco use disorder     Past Surgical History:  Procedure Laterality Date  . CORONARY ARTERY BYPASS GRAFT  x 3    There were no vitals filed for this visit.      Subjective Assessment - 01/14/17 0846    Subjective Reports he did not know Dr. Letta Pate ordered an xray for his knee. States certain positions the pain is severe. States he wore a brace with "hinges on the side" while on CIR that decreased pain. Reports it was a loaned/clinic brace and he was not prescribed one on discharge.    Pertinent History Lt knee 8 surgeries due to crush injury    Currently in Pain? Yes   Pain Score 3    Pain Location Knee   Pain Orientation Left   Pain Descriptors / Indicators Stabbing   Pain Type Acute pain   Pain Onset More than a month ago            [facility] PT Assessment - 01/14/17 0001      Functional Gait  Assessment   Gait assessed  Yes   Gait Level Surface Walks 20 ft, slow speed, abnormal gait pattern, evidence for imbalance or deviates 10-15 in outside of the 12 in walkway width. Requires more  than 7 sec to ambulate 20 ft.   Change in Gait Speed Able to smoothly change walking speed without loss of balance or gait deviation. Deviate no more than 6 in outside of the 12 in walkway width.   Gait with Horizontal Head Turns Performs head turns smoothly with no change in gait. Deviates no more than 6 in outside 12 in walkway width   Gait with Vertical Head Turns Performs head turns with no change in gait. Deviates no more than 6 in outside 12 in walkway width.   Gait and Pivot Turn Pivot turns safely within 3 sec and stops quickly with no loss of balance.   Step Over Obstacle Is able to step over one shoe box (4.5 in total height) but must slow down and adjust steps to clear box safely. May require verbal cueing.   Gait with Narrow Base of Support Ambulates 4-7 steps.   Gait with Eyes Closed Walks 20 ft, uses assistive device, slower speed, mild gait deviations, deviates 6-10 in outside 12 in walkway width. Ambulates 20 ft in less than 9 sec but greater than 7 sec.  8.85 sec   Ambulating Backwards Walks 20 ft, no assistive devices, good speed, no evidence for imbalance, normal gait  independent with updated HEP for strengthening and balance. (TARGET for all LTGs: 02/25/17)   Time 8   Period Weeks   Status New     PT LONG TERM GOAL #2   Title Patient will be able to verbalize signs and symptoms of CVA and appropriate actions to take should he experience these symptoms.   Time 8   Period Weeks   Status New     PT LONG TERM GOAL #3   Title Patient will increase FGA score to >22/30 to reflect decreased fall risk.   Time 8   Period Weeks   Status New     PT LONG TERM GOAL #4   Title Patient will improve his "quick, but safe" gait velocity to >=4.4 ft/sec for safe crossing of street.    Time 8   Period Weeks   Status New               Plan - 01/14/17 1052    Clinical Impression Statement Patient with less knee pain at beginning of session, however with activity it quickly increased to 6/10. Informed pt re: order for knee xray and to call MD office to schedule. ? if a brace will provide support and decrease pain. Session focused on gait, balance, and strengthening with additions to HEP made. Patient is very motivated and anticipate good progress if Lt knee pain can be managed.    Rehab Potential Good   Clinical Impairments Affecting Rehab Potential severe lt patella pain   PT Frequency 2x / week   PT Duration 8 weeks   PT Treatment/Interventions ADLs/Self Care Home Management;DME Instruction;Gait training;Stair training;Functional mobility training;Therapeutic activities;Therapeutic exercise;Balance training;Neuromuscular re-education;Patient/family education;Orthotic Fit/Training;Manual techniques;Energy conservation;Taping   PT Next Visit Plan review HEP for LLE strengthening and balance from 7/6; check to see if Lt knee xray done; ?reapply kinesiotape and educate pt to do himself. gait training, balance, add to HEP   Consulted and Agree with Plan  of Care Patient      Patient will benefit from skilled therapeutic intervention in order to improve the following deficits and impairments:  Abnormal gait, Cardiopulmonary status limiting activity, Decreased balance, Decreased knowledge of use of DME, Decreased mobility, Decreased strength, Difficulty walking, Impaired sensation, Pain  Visit Diagnosis: Muscle weakness (generalized)  Unsteadiness on feet  Acute pain of left knee     Problem List Patient Active Problem List   Diagnosis Date Noted  . Right basal ganglia embolic stroke ([place]) 03/50/0938  . Stroke (cerebrum) ([place]) 12/04/2016  . Tobacco use disorder   . Coronary artery disease   . GERD (gastroesophageal reflux disease)   . Hyperlipidemia   . Pleural effusion     Rexanne Mano, PT 01/14/2017, 10:58 AM  [facility] [address] [place], [place], [address] Phone: [phone]   Fax:  [phone]  Name: Brandon Martin MRN: [id number] Date of Birth: Aug 13, 1965

## 2017-01-17 ENCOUNTER — Encounter: Payer: Self-pay | Admitting: Physical Therapy

## 2017-01-17 ENCOUNTER — Ambulatory Visit: Payer: Medicare Other | Admitting: Occupational Therapy

## 2017-01-17 ENCOUNTER — Ambulatory Visit: Payer: Medicare Other | Admitting: Physical Therapy

## 2017-01-17 ENCOUNTER — Encounter: Payer: Self-pay | Admitting: Occupational Therapy

## 2017-01-17 VITALS — BP 138/79

## 2017-01-17 DIAGNOSIS — R208 Other disturbances of skin sensation: Secondary | ICD-10-CM

## 2017-01-17 DIAGNOSIS — M25512 Pain in left shoulder: Secondary | ICD-10-CM

## 2017-01-17 DIAGNOSIS — R2681 Unsteadiness on feet: Secondary | ICD-10-CM

## 2017-01-17 DIAGNOSIS — M6281 Muscle weakness (generalized): Secondary | ICD-10-CM

## 2017-01-17 DIAGNOSIS — I69354 Hemiplegia and hemiparesis following cerebral infarction affecting left non-dominant side: Secondary | ICD-10-CM

## 2017-01-17 NOTE — Patient Instructions (Signed)
  Bridge [employer] small of back into mat and tighten stomach. Focus on engaging posterior hip muscles. Hold for __5__ seconds. Repeat __10__ times.  Copyright  VHI. All rights reserved.   Abductor Strength: Bridge Pose (Strap)   Make band wide enough to brace knees at hip width. Press into band with knees. Hold for __5__ seconds. Repeat __10__ times.  Copyright  VHI. All rights reserved.              Bridging: with Straight Leg Raise   With legs bent, lift buttocks __8__ inches from floor. Then slowly extend right knee, keeping stomach tight. Then lift left leg.  Repeat ___10_ times per set. Do __2__ sets per session.   http://orth.exer.us/1104   Copyright  VHI. All rights reserved.       Bridge Pose, One Leg   Lift up to bridge pose on both legs. Focus on engaging posterior hip muscles. Then lift one leg bringing knee towards your chin. Return foot down and lift other leg.  Repeat __10__ times each leg.  Copyright  VHI. All rights reserved.      Feet Apart (Compliant Surface) Head Motion - Eyes Open    With eyes open, standing on compliant surface: feet shoulder width apart, move head slowly: up and down  10, left and right x 10, diagonally x 10 (each direction) Do 1 sessions per day.  Copyright  VHI. All rights reserved.                        Feet Together (Compliant Surface) Head Motion - Eyes Open    With eyes open, standing on compliant surface: feet together, move head slowly: up and down  10, left and right x 10, diagonally x 10 (each direction) Do 1 sessions per day. Copyright  VHI. All rights reserved.                        Feet Partial Heel-Toe (Compliant Surface) Head Motion - Eyes Open    With eyes open, standing on compliant surface: right foot partially in front of the other, move head slowly: up and down  10, left and right x 10, diagonally x 10 (each direction) Do 1 sessions  per day. Copyright  VHI. All rights reserved.

## 2017-01-17 NOTE — Patient Instructions (Addendum)
1. Grip Strengthening (Resistive Putty)  YELLOW PUTTY.  Sit at a table and prop your forearm on the table. Try not to hike your shoulder when you do this.   Squeeze putty using thumb and all fingers. Repeat _15___ times. Do __2__ sessions per day.  If you get hand or shoulder pain STOP and let your therapist know.  Add this to your current arm exercises: 1. Sit at the table with your arms resting on the table. Hold something light in your hand, with your thumb point up toward the ceiling.  SLOWLY slide forward reaching for a target as far as you can go without pain. ONLY WORK WITHIN A PAIN FREE RANGE!!! Do 15, rest then do 15 more. Do 2 times per day.         Copyright  VHI. All rights reserved.

## 2017-01-17 NOTE — Therapy (Signed)
[facility] [address] [place], [place], [address] Phone: [phone]   Fax:  [phone]  Physical Therapy Treatment and Discharge Summary  Patient Details  Name: Brandon Martin MRN: [id number] Date of Birth: 05/15/1966 Referring Provider: Claudette Laws  Encounter Date: 01/17/2017      PT End of Session - 01/17/17 2127    Visit Number 4   Number of Visits 17   Date for PT Re-Evaluation 02/25/17   Authorization Type UHC Medicare   Authorization Time Period 12/28/16 to 02/25/17   PT Start Time 0801   PT Stop Time 0845   PT Time Calculation (min) 44 min   Activity Tolerance Patient limited by pain   Behavior During Therapy [facility] for tasks assessed/performed      Past Medical History:  Diagnosis Date  . Coronary artery disease   . GERD (gastroesophageal reflux disease)   . Hyperlipidemia   . Hypertension   . Pleural effusion   . Tobacco use disorder     Past Surgical History:  Procedure Laterality Date  . CORONARY ARTERY BYPASS GRAFT  x 3    There were no vitals filed for this visit.      Subjective Assessment - 01/17/17 0755    Subjective Decided not to call Dr. Wynn Banker re: xray as his knee is feeling better. He rested this weekend and it feels better. Reports he cannot afford to continue therapy at current frequency due to cost of copays. He is most concerned with continuing OT to work on LUE.    Pertinent History Lt knee 8 surgeries due to crush injury    Currently in Pain? No/denies   Pain Score 0-No pain   Pain Onset More than a month ago                         [facility] Adult PT Treatment/Exercise - 01/17/17 2116      Transfers   Transfers Sit to Stand;Stand to Sit   Sit to Stand 7: Independent;Without upper extremity assist  on blue airex foam   Stand to Sit 7: Independent   Number of Reps --  5; requested no more due to knee pain     Ambulation/Gait   Ambulation/Gait  Assistance 6: Modified independent (Device/Increase time)   Ambulation Distance (Feet) 100 Feet  50   Assistive device None   Gait Pattern Step-through pattern;Decreased arm swing - left;Decreased stance time - left;Decreased stride length;Decreased weight shift to left;Decreased trunk rotation;Wide base of support   Ambulation Surface Level     Lumbar Exercises: Supine   Bridge 10 reps;5 seconds     Knee/Hip Exercises: Standing   Knee Flexion Strengthening;Left;1 set;10 reps  red   Hip Abduction Stengthening;Both;1 set;10 reps;Knee straight  red band   Hip Extension Stengthening;Both;1 set;20 reps;Knee straight     Knee/Hip Exercises: Seated   Long Arc Quad Strengthening;Left;1 set;5 reps  red band   Abduction/Adduction  Strengthening;Both;1 set;10 reps  red band     Knee/Hip Exercises: Sidelying   Clams green band; bil x 10             Balance Exercises - 01/17/17 2124      Balance Exercises: Standing   Standing Eyes Opened Narrow base of support (BOS);Head turns;Foam/compliant surface  horizontal, vertical, diagonal x 10 reps each   Standing Eyes Closed Narrow base of support (BOS);Foam/compliant surface;20 secs;3 reps   Tandem Stance Eyes open;Intermittent upper extremity  support;3 reps   Rockerboard Anterior/posterior;Head turns;EO;Intermittent UE support   Balance Beam blue; marching; step off/on anteriorly and posteriorly           PT Education - 01/17/17 2127    Education provided Yes   Education Details how to progress bridging and balancre exercises added to HEP   Person(s) Educated Patient   Methods Explanation;Demonstration;Handout   Comprehension Verbalized understanding;Returned demonstration          PT Short Term Goals - 01/17/17 2128      PT SHORT TERM GOAL #1   Title Patient will be independent with HEP for strengthening and balance to reduce risk of falls. (TARGET for all STGs: 01/28/17)   Time 4   Period Weeks   Status Achieved      PT SHORT TERM GOAL #2   Title Patient will tolerate completion of FGA for baseline measure (if knee pain decreases)   Baseline 7/6 22/30   Time 4   Period Weeks   Status Achieved     PT SHORT TERM GOAL #3   Title Patient will complete TUG normal in <13.5 ft/sec to show decreased fall risk.    Time 4   Period Weeks   Status Unable to assess     PT SHORT TERM GOAL #4   Title Patient will improve gait velocity to >=3.07 ft/sec (norm for male 33-59 yo) towards LTG of >=4.4 ft/sec for safe crossing at crosswalk.   Time 4   Period Weeks   Status Unable to assess     PT SHORT TERM GOAL #5   Title Patient will verbalize understanding of fall prevention/safety measures he can apply to his home environment to reduce his risk of falls.    Time 4   Period Weeks   Status Unable to assess     PT SHORT TERM GOAL #6   Title Patient will achieve >=25 on FGA to demonstrate lesser fall risk.   Time 3   Period Weeks   Status Unable to assess           PT Long Term Goals - 01/17/17 2129      PT LONG TERM GOAL #1   Title Patient will be independent with updated HEP for strengthening and balance. (TARGET for all LTGs: 02/25/17)   Time 8   Period Weeks   Status Unable to assess     PT LONG TERM GOAL #2   Title Patient will be able to verbalize signs and symptoms of CVA and appropriate actions to take should he experience these symptoms.   Time 8   Period Weeks   Status Unable to assess     PT LONG TERM GOAL #3   Title Patient will increase FGA score to >22/30 to reflect decreased fall risk.   Time 8   Period Weeks   Status Unable to assess     PT LONG TERM GOAL #4   Title Patient will improve his "quick, but safe" gait velocity to >=4.4 ft/sec for safe crossing of street.    Time 8   Period Weeks   Status Unable to assess               Plan - 01/17/17 2130    Clinical Impression Statement Patient indicated at beginning of this session that this would be his last PT  session due to financial cost. Session focused on finalizing pt's HEP and how to advance exercises as they become too easy. Multiple measures for  STGs and LTGs deferred due to only pt's 3 treatment visit since evaluation. Pt did meet 2 of 6 STGs. Patient is being discharged from PT per patient's request.    Rehab Potential Good   Clinical Impairments Affecting Rehab Potential severe lt patella pain   PT Frequency 2x / week   PT Duration 8 weeks   PT Treatment/Interventions ADLs/Self Care Home Management;DME Instruction;Gait training;Stair training;Functional mobility training;Therapeutic activities;Therapeutic exercise;Balance training;Neuromuscular re-education;Patient/family education;Orthotic Fit/Training;Manual techniques;Energy conservation;Taping   Consulted and Agree with Plan of Care Patient      Patient will benefit from skilled therapeutic intervention in order to improve the following deficits and impairments:  Abnormal gait, Cardiopulmonary status limiting activity, Decreased balance, Decreased knowledge of use of DME, Decreased mobility, Decreased strength, Difficulty walking, Impaired sensation, Pain  Visit Diagnosis: Muscle weakness (generalized)  Hemiplegia and hemiparesis following cerebral infarction affecting left non-dominant side (HCC)       G-Codes - 2017/02/02 06-16-32    Functional Assessment Tool Used (Outpatient Only) Gait velocity 2.8 ft/sec (norm for males 50-59 is 3.07 ft/sec)   Functional Limitation Mobility: Walking and moving around   Mobility: Walking and Moving Around Goal Status [phone]) At least 1 percent but less than 20 percent impaired, limited or restricted   Mobility: Walking and Moving Around Discharge Status [phone]) At least 20 percent but less than 40 percent impaired, limited or restricted      Problem List Patient Active Problem List   Diagnosis Date Noted  . Right basal ganglia embolic stroke (HCC) 12/07/2016  . Stroke (cerebrum) (HCC)  12/04/2016  . Tobacco use disorder   . Coronary artery disease   . GERD (gastroesophageal reflux disease)   . Hyperlipidemia   . Pleural effusion    PHYSICAL THERAPY DISCHARGE SUMMARY  Visits from Start of Care: 4  Current functional level related to goals / functional outcomes: See above   Remaining deficits: Lt sided weakness; decr gait velocity; decr balance   Education / Equipment: HEP  Plan: Patient agrees to discharge.  Patient goals were partially met. Patient is being discharged due to financial reasons.  ?????        Zena Amos, PT 2017-02-02, 9:34 PM  [facility] [address] [place], [place], [address] Phone: [phone]   Fax:  [phone]  Name: Brandon Martin MRN: [id number] Date of Birth: [birth year]

## 2017-01-17 NOTE — Therapy (Signed)
Decreased mobility, Decreased range of motion, Difficulty walking, Decreased strength, Impaired sensation, Impaired tone, Impaired UE functional use, Pain  Visit Diagnosis: Muscle weakness (generalized)  Unsteadiness on feet  Hemiplegia and hemiparesis following cerebral infarction affecting left non-dominant side (HCC)  Other disturbances of skin sensation  Acute pain of left shoulder    Problem List Patient Active Problem List   Diagnosis Date Noted  . Right basal ganglia embolic stroke ([place]) 00/45/9977  . Stroke (cerebrum) ([place]) 12/04/2016  . Tobacco use disorder   . Coronary artery disease   . GERD (gastroesophageal reflux disease)   . Hyperlipidemia   . Pleural effusion     Quay Burow, OTR/L 01/17/2017, 1:06 PM  [place] [address] [place], [place], [address] Phone: [phone]   Fax:  [phone]  Name: RHONIN TROTT MRN: [id number] Date of Birth: 1965/08/02  [place] [address] [place], [place], [address] Phone: [phone]   Fax:  [phone]  Occupational Therapy Treatment  Patient Details  Name: SARAH ZERBY MRN: [id number] Date of Birth: Mar 14, 1966 Referring Provider: Dr. Danae Chen  Encounter Date: 01/17/2017      OT End of Session - 01/17/17 1305    Visit Number 5   Number of Visits 16   Date for OT Re-Evaluation 02/22/17   Authorization Type UHC no visit limitation no auth   OT Start Time [phone]   OT Stop Time 0930   OT Time Calculation (min) 44 min   Activity Tolerance Patient tolerated treatment well      Past Medical History:  Diagnosis Date  . Coronary artery disease   . GERD (gastroesophageal reflux disease)   . Hyperlipidemia   . Hypertension   . Pleural effusion   . Tobacco use disorder     Past Surgical History:  Procedure Laterality Date  . CORONARY ARTERY BYPASS GRAFT  x 3    Vitals:   01/17/17 0848  BP: 138/79        Subjective Assessment - 01/17/17 0850    Subjective  I do think  my shoulder is starting to feel better   Pertinent History see epic; Pt with R MCA CVA on 12/04/2016     Patient Stated Goals walk better and use  my arm   Currently in Pain? Yes   Pain Score 1    Pain Location Shoulder   Pain Orientation Left   Pain Descriptors / Indicators Stabbing   Pain Type Acute pain   Pain Onset More than a month ago   Pain Frequency Intermittent   Aggravating Factors  raising my arm   Pain Relieving Factors avoiding the movement.                       OT Treatments/Exercises (OP) - 01/17/17 0001      Neurological Re-education Exercises   Other Exercises 1 Neuro re ed in supine in closed chain activity to address shoulder press as well as overhead reach.  With PVC square pt able to do with just verbal cues and pt able to achieve approximately 120* of repetitive shoulder flexion in supine (closed chain) without  pain. Pt needs mod cues to slow movement down and to monitor shoulder hiking. Transitioned into sitting at table and addressed foward reach in sitting with arm supported on the table - pt reach to target and grasp then release and slide arm back. Pt is aware that he should  move slowly to avoid increasing tone as well as to prevent shoulder hiking . Added activity to HEP. Also added red theraputty to address grip strength and in hand gross manipulation.     Manual Therapy   Manual Therapy Joint mobilization;Soft tissue mobilization;Scapular mobilization   Manual therapy comments joint, soft tissue and scap mob to address shoulder pain, tightness and improve alignment in shoulder girdle prior to neuro re ed.                  OT Education - 01/17/17 1302    Education provided Yes   Education Details putty, added active table slide to HEP   Person(s) Educated Patient   Methods Explanation;Demonstration;Verbal cues;Handout   Comprehension Verbalized understanding;Returned demonstration          OT Short Term Goals - 01/17/17 1303      OT SHORT TERM  [place] [address] [place], [place], [address] Phone: [phone]   Fax:  [phone]  Occupational Therapy Treatment  Patient Details  Name: SARAH ZERBY MRN: [id number] Date of Birth: Mar 14, 1966 Referring Provider: Dr. Danae Chen  Encounter Date: 01/17/2017      OT End of Session - 01/17/17 1305    Visit Number 5   Number of Visits 16   Date for OT Re-Evaluation 02/22/17   Authorization Type UHC no visit limitation no auth   OT Start Time [phone]   OT Stop Time 0930   OT Time Calculation (min) 44 min   Activity Tolerance Patient tolerated treatment well      Past Medical History:  Diagnosis Date  . Coronary artery disease   . GERD (gastroesophageal reflux disease)   . Hyperlipidemia   . Hypertension   . Pleural effusion   . Tobacco use disorder     Past Surgical History:  Procedure Laterality Date  . CORONARY ARTERY BYPASS GRAFT  x 3    Vitals:   01/17/17 0848  BP: 138/79        Subjective Assessment - 01/17/17 0850    Subjective  I do think  my shoulder is starting to feel better   Pertinent History see epic; Pt with R MCA CVA on 12/04/2016     Patient Stated Goals walk better and use  my arm   Currently in Pain? Yes   Pain Score 1    Pain Location Shoulder   Pain Orientation Left   Pain Descriptors / Indicators Stabbing   Pain Type Acute pain   Pain Onset More than a month ago   Pain Frequency Intermittent   Aggravating Factors  raising my arm   Pain Relieving Factors avoiding the movement.                       OT Treatments/Exercises (OP) - 01/17/17 0001      Neurological Re-education Exercises   Other Exercises 1 Neuro re ed in supine in closed chain activity to address shoulder press as well as overhead reach.  With PVC square pt able to do with just verbal cues and pt able to achieve approximately 120* of repetitive shoulder flexion in supine (closed chain) without  pain. Pt needs mod cues to slow movement down and to monitor shoulder hiking. Transitioned into sitting at table and addressed foward reach in sitting with arm supported on the table - pt reach to target and grasp then release and slide arm back. Pt is aware that he should  move slowly to avoid increasing tone as well as to prevent shoulder hiking . Added activity to HEP. Also added red theraputty to address grip strength and in hand gross manipulation.     Manual Therapy   Manual Therapy Joint mobilization;Soft tissue mobilization;Scapular mobilization   Manual therapy comments joint, soft tissue and scap mob to address shoulder pain, tightness and improve alignment in shoulder girdle prior to neuro re ed.                  OT Education - 01/17/17 1302    Education provided Yes   Education Details putty, added active table slide to HEP   Person(s) Educated Patient   Methods Explanation;Demonstration;Verbal cues;Handout   Comprehension Verbalized understanding;Returned demonstration          OT Short Term Goals - 01/17/17 1303      OT SHORT TERM

## 2017-01-21 ENCOUNTER — Encounter: Payer: Self-pay | Admitting: Physical Medicine & Rehabilitation

## 2017-01-21 ENCOUNTER — Ambulatory Visit: Payer: Medicare Other | Admitting: Physical Therapy

## 2017-01-21 ENCOUNTER — Ambulatory Visit (HOSPITAL_BASED_OUTPATIENT_CLINIC_OR_DEPARTMENT_OTHER): Payer: Medicare Other | Admitting: Physical Medicine & Rehabilitation

## 2017-01-21 ENCOUNTER — Encounter: Payer: Medicare Other | Attending: Physical Medicine & Rehabilitation

## 2017-01-21 VITALS — BP 132/81 | HR 92

## 2017-01-21 DIAGNOSIS — Z8261 Family history of arthritis: Secondary | ICD-10-CM | POA: Insufficient documentation

## 2017-01-21 DIAGNOSIS — I6381 Other cerebral infarction due to occlusion or stenosis of small artery: Secondary | ICD-10-CM

## 2017-01-21 DIAGNOSIS — I10 Essential (primary) hypertension: Secondary | ICD-10-CM | POA: Insufficient documentation

## 2017-01-21 DIAGNOSIS — M7542 Impingement syndrome of left shoulder: Secondary | ICD-10-CM | POA: Diagnosis not present

## 2017-01-21 DIAGNOSIS — J439 Emphysema, unspecified: Secondary | ICD-10-CM | POA: Diagnosis not present

## 2017-01-21 DIAGNOSIS — I2510 Atherosclerotic heart disease of native coronary artery without angina pectoris: Secondary | ICD-10-CM | POA: Insufficient documentation

## 2017-01-21 DIAGNOSIS — E785 Hyperlipidemia, unspecified: Secondary | ICD-10-CM | POA: Diagnosis not present

## 2017-01-21 DIAGNOSIS — Z951 Presence of aortocoronary bypass graft: Secondary | ICD-10-CM | POA: Insufficient documentation

## 2017-01-21 DIAGNOSIS — Z8249 Family history of ischemic heart disease and other diseases of the circulatory system: Secondary | ICD-10-CM | POA: Diagnosis not present

## 2017-01-21 DIAGNOSIS — Z809 Family history of malignant neoplasm, unspecified: Secondary | ICD-10-CM | POA: Diagnosis not present

## 2017-01-21 DIAGNOSIS — F17200 Nicotine dependence, unspecified, uncomplicated: Secondary | ICD-10-CM | POA: Insufficient documentation

## 2017-01-21 DIAGNOSIS — I639 Cerebral infarction, unspecified: Secondary | ICD-10-CM | POA: Insufficient documentation

## 2017-01-21 DIAGNOSIS — K219 Gastro-esophageal reflux disease without esophagitis: Secondary | ICD-10-CM | POA: Insufficient documentation

## 2017-01-21 NOTE — Progress Notes (Signed)
Subjective:    Patient ID: Brandon Martin, male    DOB: Nov 17, 1965, 51 y.o.   MRN: [id number] 50 year old right-handed male, history of CAD with bypass grafting, hypertension, tobacco abuse.  Lives with brother independent prior to admission.  Admitted Dec 04, 2016, with left upper extremity weakness, facial droop, slurred speech.  CT- MRI showed acute right MCA territory infarct, nonhemorrhagic chronic right basal ganglia, lacunar infarction.  MRA negative.  The patient did not receive tPA.  Echocardiogram with ejection fraction of 60% grade 1 diastolic dysfunction.  Carotid Dopplers, no ICA stenosis.  Neurology consulted, placed on Plavix. HPI Seen by PCP increased GERD meds no other changes Seen by OP PT, no longer needs AD and has been d/ced Continues with OP OT  CC Pain Left shoulder hx of Rotator cuff disease but pain worse since CVA No falls since d/c , pt asking about slng No appt with Neuro  Patient does have appointment with his cardiologist.  Still smoking, but much less than prior to CVA Pain Inventory Average Pain 7 Pain Right Now 5 My pain is intermittent and stabbing  In the last 24 hours, has pain interfered with the following? General activity 3 Relation with others 0 Enjoyment of life 5 What TIME of day is your pain at its worst? night Sleep (in general) Fair  Pain is worse with: walking Pain improves with: heat/ice Relief from Meds: 4  Mobility walk without assistance ability to climb steps?  yes do you drive?  no  Function retired  Neuro/Psych numbness tingling spasms loss of taste or smell  Prior Studies Any changes since last visit?  no  Physicians involved in your care Any changes since last visit?  no   Family History  Problem Relation Age of Onset  . Hypertension Mother   . Arthritis Mother   . Cancer Father   . Transient ischemic attack Sister    Social History   Social History  . Marital status: Single    Spouse name:  N/A  . Number of children: N/A  . Years of education: N/A   Social History Main Topics  . Smoking status: Current Some Day Smoker    Types: Cigarettes    Last attempt to quit: 04/20/2011  . Smokeless tobacco: Former Neurosurgeon  . Alcohol use No  . Drug use: No  . Sexual activity: Not on file   Other Topics Concern  . Not on file   Social History Narrative  . No narrative on file   Past Surgical History:  Procedure Laterality Date  . CORONARY ARTERY BYPASS GRAFT  x 3   Past Medical History:  Diagnosis Date  . Coronary artery disease   . GERD (gastroesophageal reflux disease)   . Hyperlipidemia   . Hypertension   . Pleural effusion   . Tobacco use disorder    There were no vitals taken for this visit.  Opioid Risk Score:   Fall Risk Score:  `1  Depression screen PHQ 2/9  No flowsheet data found.   Review of Systems  Constitutional: Positive for diaphoresis and unexpected weight change.  HENT: Negative.   Eyes: Negative.   Respiratory: Negative.   Cardiovascular: Negative.   Gastrointestinal: Positive for constipation.  Endocrine: Negative.   Genitourinary: Negative.   Musculoskeletal: Positive for joint swelling.  Skin: Negative.   Allergic/Immunologic: Negative.   Neurological: Negative.   Hematological: Negative.   Psychiatric/Behavioral: Negative.   All other systems reviewed and are negative.  Objective:   Physical Exam  Constitutional: He is oriented to person, place, and time. He appears well-developed and well-nourished.  HENT:  Head: Normocephalic and atraumatic.  Eyes: Pupils are equal, round, and reactive to light. Conjunctivae and EOM are normal.  Neck: Normal range of motion.  Cardiovascular: Normal rate and regular rhythm.   No murmur heard. Pulmonary/Chest: Effort normal and breath sounds normal. No respiratory distress.  Abdominal: Soft. Bowel sounds are normal.  Musculoskeletal:       Left shoulder: He exhibits decreased range of  motion, deformity and decreased strength. He exhibits no tenderness.  Mild subluxation anterior inferior, one half finger breath. Pain with abduction at 90 No pain with external rotation.  Neurological: He is alert and oriented to person, place, and time. A sensory deficit is present. Gait normal.  Patient has reduced sensation light touch in the left upper limb. Motor strength is 3 minus at the deltoid, limited by pain. 4 minus at the biceps, triceps, finger flexors and extensors. Right upper extremity is 5/5 in deltoid, biceps, triceps, grip Bilateral lower extremities 5/5 in hip flexors, knee extensors, ankle dorsiflexors.  Psychiatric: He has a normal mood and affect.  Nursing note and vitals reviewed.         Assessment & Plan:  1. History of right basal ganglia infarct with residual left upper ext weakness. He has regained full mobility. However, his left upper extremity function is still diminished. Continue outpatient OT.  Referral made to neurology, Dr. Pearlean Brownie for secondary stroke prevention  Discussed smoking cessation, patient states he's cut down considerably  2. Left shoulder pain, pre-existing rotator cuff syndrome exacerbated by weakness from CVA. Pain is interfering with OT.  Will do subacromial injection today. May need to repeat in one month. If no relief, consider ultrasound-guided.  Shoulder injection left subacromial   Indication: Left Shoulder pain not relieved by medication management and other conservative care.  Informed consent was obtained after describing risks and benefits of the procedure with the patient, this includes bleeding, bruising, infection and medication side effects. The patient wishes to proceed and has given written consent. Patient was placed in a seated position. The left shoulder was marked and prepped with betadine in the subacromial area. A 25-gauge 1-1/2 inch needle was inserted into the subacromial area. After negative draw back for  blood, a solution containing 1 mL of 6 mg per ML betamethasone and 4 mL of 1% lidocaine was injected. A band aid was applied. The patient tolerated the procedure well. Post procedure instructions were given.

## 2017-01-21 NOTE — Patient Instructions (Signed)
You received a left subacromial bursa injection with Celestone and lidocaine. The medication should take full effect in 1-2 days. It may be repeated if needed. If it is not helpful, we may have to do this under ultrasound guidance.

## 2017-01-24 ENCOUNTER — Encounter: Payer: Self-pay | Admitting: Occupational Therapy

## 2017-01-24 ENCOUNTER — Ambulatory Visit: Payer: Medicare Other | Admitting: Physical Therapy

## 2017-01-24 ENCOUNTER — Ambulatory Visit: Payer: Medicare Other | Admitting: Occupational Therapy

## 2017-01-24 DIAGNOSIS — I69354 Hemiplegia and hemiparesis following cerebral infarction affecting left non-dominant side: Secondary | ICD-10-CM

## 2017-01-24 DIAGNOSIS — M25512 Pain in left shoulder: Secondary | ICD-10-CM

## 2017-01-24 DIAGNOSIS — M6281 Muscle weakness (generalized): Secondary | ICD-10-CM | POA: Diagnosis not present

## 2017-01-24 DIAGNOSIS — R208 Other disturbances of skin sensation: Secondary | ICD-10-CM

## 2017-01-24 DIAGNOSIS — R2681 Unsteadiness on feet: Secondary | ICD-10-CM

## 2017-01-24 NOTE — Therapy (Signed)
GERD (gastroesophageal reflux disease)   . Hyperlipidemia   . Pleural effusion     Quay Burow, OTR/L 01/24/2017, 10:59 AM  [facility] [address] [place], [place], [address] Phone: [phone]   Fax:  [phone]  Name: Brandon Martin MRN: [id number] Date of Birth: [birth year]  [facility] [address] [place], [place], [address] Phone: [phone]   Fax:  [phone]  Occupational Therapy Treatment  Patient Details  Name: Brandon Martin MRN: [id number] Date of Birth: January 28, 1966 Referring Provider: Dr. Danae Chen  Encounter Date: 01/24/2017      OT End of Session - 01/24/17 1058    Visit Number 6   Number of Visits 16   Date for OT Re-Evaluation 02/22/17   Authorization Type [employer] no visit limitation no auth   OT Start Time [phone]   OT Stop Time 0931   OT Time Calculation (min) 45 min   Activity Tolerance Patient tolerated treatment well      Past Medical History:  Diagnosis Date  . Coronary artery disease   . GERD (gastroesophageal reflux disease)   . Hyperlipidemia   . Hypertension   . Pleural effusion   . Tobacco use disorder     Past Surgical History:  Procedure Laterality Date  . CORONARY ARTERY BYPASS GRAFT  x 3    There were no vitals filed for this visit.      Subjective Assessment - 01/24/17 0851    Subjective  That shot in my shoulder really helped   Pertinent History see epic; Pt with R MCA CVA on 12/04/2016     Patient Stated Goals walk better and use  my arm   Currently in Pain? No/denies                      OT Treatments/Exercises (OP) - 01/24/17 0001      Neurological Re-education Exercises   Other Exercises 1 Pt s/p injection to L shoulder for pain mgmt and reports today that he has had no pain for the past 3 days.  Pt able today to engage in neuro re ed in closed chain activities to address LUE strength and proximal strength in supine with no pain. Upgraded HEP to include active reaching in supine with ball - see pt instructions for details.  Pt able to complete all reps without pain. Strongly encouraged pt to go slowly with emphasis on controlling the ball and work within pain limits. Pt verbalized understanding.  Also addressed composite  flexion/extension of L hand as well as reaching activity in standing with functional hand activity - pt needs min cues to use vision to compensate for sensory loss. Pt able to reach functionally to approximately 100* in standing with min faciliation to discourage shoulder hiking.                  OT Education - 01/24/17 1055    Education provided Yes   Education Details upgraded HEP for LUE   Person(s) Educated Patient   Methods Explanation;Demonstration;Verbal cues;Handout   Comprehension Verbalized understanding;Returned demonstration          OT Short Term Goals - 01/24/17 1056      OT SHORT TERM GOAL #1   Title Pt and family will be mod I with HEP - 01/25/2017   Status Achieved     OT SHORT TERM GOAL #2   Title PT will report no greater pain than 4/10 LUE with shoulder flexion to 65* of unilateral functional reach (baseline = 55 with 6/10 pain)   Status Achieved     OT SHORT TERM GOAL #3   Title Pt will demonstrate ability to pick up cylinderical object with low reach with LUE with min compensations.    Status Achieved  [facility] [address] [place], [place], [address] Phone: [phone]   Fax:  [phone]  Occupational Therapy Treatment  Patient Details  Name: Brandon Martin MRN: [id number] Date of Birth: January 28, 1966 Referring Provider: Dr. Danae Chen  Encounter Date: 01/24/2017      OT End of Session - 01/24/17 1058    Visit Number 6   Number of Visits 16   Date for OT Re-Evaluation 02/22/17   Authorization Type U[employer] no visit limitation no auth   OT Start Time [phone]   OT Stop Time 0931   OT Time Calculation (min) 45 min   Activity Tolerance Patient tolerated treatment well      Past Medical History:  Diagnosis Date  . Coronary artery disease   . GERD (gastroesophageal reflux disease)   . Hyperlipidemia   . Hypertension   . Pleural effusion   . Tobacco use disorder     Past Surgical History:  Procedure Laterality Date  . CORONARY ARTERY BYPASS GRAFT  x 3    There were no vitals filed for this visit.      Subjective Assessment - 01/24/17 0851    Subjective  That shot in my shoulder really helped   Pertinent History see epic; Pt with R MCA CVA on 12/04/2016     Patient Stated Goals walk better and use  my arm   Currently in Pain? No/denies                      OT Treatments/Exercises (OP) - 01/24/17 0001      Neurological Re-education Exercises   Other Exercises 1 Pt s/p injection to L shoulder for pain mgmt and reports today that he has had no pain for the past 3 days.  Pt able today to engage in neuro re ed in closed chain activities to address LUE strength and proximal strength in supine with no pain. Upgraded HEP to include active reaching in supine with ball - see pt instructions for details.  Pt able to complete all reps without pain. Strongly encouraged pt to go slowly with emphasis on controlling the ball and work within pain limits. Pt verbalized understanding.  Also addressed composite  flexion/extension of L hand as well as reaching activity in standing with functional hand activity - pt needs min cues to use vision to compensate for sensory loss. Pt able to reach functionally to approximately 100* in standing with min faciliation to discourage shoulder hiking.                  OT Education - 01/24/17 1055    Education provided Yes   Education Details upgraded HEP for LUE   Person(s) Educated Patient   Methods Explanation;Demonstration;Verbal cues;Handout   Comprehension Verbalized understanding;Returned demonstration          OT Short Term Goals - 01/24/17 1056      OT SHORT TERM GOAL #1   Title Pt and family will be mod I with HEP - 01/25/2017   Status Achieved     OT SHORT TERM GOAL #2   Title PT will report no greater pain than 4/10 LUE with shoulder flexion to 65* of unilateral functional reach (baseline = 55 with 6/10 pain)   Status Achieved     OT SHORT TERM GOAL #3   Title Pt will demonstrate ability to pick up cylinderical object with low reach with LUE with min compensations.    Status Achieved

## 2017-01-24 NOTE — Patient Instructions (Signed)
Upgraded home program for your left arm:  Do these exercises 2 times per day, morning and night! STOP if you get pain and let your therapist know.   1. Lay on your back and hold the ball between your hands. Start with ball on your chest. Slowly raise ball up toward the ceiling until elbows are straight then slowly return ball to chest.  Do 12, rest then do 12 more.  2. Lay on your back and hold the ball between your hands. Raise the ball toward the ceiling until elbows are straight. KEEPING ELBOWS STRAIGHT, slowly bring ball over your head as far as you can with no pain and good control of the ball. Bring ball back until it is over your chest again, keeping elbows straight the entire time.  Do 12, rest then do 12 more.

## 2017-01-27 ENCOUNTER — Ambulatory Visit: Payer: Medicare Other | Admitting: Occupational Therapy

## 2017-01-27 ENCOUNTER — Ambulatory Visit: Payer: Medicare Other | Admitting: Physical Therapy

## 2017-01-27 ENCOUNTER — Encounter: Payer: Self-pay | Admitting: Occupational Therapy

## 2017-01-27 DIAGNOSIS — M6281 Muscle weakness (generalized): Secondary | ICD-10-CM

## 2017-01-27 DIAGNOSIS — R208 Other disturbances of skin sensation: Secondary | ICD-10-CM

## 2017-01-27 DIAGNOSIS — R2681 Unsteadiness on feet: Secondary | ICD-10-CM

## 2017-01-27 DIAGNOSIS — M25512 Pain in left shoulder: Secondary | ICD-10-CM

## 2017-01-27 DIAGNOSIS — I69354 Hemiplegia and hemiparesis following cerebral infarction affecting left non-dominant side: Secondary | ICD-10-CM

## 2017-01-27 NOTE — Therapy (Signed)
[facility] [address] [place], [place], [address] Phone: [phone]   Fax:  [phone]  Occupational Therapy Treatment  Patient Details  Name: Brandon Martin MRN: [id number] Date of Birth: 12-Feb-1966 Referring Provider: Dr. Rodman Pickle  Encounter Date: 01/27/2017      OT End of Session - 01/27/17 1234    Visit Number 7   Number of Visits 16   Date for OT Re-Evaluation 02/22/17   Authorization Type UHC no visit limitation no auth   OT Start Time 0932   OT Stop Time 1015   OT Time Calculation (min) 43 min   Activity Tolerance Patient tolerated treatment well      Past Medical History:  Diagnosis Date  . Coronary artery disease   . GERD (gastroesophageal reflux disease)   . Hyperlipidemia   . Hypertension   . Pleural effusion   . Tobacco use disorder     Past Surgical History:  Procedure Laterality Date  . CORONARY ARTERY BYPASS GRAFT  x 3    There were no vitals filed for this visit.      Subjective Assessment - 01/27/17 0936    Subjective  I still don't have any pain in that shoulder   Pertinent History see epic; Pt with R MCA CVA on 12/04/2016     Patient Stated Goals walk better and use  my arm   Currently in Pain? No/denies                      OT Treatments/Exercises (OP) - 01/27/17 0001      ADLs   Eating Pt issued red foam to use on fork for cutting meat - with cues and practice pt able to do mod I with extra time.  Pt to practice at home.       Neurological Re-education Exercises   Other Exercises 1 Neuro re ed to address bilateral closed chain chest press and overhead reach in supine progressing to unilateral reach in supine.  Pt initially required min faciliation for scap depression however with repetition and muscle recruitment able to complete several repetitions without facilitation in various planes. Transitioned into sitting  and addressed low to mid reach with L grasp  activity with intermittent faciltiation                  OT Short Term Goals - 01/27/17 1229      OT SHORT TERM GOAL #1   Title Pt and family will be mod I with HEP - 01/25/2017   Status Achieved     OT SHORT TERM GOAL #2   Title PT will report no greater pain than 4/10 LUE with shoulder flexion to 65* of unilateral functional reach (baseline = 55 with 6/10 pain)   Status Achieved     OT SHORT TERM GOAL #3   Title Pt will demonstrate ability to pick up cylinderical object with low reach with LUE with min compensations.    Status Achieved     OT SHORT TERM GOAL #4   Title Pt will demonstrate ability to release cyndrical object with low reach with no more than min facilitation.    Status Achieved     OT SHORT TERM GOAL #5   Title Pt will demonstrate understanding of at least 2 tone reduction techniques in order to prep hand for functional use.    Status Deferred     OT SHORT TERM GOAL #6   Title Pt  will be mod I with shower transfers   Status Achieved     OT SHORT TERM GOAL #7   Title Pt will be mod I using both hands to cut food during eating.   Status Achieved  using red foam           OT Long Term Goals - 01/27/17 1229      OT LONG TERM GOAL #1   Title Pt and family will be mod I with upgraded HEP - 02/22/2017   Status On-going     OT LONG TERM GOAL #2   Title Pt will demonstrate ability for mid reach to lift light object off shelf with LUE with min compensations and pain no greater than 2/10   Status On-going     OT LONG TERM GOAL #3   Title Pt will demonstrate ability to pick up small object with either 2 pt or 3 pt pinch   Status On-going     OT LONG TERM GOAL #4   Title Pt will demonstrate ability to walk on uneven outdoor surfaces while carrying object in LUE in prep for returning to yard work.    Status On-going     OT LONG TERM GOAL #5   Title Pt will demonstrate ability for bilateral low reach to pick up 4 pound object during functional  task.    Status On-going  based on new info from pt that pt had significant shoulder pain prior to stroke and likely rotator cuff tear               Plan - 01/27/17 1230    Clinical Impression Statement Pt continues to progress toward goals and has met all STG's.  Pt only experiencing intermittent shoulder pain with reach over 100* in supine and 90* in sitting in closed chain   Rehab Potential Good   OT Frequency 2x / week   OT Duration 8 weeks   OT Treatment/Interventions Self-care/ADL training;Moist Heat;Electrical Stimulation;Cryotherapy;DME and/or AE instruction;Neuromuscular education;Therapeutic exercise;Functional Mobility Training;Manual Therapy;Passive range of motion;Therapeutic activities;Patient/family education;Balance training   Plan continue to address L shoulder pair prn, NMR for LUE in supine and in sitting to mid reach as well as in standing, NMR for trunk, balance and increased activity in LLE in standing.    Consulted and Agree with Plan of Care Patient      Patient will benefit from skilled therapeutic intervention in order to improve the following deficits and impairments:  Abnormal gait, Decreased balance, Decreased coordination, Decreased knowledge of use of DME, Decreased mobility, Decreased range of motion, Difficulty walking, Decreased strength, Impaired sensation, Impaired tone, Impaired UE functional use, Pain  Visit Diagnosis: Muscle weakness (generalized)  Hemiplegia and hemiparesis following cerebral infarction affecting left non-dominant side (HCC)  Unsteadiness on feet  Other disturbances of skin sensation  Acute pain of left shoulder    Problem List Patient Active Problem List   Diagnosis Date Noted  . Subacromial impingement of left shoulder 01/21/2017  . Right basal ganglia embolic stroke (HCC) 12/07/2016  . Basal ganglia infarction (HCC) 12/04/2016  . Tobacco use disorder   . Coronary artery disease   . GERD (gastroesophageal reflux  disease)   . Hyperlipidemia   . Pleural effusion     Norton Pastel, OTR/L 01/27/2017, 12:35 PM  [facility] [address] [place], [place], [address] Phone: [phone]   Fax:  [phone]  Name: Brandon Martin MRN: [id number] Date of Birth: September 09, 1965

## 2017-02-01 ENCOUNTER — Ambulatory Visit: Payer: Medicare Other | Admitting: Physical Therapy

## 2017-02-01 ENCOUNTER — Ambulatory Visit: Payer: Medicare Other | Admitting: Occupational Therapy

## 2017-02-01 DIAGNOSIS — M6281 Muscle weakness (generalized): Secondary | ICD-10-CM | POA: Diagnosis not present

## 2017-02-01 DIAGNOSIS — I69354 Hemiplegia and hemiparesis following cerebral infarction affecting left non-dominant side: Secondary | ICD-10-CM

## 2017-02-01 DIAGNOSIS — M25512 Pain in left shoulder: Secondary | ICD-10-CM

## 2017-02-01 NOTE — Therapy (Signed)
motion, Difficulty walking, Decreased strength, Impaired sensation, Impaired tone, Impaired UE functional use, Pain  Visit Diagnosis: Hemiplegia and hemiparesis following cerebral infarction affecting left non-dominant side (HCC)  Acute pain of left shoulder  Muscle weakness (generalized)    Problem List Patient Active Problem List   Diagnosis Date Noted  . Subacromial impingement of left shoulder 01/21/2017  . Right basal ganglia embolic stroke ([facility]) 65/46/5035  . Basal ganglia infarction ([facility]) 12/04/2016  . Tobacco use disorder   . Coronary artery disease   . GERD (gastroesophageal reflux disease)   . Hyperlipidemia   . Pleural effusion     Carey Bullocks, OTR/L 02/01/2017, 10:42 AM  [facility] [address] [place], [place], [address] Phone: [phone]   Fax:  [phone]  Name: Brandon Martin MRN: [id number] Date of Birth: 1965-11-05  [facility] [address] [place], [place], [address] Phone: [phone]   Fax:  [phone]  Occupational Therapy Treatment  Patient Details  Name: DIMITRIS SHANAHAN MRN: [id number] Date of Birth: 06/03/1966 Referring Provider: Dr. Danae Chen  Encounter Date: 02/01/2017      OT End of Session - 02/01/17 1038    Visit Number 8   Number of Visits 16   Date for OT Re-Evaluation 02/22/17   Authorization Type [employer] no visit limitation no auth   OT Start Time [phone]   OT Stop Time 0932   OT Time Calculation (min) 45 min   Activity Tolerance Patient tolerated treatment well      Past Medical History:  Diagnosis Date  . Coronary artery disease   . GERD (gastroesophageal reflux disease)   . Hyperlipidemia   . Hypertension   . Pleural effusion   . Tobacco use disorder     Past Surgical History:  Procedure Laterality Date  . CORONARY ARTERY BYPASS GRAFT  x 3    There were no vitals filed for this visit.      Subjective Assessment - 02/01/17 0851    Subjective  No pain entering clinic today, however pt reports his arm was so bad 3 days ago, he was ready "to cut it off". Pt had pain up to 5/10 at the highest during therapy, but would resolve with faciltation and repetition   Pertinent History see epic; Pt with R MCA CVA on 12/04/2016     Patient Stated Goals walk better and use  my arm   Currently in Pain? Yes   Pain Score 5    Pain Location Shoulder   Pain Orientation Left   Pain Descriptors / Indicators Aching;Sore   Pain Type Acute pain   Pain Onset More than a month ago   Pain Frequency Intermittent   Aggravating Factors  raising arm   Pain Relieving Factors avoiding movement            [facility] OT Assessment - 02/01/17 0001      Observation/Other Assessments   Observations Pt point tender at distal biceps and proximal attachment of biceps at shoulder. ? some tendonitis. Pt also has upper trap tightness  secondary to compensating with shoulder hiking. Emphasis placed on posture and sitting up coming from lower trunk and pelvis vs. hiking shoulders.                   OT Treatments/Exercises (OP) - 02/01/17 0001      Neurological Re-education Exercises   Other Exercises 1 Began in supine with ball for chest press motion bilaterally then going into slighter higher sh. flexion, but with initial pain however resolved with facilitation and reps. Moved into sidelying for AA/ROM in sh. flexion/ext with therapist faciltating scapula LUE - Pt initially could only tolerate midrange to slightly higher, but after facilitation and reps, pt could go into high level flexion. Returned to supine for same ex with ball and pt able to achieve high level sh. flexion bilaterally   Other Exercises 2 Progressed to seated for initial correction of posture and worked in low to midrange AA/ROM with facilitation, pt then able to progress to AA/ROM in mid to high level ranges with min assist LUE                  OT Short Term Goals - 01/27/17 1229      OT SHORT TERM GOAL #1   Title  motion, Difficulty walking, Decreased strength, Impaired sensation, Impaired tone, Impaired UE functional use, Pain  Visit Diagnosis: Hemiplegia and hemiparesis following cerebral infarction affecting left non-dominant side (HCC)  Acute pain of left shoulder  Muscle weakness (generalized)    Problem List Patient Active Problem List   Diagnosis Date Noted  . Subacromial impingement of left shoulder 01/21/2017  . Right basal ganglia embolic stroke ([facility]) 65/46/5035  . Basal ganglia infarction ([facility]) 12/04/2016  . Tobacco use disorder   . Coronary artery disease   . GERD (gastroesophageal reflux disease)   . Hyperlipidemia   . Pleural effusion     Carey Bullocks, OTR/L 02/01/2017, 10:42 AM  [facility] [address] [place], [place], [address] Phone: [phone]   Fax:  [phone]  Name: Brandon Martin MRN: [id number] Date of Birth: 1965-11-05

## 2017-02-03 ENCOUNTER — Ambulatory Visit: Payer: Medicare Other | Admitting: Occupational Therapy

## 2017-02-03 ENCOUNTER — Ambulatory Visit: Payer: Medicare Other | Admitting: Physical Therapy

## 2017-02-03 DIAGNOSIS — I69354 Hemiplegia and hemiparesis following cerebral infarction affecting left non-dominant side: Secondary | ICD-10-CM

## 2017-02-03 DIAGNOSIS — M25512 Pain in left shoulder: Secondary | ICD-10-CM

## 2017-02-03 DIAGNOSIS — M6281 Muscle weakness (generalized): Secondary | ICD-10-CM

## 2017-02-03 NOTE — Therapy (Signed)
impairments:  Abnormal gait, Decreased balance, Decreased coordination, Decreased knowledge of use of DME, Decreased mobility, Decreased range of motion, Difficulty walking, Decreased strength, Impaired sensation, Impaired tone, Impaired UE functional use, Pain  Visit Diagnosis: Hemiplegia and hemiparesis following cerebral infarction affecting left non-dominant side (HCC)  Acute pain of left shoulder  Muscle weakness (generalized)    Problem List Patient Active Problem List   Diagnosis Date Noted  . Subacromial impingement of left shoulder 01/21/2017  . Right basal ganglia embolic stroke ([place]) 35/45/6256  . Basal ganglia infarction ([place]) 12/04/2016  . Tobacco use disorder   . Coronary artery disease   . GERD (gastroesophageal reflux disease)   . Hyperlipidemia   . Pleural effusion     Carey Bullocks, OTR/L 02/03/2017, 12:14 PM  [facility] [address] [place], [place], [address] Phone: [phone]   Fax:  [phone]  Name: Brandon Martin MRN: [id number] Date of Birth: [birth year]  [facility] [address] [place], [place], [address] Phone: [phone]   Fax:  [phone]  Occupational Therapy Treatment  Patient Details  Name: Brandon Martin MRN: [id number] Date of Birth: 1965/09/16 Referring Provider: Dr. Danae Chen  Encounter Date: 02/03/2017      OT End of Session - 02/03/17 1210    Visit Number 9   Number of Visits 16   Date for OT Re-Evaluation 02/22/17   Authorization Type UHC no visit limitation no auth - G code required every 10 visits   Authorization - Visit Number 9   Authorization - Number of Visits 10   OT Start Time 1015   OT Stop Time 1100   OT Time Calculation (min) 45 min   Activity Tolerance Patient tolerated treatment well      Past Medical History:  Diagnosis Date  . Coronary artery disease   . GERD (gastroesophageal reflux disease)   . Hyperlipidemia   . Hypertension   . Pleural effusion   . Tobacco use disorder     Past Surgical History:  Procedure Laterality Date  . CORONARY ARTERY BYPASS GRAFT  x 3    There were no vitals filed for this visit.      Subjective Assessment - 02/03/17 1201    Subjective  I brought the tape and would like if you taped it today   Pertinent History see epic; Pt with R MCA CVA on 12/04/2016     Patient Stated Goals walk better and use  my arm   Currently in Pain? Yes   Pain Score 5    Pain Location Shoulder   Pain Orientation Left   Pain Descriptors / Indicators Aching;Sore   Pain Type Acute pain   Pain Onset More than a month ago   Pain Frequency Intermittent   Aggravating Factors  certain positions   Pain Relieving Factors proper positioning                      OT Treatments/Exercises (OP) - 02/03/17 0001      Neurological Re-education Exercises   Other Exercises 1 Began sidelying for AA/ROM in high level sh. flex/ext. Pt able to achieve high level flexion with min facilitation to scapula at beginning of  session today (vs. last session required stretching/ max facilitation to scapula, and reps before achieving). Progressed to supine for high level sh. flexion/extension with ball. Pt had no pain with either of these activities.    Other Exercises 2 Seated: closed chain mid to high level reaching with cues for posture and to prevent shoulder hiking and IR. Pt would correct IR by hiking shoulder d/t weakness in scapula depression. Worked on stretching BUE's in sh. extension and ER rotation as tolerated - pt had to progress slowly with tactile cueing d/t pain and tightness at pects. Progressed to sh. abduction with lateral trunk flexion attempting at mid level, but due to pain, lowered to performing on ball with mod facilitation provided at trunk/scapula. Pain resolved     Manual Therapy   Manual Therapy Passive ROM   Manual therapy comments Kinesiotape applied to inhibit/relax biceps and upper traps. Reviewed wear and care. May benefit from taping to relax pects major/minor, and to faciltate trunk extension and scapula depression   Passive ROM Stretching to pects by gently stretching shoulder into gentle extension with scapula retraction and depression. Progressed to holding arm in low ER while pt actively rotated trunk away from arm  [facility] [address] [place], [place], [address] Phone: [phone]   Fax:  [phone]  Occupational Therapy Treatment  Patient Details  Name: Brandon Martin MRN: [id number] Date of Birth: 1965/09/16 Referring Provider: Dr. Danae Chen  Encounter Date: 02/03/2017      OT End of Session - 02/03/17 1210    Visit Number 9   Number of Visits 16   Date for OT Re-Evaluation 02/22/17   Authorization Type UHC no visit limitation no auth - G code required every 10 visits   Authorization - Visit Number 9   Authorization - Number of Visits 10   OT Start Time 1015   OT Stop Time 1100   OT Time Calculation (min) 45 min   Activity Tolerance Patient tolerated treatment well      Past Medical History:  Diagnosis Date  . Coronary artery disease   . GERD (gastroesophageal reflux disease)   . Hyperlipidemia   . Hypertension   . Pleural effusion   . Tobacco use disorder     Past Surgical History:  Procedure Laterality Date  . CORONARY ARTERY BYPASS GRAFT  x 3    There were no vitals filed for this visit.      Subjective Assessment - 02/03/17 1201    Subjective  I brought the tape and would like if you taped it today   Pertinent History see epic; Pt with R MCA CVA on 12/04/2016     Patient Stated Goals walk better and use  my arm   Currently in Pain? Yes   Pain Score 5    Pain Location Shoulder   Pain Orientation Left   Pain Descriptors / Indicators Aching;Sore   Pain Type Acute pain   Pain Onset More than a month ago   Pain Frequency Intermittent   Aggravating Factors  certain positions   Pain Relieving Factors proper positioning                      OT Treatments/Exercises (OP) - 02/03/17 0001      Neurological Re-education Exercises   Other Exercises 1 Began sidelying for AA/ROM in high level sh. flex/ext. Pt able to achieve high level flexion with min facilitation to scapula at beginning of  session today (vs. last session required stretching/ max facilitation to scapula, and reps before achieving). Progressed to supine for high level sh. flexion/extension with ball. Pt had no pain with either of these activities.    Other Exercises 2 Seated: closed chain mid to high level reaching with cues for posture and to prevent shoulder hiking and IR. Pt would correct IR by hiking shoulder d/t weakness in scapula depression. Worked on stretching BUE's in sh. extension and ER rotation as tolerated - pt had to progress slowly with tactile cueing d/t pain and tightness at pects. Progressed to sh. abduction with lateral trunk flexion attempting at mid level, but due to pain, lowered to performing on ball with mod facilitation provided at trunk/scapula. Pain resolved     Manual Therapy   Manual Therapy Passive ROM   Manual therapy comments Kinesiotape applied to inhibit/relax biceps and upper traps. Reviewed wear and care. May benefit from taping to relax pects major/minor, and to faciltate trunk extension and scapula depression   Passive ROM Stretching to pects by gently stretching shoulder into gentle extension with scapula retraction and depression. Progressed to holding arm in low ER while pt actively rotated trunk away from arm

## 2017-02-07 ENCOUNTER — Ambulatory Visit: Payer: Medicare Other | Admitting: Physical Therapy

## 2017-02-07 ENCOUNTER — Encounter: Payer: Self-pay | Admitting: Occupational Therapy

## 2017-02-07 ENCOUNTER — Ambulatory Visit: Payer: Medicare Other | Admitting: Occupational Therapy

## 2017-02-07 DIAGNOSIS — M25512 Pain in left shoulder: Secondary | ICD-10-CM

## 2017-02-07 DIAGNOSIS — M6281 Muscle weakness (generalized): Secondary | ICD-10-CM | POA: Diagnosis not present

## 2017-02-07 DIAGNOSIS — R2681 Unsteadiness on feet: Secondary | ICD-10-CM

## 2017-02-07 DIAGNOSIS — R208 Other disturbances of skin sensation: Secondary | ICD-10-CM

## 2017-02-07 DIAGNOSIS — I69354 Hemiplegia and hemiparesis following cerebral infarction affecting left non-dominant side: Secondary | ICD-10-CM

## 2017-02-07 NOTE — Therapy (Signed)
L shoulder pain at this time for standardized tests.   Functional Limitation Carrying, moving and handling objects   Carrying, Moving and Handling Objects Current Status [phone]) At least 60 percent but less than 80 percent impaired, limited or restricted   Carrying, Moving and Handling Objects Goal Status ([id number]) At least 40 percent but less than 60 percent impaired, limited or restricted      Problem List Patient Active Problem List   Diagnosis Date Noted  . Subacromial impingement of left shoulder 01/21/2017  . Right basal ganglia embolic stroke ([place]) 09/38/1829  . Basal ganglia infarction ([place]) 12/04/2016  . Tobacco use disorder   . Coronary artery disease   . GERD (gastroesophageal reflux disease)   . Hyperlipidemia   . Pleural effusion     Quay Burow, OTR/L 02/07/2017, 9:02 AM  [facility] [address] [place], [place], [address] Phone: [phone]   Fax:  [phone]  Name: Brandon Martin MRN: [id number] Date of Birth: 07/07/1966  [facility] [address] [place], [place], [address] Phone: [phone]   Fax:  [phone]  Occupational Therapy Treatment  Patient Details  Name: Brandon Martin MRN: [id number] Date of Birth: Oct 06, 1965 Referring Provider: Dr. Danae Chen  Encounter Date: 02/07/2017      OT End of Session - 02/07/17 0900    Visit Number 10   Number of Visits 16   Date for OT Re-Evaluation 02/22/17   Authorization Type UHC no visit limitation no auth - G code required every 10 visits   Authorization - Visit Number 10   Authorization - Number of Visits 20   OT Start Time 0801   OT Stop Time 6962   OT Time Calculation (min) 46 min   Activity Tolerance Patient tolerated treatment well      Past Medical History:  Diagnosis Date  . Coronary artery disease   . GERD (gastroesophageal reflux disease)   . Hyperlipidemia   . Hypertension   . Pleural effusion   . Tobacco use disorder     Past Surgical History:  Procedure Laterality Date  . CORONARY ARTERY BYPASS GRAFT  x 3    There were no vitals filed for this visit.      Subjective Assessment - 02/07/17 0803    Subjective  If I overwork it my shoulder can hurt - last weekend I had to cut up a tree that fell in my yard so it hurt last week.    Pertinent History see epic; Pt with R MCA CVA on 12/04/2016     Patient Stated Goals walk better and use  my arm   Currently in Pain? No/denies  if I raise it too much it still hurts                      OT Treatments/Exercises (OP) - 02/07/17 0001      Neurological Re-education Exercises   Other Exercises 1 Neuro re ed in supine and prone to address scapular stability and strengthening to improve active scapular depression as well as ER to allow for overhead reach with reduced pain. Pt continues to c/o pain with shoulder flexion greater than 90* in unilateral reach and above 115* in closed chain activity for  bilateral  overhead reach.  Pt needs max cues to monitor shoulder hiking and learning to the right with all movement. Pt achieves less pain during the session here in the clinic however uses arm in patterns when at home that aggravate painful movements.  Pt did achieve full shoulder flexion in supine in closed with 6 pound weight as well as 3 pound weight in unilateral reach in supine.  At end of session pt with improved mid reach with no pain and beginning high reach (unialteral) with minimal pain. Also addressed in standing with forced paradaigm to encourage active weight bearing on LLE as well as more active trunk.                  OT Short Term Goals - 02/07/17 9528      OT SHORT TERM GOAL #1   Title Pt and family will be mod I with HEP - 01/25/2017   Status Achieved     OT SHORT TERM GOAL #2   Title PT will report no greater pain than 4/10 LUE with shoulder flexion to 65* of unilateral functional reach (baseline = 55 with 6/10 pain)   Status Achieved     OT SHORT  L shoulder pain at this time for standardized tests.   Functional Limitation Carrying, moving and handling objects   Carrying, Moving and Handling Objects Current Status [phone]) At least 60 percent but less than 80 percent impaired, limited or restricted   Carrying, Moving and Handling Objects Goal Status (V0350) At least 40 percent but less than 60 percent impaired, limited or restricted      Problem List Patient Active Problem List   Diagnosis Date Noted  . Subacromial impingement of left shoulder 01/21/2017  . Right basal ganglia embolic stroke ([place]) 09/38/1829  . Basal ganglia infarction ([place]) 12/04/2016  . Tobacco use disorder   . Coronary artery disease   . GERD (gastroesophageal reflux disease)   . Hyperlipidemia   . Pleural effusion     Quay Burow, OTR/L 02/07/2017, 9:02 AM  [facility] [address] [place], [place], [address] Phone: [phone]   Fax:  [phone]  Name: Brandon Martin MRN: [id number] Date of Birth: 07/07/1966

## 2017-02-10 ENCOUNTER — Ambulatory Visit: Payer: Medicare Other | Attending: Physical Medicine & Rehabilitation | Admitting: Occupational Therapy

## 2017-02-10 ENCOUNTER — Ambulatory Visit: Payer: Medicare Other | Admitting: Physical Therapy

## 2017-02-10 DIAGNOSIS — R2681 Unsteadiness on feet: Secondary | ICD-10-CM | POA: Insufficient documentation

## 2017-02-10 DIAGNOSIS — R208 Other disturbances of skin sensation: Secondary | ICD-10-CM | POA: Insufficient documentation

## 2017-02-10 DIAGNOSIS — I69354 Hemiplegia and hemiparesis following cerebral infarction affecting left non-dominant side: Secondary | ICD-10-CM

## 2017-02-10 DIAGNOSIS — M6281 Muscle weakness (generalized): Secondary | ICD-10-CM

## 2017-02-10 DIAGNOSIS — M25512 Pain in left shoulder: Secondary | ICD-10-CM | POA: Insufficient documentation

## 2017-02-10 NOTE — Therapy (Signed)
[facility] [address] [place], [place], [address] Phone: [phone]   Fax:  [phone]  Occupational Therapy Treatment  Patient Details  Name: Brandon Martin MRN: [id number] Date of Birth: 04-Dec-1965 Referring Provider: Dr. Rodman Pickle  Encounter Date: 02/10/2017      OT End of Session - 02/10/17 0827    Visit Number 11   Number of Visits 16   Date for OT Re-Evaluation 02/22/17   Authorization Type UHC no visit limitation no auth - G code required every 10 visits   Authorization - Visit Number 11   Authorization - Number of Visits 20   OT Start Time 0803   OT Stop Time 0845   OT Time Calculation (min) 42 min   Activity Tolerance Patient tolerated treatment well   Behavior During Therapy [facility] for tasks assessed/performed      Past Medical History:  Diagnosis Date  . Coronary artery disease   . GERD (gastroesophageal reflux disease)   . Hyperlipidemia   . Hypertension   . Pleural effusion   . Tobacco use disorder     Past Surgical History:  Procedure Laterality Date  . CORONARY ARTERY BYPASS GRAFT  x 3    There were no vitals filed for this visit.      Subjective Assessment - 02/10/17 0825    Pertinent History see epic; Pt with R MCA CVA on 12/04/2016     Currently in Pain? Yes   Pain Score 4    Pain Orientation Left   Pain Descriptors / Indicators Aching   Pain Type Acute pain   Pain Onset More than a month ago   Pain Frequency Intermittent   Aggravating Factors  certain positions   Pain Relieving Factors repositioning   Multiple Pain Sites No            Treatment: Supine gentle joint mobilization to left shoulder followed by pt performing A/ROM scapular retraction/ depression. Closed chain shoulder flexion and chest press with bilateral UE's in supine with therapist facilitating shoulder/ scapular postioning. Seated at table top, Hotpack applied to left shoulder x 10 mins ( no adverse  reactions)while pt performed A/ROM, AA/ROM forearm supination/ pronation then flipping playing cards, min v.c/ facilitation. Therapist reviewed yellow putty HEP with pt for improved grip and pinch, min v.c Standing to performm low range open chain reaching to place remove pegs from pegboard on tabletop, min v.c for positioning. Pt reported his arm felt better and looser at end of session.                    OT Short Term Goals - 02/07/17 6063      OT SHORT TERM GOAL #1   Title Pt and family will be mod I with HEP - 01/25/2017   Status Achieved     OT SHORT TERM GOAL #2   Title PT will report no greater pain than 4/10 LUE with shoulder flexion to 65* of unilateral functional reach (baseline = 55 with 6/10 pain)   Status Achieved     OT SHORT TERM GOAL #3   Title Pt will demonstrate ability to pick up cylinderical object with low reach with LUE with min compensations.    Status Achieved     OT SHORT TERM GOAL #4   Title Pt will demonstrate ability to release cyndrical object with low reach with no more than min facilitation.    Status Achieved     OT SHORT TERM  GOAL #5   Title Pt will demonstrate understanding of at least 2 tone reduction techniques in order to prep hand for functional use.    Status Deferred     OT SHORT TERM GOAL #6   Title Pt will be mod I with shower transfers   Status Achieved     OT SHORT TERM GOAL #7   Title Pt will be mod I using both hands to cut food during eating.   Status Achieved  using red foam           OT Long Term Goals - 02/07/17 0859      OT LONG TERM GOAL #1   Title Pt and family will be mod I with upgraded HEP - 02/22/2017   Status On-going     OT LONG TERM GOAL #2   Title Pt will demonstrate ability for mid reach to lift light object off shelf with LUE with min compensations and pain no greater than 2/10   Status On-going     OT LONG TERM GOAL #3   Title Pt will demonstrate ability to pick up small object with  either 2 pt or 3 pt pinch   Status On-going     OT LONG TERM GOAL #4   Title Pt will demonstrate ability to walk on uneven outdoor surfaces while carrying object in LUE in prep for returning to yard work.    Status On-going     OT LONG TERM GOAL #5   Title Pt will demonstrate ability for bilateral low reach to pick up 4 pound object during functional task.    Status On-going  based on new info from pt that pt had significant shoulder pain prior to stroke and likely rotator cuff tear               Plan - 02/10/17 0830    Clinical Impression Statement Pt is progressing towards goals. He continues to demonstrate shoulder pain with malpositioning. Pt demonstrates improved LUE functional use.   Rehab Potential Good   OT Frequency 2x / week, pt plans to reduce to 1x next week   OT Duration 8 weeks   OT Treatment/Interventions Self-care/ADL training;Moist Heat;Electrical Stimulation;Cryotherapy;DME and/or AE instruction;Neuromuscular education;Therapeutic exercise;Functional Mobility Training;Manual Therapy;Passive range of motion;Therapeutic activities;Patient/family education;Balance training   Plan NMR LUE   Consulted and Agree with Plan of Care Patient      Patient will benefit from skilled therapeutic intervention in order to improve the following deficits and impairments:  Abnormal gait, Decreased balance, Decreased coordination, Decreased knowledge of use of DME, Decreased mobility, Decreased range of motion, Difficulty walking, Decreased strength, Impaired sensation, Impaired tone, Impaired UE functional use, Pain  Visit Diagnosis: Hemiplegia and hemiparesis following cerebral infarction affecting left non-dominant side (HCC)  Acute pain of left shoulder  Muscle weakness (generalized)    Problem List Patient Active Problem List   Diagnosis Date Noted  . Subacromial impingement of left shoulder 01/21/2017  . Right basal ganglia embolic stroke (HCC) 12/07/2016  . Basal  ganglia infarction (HCC) 12/04/2016  . Tobacco use disorder   . Coronary artery disease   . GERD (gastroesophageal reflux disease)   . Hyperlipidemia   . Pleural effusion     Franchelle Foskett 02/10/2017, 10:45 AM Keene Breath, OTR/L [phone] Phone: [phone] 10:52 AM 02/10/17 [facility] [address] [place], [place], [address] Phone: [phone]   Fax:  [phone]  Name: Brandon Martin MRN: [id number] Date of Birth: 1966/06/16

## 2017-02-15 ENCOUNTER — Ambulatory Visit: Payer: Medicare Other | Admitting: Occupational Therapy

## 2017-02-15 ENCOUNTER — Ambulatory Visit: Payer: Medicare Other | Admitting: Physical Therapy

## 2017-02-17 ENCOUNTER — Encounter: Payer: Self-pay | Admitting: Occupational Therapy

## 2017-02-17 ENCOUNTER — Ambulatory Visit: Payer: Medicare Other | Admitting: Occupational Therapy

## 2017-02-17 ENCOUNTER — Ambulatory Visit: Payer: Medicare Other | Admitting: Physical Therapy

## 2017-02-17 DIAGNOSIS — I69354 Hemiplegia and hemiparesis following cerebral infarction affecting left non-dominant side: Secondary | ICD-10-CM

## 2017-02-17 DIAGNOSIS — M6281 Muscle weakness (generalized): Secondary | ICD-10-CM

## 2017-02-17 DIAGNOSIS — M25512 Pain in left shoulder: Secondary | ICD-10-CM

## 2017-02-17 DIAGNOSIS — R208 Other disturbances of skin sensation: Secondary | ICD-10-CM

## 2017-02-17 DIAGNOSIS — R2681 Unsteadiness on feet: Secondary | ICD-10-CM

## 2017-02-17 NOTE — Therapy (Signed)
[facility] [address] [place], [place], [address] Phone: [phone]   Fax:  [phone]  Occupational Therapy Treatment  Patient Details  Name: Brandon Martin MRN: [id number] Date of Birth: November 05, 1965 Referring Provider: Dr. Rodman Pickle  Encounter Date: 02/17/2017      OT End of Session - 02/17/17 1128    Visit Number 12   Number of Visits 16   Date for OT Re-Evaluation 02/22/17   Authorization Type [employer] no visit limitation no auth - G code required every 10 visits   Authorization - Visit Number 12   Authorization - Number of Visits 20   OT Start Time 0848   OT Stop Time 0930   OT Time Calculation (min) 42 min   Activity Tolerance Patient tolerated treatment well      Past Medical History:  Diagnosis Date  . Coronary artery disease   . GERD (gastroesophageal reflux disease)   . Hyperlipidemia   . Hypertension   . Pleural effusion   . Tobacco use disorder     Past Surgical History:  Procedure Laterality Date  . CORONARY ARTERY BYPASS GRAFT  x 3    There were no vitals filed for this visit.      Subjective Assessment - 02/17/17 0852    Subjective  I was able to steer my riding mower with my left hand this week.   Pertinent History see epic; Pt with R MCA CVA on 12/04/2016     Patient Stated Goals walk better and use  my arm   Currently in Pain? Yes   Pain Score 4    Pain Location Shoulder   Pain Orientation Left   Pain Descriptors / Indicators Aching   Pain Onset More than a month ago   Pain Frequency Intermittent   Aggravating Factors  certain movements and overuse "I mowed for 4 days and it made my shoulder sore"   Pain Relieving Factors therapy, repositioning.                       OT Treatments/Exercises (OP) - 02/17/17 0001      Neurological Re-education Exercises   Other Exercises 1 Neuro re ed following manual therapy to activate into greater painfree range of motion  beginning in supine with closed chain and progressing to unilateral overhead reach in supine. Transitioned in to sitting and addressed overhead bilateral reach in closed chain. Pt initially required mod facilitation however by end of session able to achieve approixmately 105* of shoulder flexion in closed chain and approximately 95* of unilateral reach with fine motor task with no more than 2/10 pain. Pt had shoulder pain before stroke and reports pain was 2-3/10 before stroke.  Pt improving in hand function as well demonstating imrproved ability for 2 and 3 point pinch during functional task.      Manual Therapy   Manual Therapy Joint mobilization;Scapular mobilization   Manual therapy comments joint and scap mob to decrease pain, improve alignment and decrease tightness - pt stated he overused his left arm by mowing for 4 days.  Discussed the need to try and avoid activities that inflame shoulder however pt has no real support for tasks at home 'I just gotta do it."                    OT Short Term Goals - 02/17/17 1126      OT SHORT TERM GOAL #1   Title Pt  and family will be mod I with HEP - 01/25/2017   Status Achieved     OT SHORT TERM GOAL #2   Title PT will report no greater pain than 4/10 LUE with shoulder flexion to 65* of unilateral functional reach (baseline = 55 with 6/10 pain)   Status Achieved     OT SHORT TERM GOAL #3   Title Pt will demonstrate ability to pick up cylinderical object with low reach with LUE with min compensations.    Status Achieved     OT SHORT TERM GOAL #4   Title Pt will demonstrate ability to release cyndrical object with low reach with no more than min facilitation.    Status Achieved     OT SHORT TERM GOAL #5   Title Pt will demonstrate understanding of at least 2 tone reduction techniques in order to prep hand for functional use.    Status Deferred     OT SHORT TERM GOAL #6   Title Pt will be mod I with shower transfers   Status Achieved      OT SHORT TERM GOAL #7   Title Pt will be mod I using both hands to cut food during eating.   Status Achieved  using red foam           OT Long Term Goals - 02/17/17 1126      OT LONG TERM GOAL #1   Title Pt and family will be mod I with upgraded HEP - 02/22/2017   Status Achieved     OT LONG TERM GOAL #2   Title Pt will demonstrate ability for mid reach to lift light object off shelf with LUE with min compensations and pain no greater than 2/10   Status On-going     OT LONG TERM GOAL #3   Title Pt will demonstrate ability to pick up small object with either 2 pt or 3 pt pinch   Status Achieved     OT LONG TERM GOAL #4   Title Pt will demonstrate ability to walk on uneven outdoor surfaces while carrying object in LUE in prep for returning to yard work.    Status On-going     OT LONG TERM GOAL #5   Title Pt will demonstrate ability for bilateral low reach to pick up 4 pound object during functional task.    Status Achieved  based on new info from pt that pt had significant shoulder pain prior to stroke and likely rotator cuff tear               Plan - 02/17/17 1127    Clinical Impression Statement Pt continues to progress overall however pt often overuses LUE resulting in increased pain.    Rehab Potential Good   OT Frequency 2x / week   OT Duration 8 weeks   OT Treatment/Interventions Self-care/ADL training;Moist Heat;Electrical Stimulation;Cryotherapy;DME and/or AE instruction;Neuromuscular education;Therapeutic exercise;Functional Mobility Training;Manual Therapy;Passive range of motion;Therapeutic activities;Patient/family education;Balance training   Plan NMR for LUE and trunk, postural alignment for reach in sitting and standing, functional hand use, functional use of LUE   Consulted and Agree with Plan of Care Patient      Patient will benefit from skilled therapeutic intervention in order to improve the following deficits and impairments:  Abnormal  gait, Decreased balance, Decreased coordination, Decreased knowledge of use of DME, Decreased mobility, Decreased range of motion, Difficulty walking, Decreased strength, Impaired sensation, Impaired tone, Impaired UE functional use, Pain  Visit Diagnosis: Hemiplegia and  hemiparesis following cerebral infarction affecting left non-dominant side (HCC)  Acute pain of left shoulder  Muscle weakness (generalized)  Unsteadiness on feet  Other disturbances of skin sensation    Problem List Patient Active Problem List   Diagnosis Date Noted  . Subacromial impingement of left shoulder 01/21/2017  . Right basal ganglia embolic stroke (HCC) 12/07/2016  . Basal ganglia infarction (HCC) 12/04/2016  . Tobacco use disorder   . Coronary artery disease   . GERD (gastroesophageal reflux disease)   . Hyperlipidemia   . Pleural effusion     Norton Pastel, OTR/L 02/17/2017, 11:30 AM  [facility] [address] [place], [place], [address] Phone: [phone]   Fax:  [phone]  Name: Brandon Martin MRN: [id number] Date of Birth: [birth year]

## 2017-02-18 ENCOUNTER — Encounter: Payer: Medicare Other | Attending: Physical Medicine & Rehabilitation

## 2017-02-18 ENCOUNTER — Ambulatory Visit: Payer: Medicare Other | Admitting: Physical Medicine & Rehabilitation

## 2017-02-18 DIAGNOSIS — I639 Cerebral infarction, unspecified: Secondary | ICD-10-CM | POA: Insufficient documentation

## 2017-02-18 DIAGNOSIS — M7542 Impingement syndrome of left shoulder: Secondary | ICD-10-CM | POA: Insufficient documentation

## 2017-02-18 DIAGNOSIS — I10 Essential (primary) hypertension: Secondary | ICD-10-CM | POA: Insufficient documentation

## 2017-02-18 DIAGNOSIS — Z951 Presence of aortocoronary bypass graft: Secondary | ICD-10-CM | POA: Insufficient documentation

## 2017-02-18 DIAGNOSIS — Z809 Family history of malignant neoplasm, unspecified: Secondary | ICD-10-CM | POA: Insufficient documentation

## 2017-02-18 DIAGNOSIS — F17200 Nicotine dependence, unspecified, uncomplicated: Secondary | ICD-10-CM | POA: Insufficient documentation

## 2017-02-18 DIAGNOSIS — I2510 Atherosclerotic heart disease of native coronary artery without angina pectoris: Secondary | ICD-10-CM | POA: Insufficient documentation

## 2017-02-18 DIAGNOSIS — E785 Hyperlipidemia, unspecified: Secondary | ICD-10-CM | POA: Insufficient documentation

## 2017-02-18 DIAGNOSIS — K219 Gastro-esophageal reflux disease without esophagitis: Secondary | ICD-10-CM | POA: Insufficient documentation

## 2017-02-18 DIAGNOSIS — Z8261 Family history of arthritis: Secondary | ICD-10-CM | POA: Insufficient documentation

## 2017-02-18 DIAGNOSIS — Z8249 Family history of ischemic heart disease and other diseases of the circulatory system: Secondary | ICD-10-CM | POA: Insufficient documentation

## 2017-02-18 DIAGNOSIS — J439 Emphysema, unspecified: Secondary | ICD-10-CM | POA: Insufficient documentation

## 2017-02-21 ENCOUNTER — Ambulatory Visit: Payer: Medicare Other | Admitting: Physical Therapy

## 2017-02-21 ENCOUNTER — Encounter: Payer: Medicare Other | Admitting: Occupational Therapy

## 2017-02-24 ENCOUNTER — Ambulatory Visit: Payer: Medicare Other | Admitting: Occupational Therapy

## 2017-02-24 ENCOUNTER — Ambulatory Visit: Payer: Medicare Other | Admitting: Physical Therapy

## 2017-02-24 DIAGNOSIS — I69354 Hemiplegia and hemiparesis following cerebral infarction affecting left non-dominant side: Secondary | ICD-10-CM

## 2017-02-24 NOTE — Therapy (Signed)
[facility] [address] [place], [place], [address] Phone: [phone]   Fax:  [phone]  Occupational Therapy Treatment  Patient Details  Name: Brandon Martin MRN: [id number] Date of Birth: 01/18/1966 Referring Provider: Dr. Danae Chen  Encounter Date: 02/24/2017    Past Medical History:  Diagnosis Date  . Coronary artery disease   . GERD (gastroesophageal reflux disease)   . Hyperlipidemia   . Hypertension   . Pleural effusion   . Tobacco use disorder     Past Surgical History:  Procedure Laterality Date  . CORONARY ARTERY BYPASS GRAFT  x 3    There were no vitals filed for this visit.   Pt arrived today and stated "I woke up this weekend and it felt like I had a 100 pound weight on my whole left side. Now I have pain on whole left side and my vision is worse (increased blurriness). Pt also states that his walking feels different and that "it feels like the leg doesn't want to move and like I have that 100 pound weight on my leg when I walk."  BP 140/80.  Given pt's history of recent R CVA, session terminated and advised pt to seek medical care at ED. Pt in agreement.                             OT Short Term Goals - 02/24/17 0815      OT SHORT TERM GOAL #1   Title Pt and family will be mod I with HEP - 01/25/2017   Status Achieved     OT SHORT TERM GOAL #2   Title PT will report no greater pain than 4/10 LUE with shoulder flexion to 65* of unilateral functional reach (baseline = 55 with 6/10 pain)   Status Achieved     OT SHORT TERM GOAL #3   Title Pt will demonstrate ability to pick up cylinderical object with low reach with LUE with min compensations.    Status Achieved     OT SHORT TERM GOAL #4   Title Pt will demonstrate ability to release cyndrical object with low reach with no more than min facilitation.    Status Achieved     OT SHORT TERM GOAL #5   Title Pt will  demonstrate understanding of at least 2 tone reduction techniques in order to prep hand for functional use.    Status Deferred     OT SHORT TERM GOAL #6   Title Pt will be mod I with shower transfers   Status Achieved     OT SHORT TERM GOAL #7   Title Pt will be mod I using both hands to cut food during eating.   Status Achieved  using red foam           OT Long Term Goals - 02/24/17 0815      OT LONG TERM GOAL #1   Title Pt and family will be mod I with upgraded HEP - 02/22/2017   Status Achieved     OT LONG TERM GOAL #2   Title Pt will demonstrate ability for mid reach to lift light object off shelf with LUE with min compensations and pain no greater than 2/10   Status On-going     OT LONG TERM GOAL #3   Title Pt will demonstrate ability to pick up small object with either 2 pt or 3 pt pinch  Status Achieved     OT LONG TERM GOAL #4   Title Pt will demonstrate ability to walk on uneven outdoor surfaces while carrying object in LUE in prep for returning to yard work.    Status On-going     OT LONG TERM GOAL #5   Title Pt will demonstrate ability for bilateral low reach to pick up 4 pound object during functional task.    Status Achieved  based on new info from pt that pt had significant shoulder pain prior to stroke and likely rotator cuff tear             Patient will benefit from skilled therapeutic intervention in order to improve the following deficits and impairments:     Visit Diagnosis: Hemiplegia and hemiparesis following cerebral infarction affecting left non-dominant side [facility])    Problem List Patient Active Problem List   Diagnosis Date Noted  . Subacromial impingement of left shoulder 01/21/2017  . Right basal ganglia embolic stroke ([place]) 17/47/1595  . Basal ganglia infarction ([place]) 12/04/2016  . Tobacco use disorder   . Coronary artery disease   . GERD (gastroesophageal reflux disease)   . Hyperlipidemia   . Pleural effusion      Quay Burow, OTR/L 02/24/2017, 8:16 AM  [facility] [address] [place], [place], [address] Phone: [phone]   Fax:  [phone]  Name: VERDUN RACKLEY MRN: [id number] Date of Birth: 08-07-1965

## 2017-02-28 ENCOUNTER — Ambulatory Visit: Payer: Medicare Other | Admitting: Physical Therapy

## 2017-02-28 ENCOUNTER — Ambulatory Visit: Payer: Medicare Other | Admitting: Interventional Cardiology

## 2017-02-28 ENCOUNTER — Ambulatory Visit: Payer: Medicare Other | Admitting: Occupational Therapy

## 2017-03-03 ENCOUNTER — Ambulatory Visit: Payer: Medicare Other | Admitting: Physical Therapy

## 2017-03-03 ENCOUNTER — Ambulatory Visit: Payer: Medicare Other | Admitting: Occupational Therapy

## 2017-03-23 ENCOUNTER — Ambulatory Visit: Payer: Medicare Other | Admitting: Neurology

## 2017-04-19 ENCOUNTER — Ambulatory Visit: Payer: Medicare Other | Admitting: Neurology

## 2017-04-25 ENCOUNTER — Encounter: Payer: Self-pay | Admitting: Occupational Therapy

## 2017-05-26 ENCOUNTER — Encounter: Payer: Self-pay | Admitting: Occupational Therapy

## 2017-05-26 NOTE — Therapy (Unsigned)
[facility] [address] [place], [place], [address] Phone: [phone]   Fax:  [phone]  Occupational Therapy Treatment  Patient Details  Name: Brandon Martin MRN: [id number] Date of Birth: 1965-10-30 Referring Provider: Dr. Rodman Pickle   Encounter Date: 05/26/2017    Past Medical History:  Diagnosis Date  . Coronary artery disease   . GERD (gastroesophageal reflux disease)   . Hyperlipidemia   . Hypertension   . Pleural effusion   . Tobacco use disorder     Past Surgical History:  Procedure Laterality Date  . CORONARY ARTERY BYPASS GRAFT  x 3    There were no vitals filed for this visit.                          OT Short Term Goals - 05/26/17 1058      OT SHORT TERM GOAL #1   Title  Pt and family will be mod I with HEP - 01/25/2017    Status  Achieved      OT SHORT TERM GOAL #2   Title  PT will report no greater pain than 4/10 LUE with shoulder flexion to 65* of unilateral functional reach (baseline = 55 with 6/10 pain)    Status  Achieved      OT SHORT TERM GOAL #3   Title  Pt will demonstrate ability to pick up cylinderical object with low reach with LUE with min compensations.     Status  Achieved      OT SHORT TERM GOAL #4   Title  Pt will demonstrate ability to release cyndrical object with low reach with no more than min facilitation.     Status  Achieved      OT SHORT TERM GOAL #5   Title  Pt will demonstrate understanding of at least 2 tone reduction techniques in order to prep hand for functional use.     Status  Deferred      OT SHORT TERM GOAL #6   Title  Pt will be mod I with shower transfers    Status  Achieved      OT SHORT TERM GOAL #7   Title  Pt will be mod I using both hands to cut food during eating.    Status  Achieved using red foam        OT Long Term Goals - 05/26/17 1058      OT LONG TERM GOAL #1   Title  Pt and family will be mod I with  upgraded HEP - 02/22/2017    Status  Achieved      OT LONG TERM GOAL #2   Title  Pt will demonstrate ability for mid reach to lift light object off shelf with LUE with min compensations and pain no greater than 2/10    Status  Not Met      OT LONG TERM GOAL #3   Title  Pt will demonstrate ability to pick up small object with either 2 pt or 3 pt pinch    Status  Achieved      OT LONG TERM GOAL #4   Title  Pt will demonstrate ability to walk on uneven outdoor surfaces while carrying object in LUE in prep for returning to yard work.     Status  Not Met      OT LONG TERM GOAL #5   Title  Pt will demonstrate ability for bilateral low  reach to pick up 4 pound object during functional task.     Status  Achieved based on new info from pt that pt had significant shoulder pain prior to stroke and likely rotator cuff tear            Plan - 05/26/17 1058    Clinical Impression Statement  Pt did not return to therapy after last visit therefore will discharge from OT    Plan  d/c from OT       Patient will benefit from skilled therapeutic intervention in order to improve the following deficits and impairments:     Visit Diagnosis: No diagnosis found.    Problem List Patient Active Problem List   Diagnosis Date Noted  . Subacromial impingement of left shoulder 01/21/2017  . Right basal ganglia embolic stroke (HCC) 12/07/2016  . Basal ganglia infarction (HCC) 12/04/2016  . Tobacco use disorder   . Coronary artery disease   . GERD (gastroesophageal reflux disease)   . Hyperlipidemia   . Pleural effusion    OCCUPATIONAL THERAPY DISCHARGE SUMMARY  Visits from Start of Care: 12  Current functional level related to goals / functional outcomes: See above   Remaining deficits: See last note   Education / Equipment: HEP Plan: Patient agrees to discharge.  Patient goals were partially met. Patient is being discharged due to not returning since the last visit.  ?????      Norton Pastel, OTR/L 05/26/2017, 10:59 AM  [facility] [address] [place], [place], [address] Phone: [phone]   Fax:  [phone]  Name: Brandon Martin MRN: [id number] Date of Birth: 02-01-1966

## 2019-06-20 ENCOUNTER — Other Ambulatory Visit: Payer: Self-pay | Admitting: Gastroenterology

## 2019-06-20 DIAGNOSIS — R1010 Upper abdominal pain, unspecified: Secondary | ICD-10-CM

## 2019-06-20 DIAGNOSIS — R1310 Dysphagia, unspecified: Secondary | ICD-10-CM

## 2019-07-02 ENCOUNTER — Ambulatory Visit
Admission: RE | Admit: 2019-07-02 | Discharge: 2019-07-02 | Disposition: A | Discharge status: Home / Self Care | Payer: Medicare Other | Source: Ambulatory Visit | Attending: Gastroenterology | Admitting: Gastroenterology

## 2019-07-02 DIAGNOSIS — R1310 Dysphagia, unspecified: Secondary | ICD-10-CM

## 2019-07-02 DIAGNOSIS — R1010 Upper abdominal pain, unspecified: Secondary | ICD-10-CM

## 2020-11-04 DIAGNOSIS — I10 Essential (primary) hypertension: Secondary | ICD-10-CM | POA: Diagnosis not present

## 2020-11-04 DIAGNOSIS — Z0000 Encounter for general adult medical examination without abnormal findings: Secondary | ICD-10-CM | POA: Diagnosis not present

## 2020-11-04 DIAGNOSIS — M179 Osteoarthritis of knee, unspecified: Secondary | ICD-10-CM | POA: Diagnosis not present

## 2020-11-04 DIAGNOSIS — F17210 Nicotine dependence, cigarettes, uncomplicated: Secondary | ICD-10-CM | POA: Diagnosis not present

## 2020-11-04 DIAGNOSIS — E782 Mixed hyperlipidemia: Secondary | ICD-10-CM | POA: Diagnosis not present

## 2020-11-04 DIAGNOSIS — E1169 Type 2 diabetes mellitus with other specified complication: Secondary | ICD-10-CM | POA: Diagnosis not present

## 2020-11-04 DIAGNOSIS — Z7984 Long term (current) use of oral hypoglycemic drugs: Secondary | ICD-10-CM | POA: Diagnosis not present

## 2020-11-04 DIAGNOSIS — K219 Gastro-esophageal reflux disease without esophagitis: Secondary | ICD-10-CM | POA: Diagnosis not present

## 2020-11-04 DIAGNOSIS — Z86010 Personal history of colonic polyps: Secondary | ICD-10-CM | POA: Diagnosis not present

## 2020-11-07 ENCOUNTER — Other Ambulatory Visit: Payer: Self-pay | Admitting: Family Medicine

## 2020-11-07 DIAGNOSIS — F17200 Nicotine dependence, unspecified, uncomplicated: Secondary | ICD-10-CM

## 2020-11-07 DIAGNOSIS — F17210 Nicotine dependence, cigarettes, uncomplicated: Secondary | ICD-10-CM

## 2020-11-07 DIAGNOSIS — F17209 Nicotine dependence, unspecified, with unspecified nicotine-induced disorders: Secondary | ICD-10-CM

## 2020-11-11 ENCOUNTER — Ambulatory Visit: Payer: Medicare Other | Admitting: Podiatry

## 2020-11-11 ENCOUNTER — Other Ambulatory Visit: Payer: Self-pay | Admitting: Podiatry

## 2020-11-11 ENCOUNTER — Other Ambulatory Visit: Payer: Self-pay

## 2020-11-11 ENCOUNTER — Ambulatory Visit (INDEPENDENT_AMBULATORY_CARE_PROVIDER_SITE_OTHER): Payer: Medicare Other

## 2020-11-11 DIAGNOSIS — E1142 Type 2 diabetes mellitus with diabetic polyneuropathy: Secondary | ICD-10-CM | POA: Diagnosis not present

## 2020-11-11 DIAGNOSIS — M722 Plantar fascial fibromatosis: Secondary | ICD-10-CM

## 2020-11-11 DIAGNOSIS — M778 Other enthesopathies, not elsewhere classified: Secondary | ICD-10-CM

## 2020-11-11 DIAGNOSIS — B351 Tinea unguium: Secondary | ICD-10-CM | POA: Diagnosis not present

## 2020-11-11 DIAGNOSIS — M79676 Pain in unspecified toe(s): Secondary | ICD-10-CM

## 2020-11-11 MED ORDER — GABAPENTIN 300 MG PO CAPS: 300.0000 mg | ORAL_CAPSULE | Freq: Two times a day (BID) | ORAL | 1 refills | Status: DC

## 2020-11-11 MED ORDER — TRIAMCINOLONE ACETONIDE 40 MG/ML IJ SUSP
40.0000 mg | Freq: Once | INTRAMUSCULAR | Status: AC
  Administered 2020-11-11: 40 mg

## 2020-11-11 NOTE — Progress Notes (Signed)
Subjective:  Patient ID: Brandon Martin, male    DOB: [birth year],  MRN: [id number] HPI Chief Complaint  Patient presents with  . Foot Pain    Bilateral foot pain x 3 years. Pt describes pain as someone holding his foot and sticking needles in his arch and heel. Pain associated with tingling and numbness. No edema.     55 y.o. male presents with the above complaint.   ROS: Denies fever chills nausea vomiting muscle aches pains calf pain back pain chest pain shortness of breath.  Past Medical History:  Diagnosis Date  . Coronary artery disease   . GERD (gastroesophageal reflux disease)   . Hyperlipidemia   . Hypertension   . Pleural effusion   . Tobacco use disorder    Past Surgical History:  Procedure Laterality Date  . CORONARY ARTERY BYPASS GRAFT  x 3    Current Outpatient Medications:  .  cyclobenzaprine (FLEXERIL) 5 MG tablet, 1 tablet at bedtime, up to 3 times a day, Disp: , Rfl:  .  gabapentin (NEURONTIN) 300 MG capsule, Take 1 capsule (300 mg total) by mouth 2 (two) times daily., Disp: 60 capsule, Rfl: 1 .  nitroGLYCERIN (NITRODUR - DOSED IN MG/24 HR) 0.4 mg/hr patch, 1 tablet as directed, Disp: , Rfl:  .  triamcinolone (KENALOG) 0.025 % cream, 1 application, Disp: , Rfl:  .  amLODipine (NORVASC) 10 MG tablet, Take 1 tablet (10 mg total) by mouth daily., Disp: 30 tablet, Rfl: 1 .  aspirin (ASPIRIN 81) 81 MG chewable tablet, 1 tablet, Disp: , Rfl:  .  atorvastatin (LIPITOR) 20 MG tablet, Take 1 tablet (20 mg total) by mouth daily at 6 PM., Disp: 30 tablet, Rfl: 1 .  clopidogrel (PLAVIX) 75 MG tablet, Take 1 tablet (75 mg total) by mouth daily., Disp: 30 tablet, Rfl: 1 .  metFORMIN (GLUCOPHAGE) 500 MG tablet, TAKE 2 TABLETS IN THE MORNING WITH A MEAL, 1 TABLET IN THE EVENING WITH A MEAL, Disp: , Rfl:  .  nitroGLYCERIN (NITROSTAT) 0.4 MG SL tablet, PLACE 1 TABLET UNDER TONGUE AS DIRECTED AS NEEDED FOR CHEST PAIN, Disp: , Rfl: 6 .  omeprazole (PRILOSEC) 20 MG capsule, Take 1  capsule (20 mg total) by mouth daily., Disp: 30 capsule, Rfl: 1 .  pantoprazole (PROTONIX) 40 MG tablet, Take 1 tablet by mouth 2 (two) times daily., Disp: , Rfl:  .  rosuvastatin (CRESTOR) 20 MG tablet, Take 1 tablet by mouth daily., Disp: , Rfl:   Allergies  Allergen Reactions  . Aspirin Other (See Comments)    HX of gastric ulcers  . Morphine And Related Nausea And Vomiting  . Latex Rash   Review of Systems Objective:  There were no vitals filed for this visit.  General: Well developed, nourished, in no acute distress, alert and oriented x3   Dermatological: Skin is warm, dry and supple bilateral. Nails x 10 are thick yellow dystrophic clinically mycotic painful palpation as well as debridement.; remaining integument appears unremarkable at this time. There are no open sores, no preulcerative lesions, no rash or signs of infection present.  Vascular: Dorsalis Pedis artery and Posterior Tibial artery pedal pulses are 2/4 bilateral with immedate capillary fill time. Pedal hair growth present. No varicosities and no lower extremity edema present bilateral.   Neruologic: Grossly intact via light touch bilateral. Vibratory intact via tuning fork bilateral. Protective threshold with Semmes Wienstein monofilament intact to all pedal sites bilateral. Patellar and Achilles deep tendon reflexes 2+ bilateral. No Babinski  or clonus noted bilateral.   Musculoskeletal: No gross boney pedal deformities bilateral. No pain, crepitus, or limitation noted with foot and ankle range of motion bilateral. Muscular strength 5/5 in all groups tested bilateral.  Pain on palpation medial calcaneal tubercles bilateral.  Gait: Unassisted, Nonantalgic.    Radiographs:  Radiographs taken today demonstrate cavus foot deformities with soft tissue increase in density plantar calcaneal insertion sites bilaterally.  No acute findings are identified.  Assessment & Plan:   Assessment: Diabetic peripheral neuropathy.   Plan fasciitis bilateral.  Painful elongated toenails.  Plan: Discussed etiology pathology and surgical therapies injected bilateral heels today 20 mg Kenalog 5 mg Marcaine point maximal tenderness bilateral.  Tolerated procedure well without complications.  Start him on gabapentin 300 mg 1 p.o.twice daily for his neuropathy.  I debrided nails 1 through 5 bilaterally.  Follow-up with me in 1 month for med check.     [place]. [place], [place]

## 2020-11-13 ENCOUNTER — Ambulatory Visit: Payer: Self-pay | Admitting: Cardiology

## 2020-11-26 ENCOUNTER — Telehealth: Payer: Self-pay | Admitting: Podiatry

## 2020-11-26 ENCOUNTER — Telehealth: Payer: Self-pay | Admitting: *Deleted

## 2020-11-26 NOTE — Telephone Encounter (Signed)
Can you make this change for me.

## 2020-11-26 NOTE — Telephone Encounter (Signed)
Brandon Martin w/ [facility] is wanting to inform office that patient's Gabapentin refill prescriptions will now be transferred to them from [facility].

## 2020-11-26 NOTE — Telephone Encounter (Signed)
Caren Griffins with [facility] called and stated that this patients Gabapentin will need to be sent to [facility] going forward.

## 2020-12-01 ENCOUNTER — Ambulatory Visit: Payer: Medicare Other

## 2020-12-01 MED ORDER — GABAPENTIN 300 MG PO CAPS: 300.0000 mg | ORAL_CAPSULE | Freq: Two times a day (BID) | ORAL | 1 refills | Status: DC

## 2020-12-01 NOTE — Addendum Note (Signed)
Addended by: Rip Harbour on: 12/01/2020 05:16 PM   Modules accepted: Orders

## 2020-12-01 NOTE — Telephone Encounter (Signed)
Sent new Rx to [facility]

## 2020-12-03 ENCOUNTER — Ambulatory Visit: Payer: Self-pay | Admitting: Cardiology

## 2020-12-16 ENCOUNTER — Ambulatory Visit (INDEPENDENT_AMBULATORY_CARE_PROVIDER_SITE_OTHER): Payer: Medicare Other | Admitting: Podiatry

## 2020-12-16 ENCOUNTER — Encounter: Payer: Self-pay | Admitting: Podiatry

## 2020-12-16 ENCOUNTER — Other Ambulatory Visit: Payer: Self-pay

## 2020-12-16 DIAGNOSIS — M722 Plantar fascial fibromatosis: Secondary | ICD-10-CM | POA: Diagnosis not present

## 2020-12-16 DIAGNOSIS — E1142 Type 2 diabetes mellitus with diabetic polyneuropathy: Secondary | ICD-10-CM | POA: Diagnosis not present

## 2020-12-16 MED ORDER — GABAPENTIN 300 MG PO CAPS: 600.0000 mg | ORAL_CAPSULE | Freq: Two times a day (BID) | ORAL | 3 refills | Status: DC

## 2020-12-17 ENCOUNTER — Inpatient Hospital Stay: Admission: RE | Admit: 2020-12-17 | Payer: Medicare Other | Source: Ambulatory Visit

## 2020-12-17 NOTE — Progress Notes (Signed)
Presents today for follow-up of neuropathy.  States that he has been taking 300 mg and they are still burning at night denies any problems taking the medication denies any dizziness suicidal ideations or even sleeping this.  Objective: Vital signs are stable alert oriented x3.  Pulses are palpable.  There is no change in physical exam no additional edema bilaterally.  Assessment: Significant diabetic peripheral neuropathy.  Plan: We are going to increase his gabapentin to 600 mg at night and 600 mg in the morning.  I will follow-up with him in 1 month.

## 2020-12-25 ENCOUNTER — Telehealth: Payer: Self-pay | Admitting: *Deleted

## 2020-12-25 NOTE — Telephone Encounter (Signed)
Jon Gills at [facility] is calling and requesting office notes gabapentin dosage change from patient's last visit.   Faxed office notes with successful confirmation 12/25/20.

## 2020-12-26 DIAGNOSIS — E782 Mixed hyperlipidemia: Secondary | ICD-10-CM | POA: Diagnosis not present

## 2020-12-26 DIAGNOSIS — K219 Gastro-esophageal reflux disease without esophagitis: Secondary | ICD-10-CM | POA: Diagnosis not present

## 2020-12-26 DIAGNOSIS — I2510 Atherosclerotic heart disease of native coronary artery without angina pectoris: Secondary | ICD-10-CM | POA: Diagnosis not present

## 2020-12-26 DIAGNOSIS — I63511 Cerebral infarction due to unspecified occlusion or stenosis of right middle cerebral artery: Secondary | ICD-10-CM | POA: Diagnosis not present

## 2020-12-26 DIAGNOSIS — E119 Type 2 diabetes mellitus without complications: Secondary | ICD-10-CM | POA: Diagnosis not present

## 2020-12-26 DIAGNOSIS — M179 Osteoarthritis of knee, unspecified: Secondary | ICD-10-CM | POA: Diagnosis not present

## 2020-12-26 DIAGNOSIS — I10 Essential (primary) hypertension: Secondary | ICD-10-CM | POA: Diagnosis not present

## 2020-12-26 DIAGNOSIS — G8929 Other chronic pain: Secondary | ICD-10-CM | POA: Diagnosis not present

## 2020-12-26 DIAGNOSIS — E1169 Type 2 diabetes mellitus with other specified complication: Secondary | ICD-10-CM | POA: Diagnosis not present

## 2020-12-26 DIAGNOSIS — I208 Other forms of angina pectoris: Secondary | ICD-10-CM | POA: Diagnosis not present

## 2020-12-26 DIAGNOSIS — E7800 Pure hypercholesterolemia, unspecified: Secondary | ICD-10-CM | POA: Diagnosis not present

## 2020-12-31 ENCOUNTER — Ambulatory Visit
Admission: RE | Admit: 2020-12-31 | Discharge: 2020-12-31 | Disposition: A | Discharge status: Home / Self Care | Payer: Medicare Other | Source: Ambulatory Visit | Attending: Family Medicine | Admitting: Family Medicine

## 2020-12-31 DIAGNOSIS — F17200 Nicotine dependence, unspecified, uncomplicated: Secondary | ICD-10-CM

## 2020-12-31 DIAGNOSIS — F17210 Nicotine dependence, cigarettes, uncomplicated: Secondary | ICD-10-CM

## 2020-12-31 DIAGNOSIS — F17209 Nicotine dependence, unspecified, with unspecified nicotine-induced disorders: Secondary | ICD-10-CM

## 2021-01-01 ENCOUNTER — Ambulatory Visit: Payer: Self-pay | Admitting: Cardiology

## 2021-01-13 ENCOUNTER — Ambulatory Visit: Payer: Medicare Other | Admitting: Podiatry

## 2021-01-23 ENCOUNTER — Ambulatory Visit: Payer: Self-pay | Admitting: Cardiology

## 2021-01-27 DIAGNOSIS — I208 Other forms of angina pectoris: Secondary | ICD-10-CM | POA: Diagnosis not present

## 2021-01-27 DIAGNOSIS — G8929 Other chronic pain: Secondary | ICD-10-CM | POA: Diagnosis not present

## 2021-01-27 DIAGNOSIS — E782 Mixed hyperlipidemia: Secondary | ICD-10-CM | POA: Diagnosis not present

## 2021-01-27 DIAGNOSIS — E7800 Pure hypercholesterolemia, unspecified: Secondary | ICD-10-CM | POA: Diagnosis not present

## 2021-01-27 DIAGNOSIS — E119 Type 2 diabetes mellitus without complications: Secondary | ICD-10-CM | POA: Diagnosis not present

## 2021-01-27 DIAGNOSIS — I10 Essential (primary) hypertension: Secondary | ICD-10-CM | POA: Diagnosis not present

## 2021-01-27 DIAGNOSIS — K219 Gastro-esophageal reflux disease without esophagitis: Secondary | ICD-10-CM | POA: Diagnosis not present

## 2021-01-27 DIAGNOSIS — I2510 Atherosclerotic heart disease of native coronary artery without angina pectoris: Secondary | ICD-10-CM | POA: Diagnosis not present

## 2021-01-27 DIAGNOSIS — E1169 Type 2 diabetes mellitus with other specified complication: Secondary | ICD-10-CM | POA: Diagnosis not present

## 2021-02-11 ENCOUNTER — Ambulatory Visit: Payer: Medicare Other | Admitting: Cardiology

## 2021-02-11 ENCOUNTER — Other Ambulatory Visit: Payer: Self-pay

## 2021-02-11 ENCOUNTER — Encounter: Payer: Self-pay | Admitting: Cardiology

## 2021-02-11 VITALS — BP 136/82 | HR 83 | Temp 98.3°F | Resp 16 | Ht 68.0 in | Wt 198.0 lb

## 2021-02-11 DIAGNOSIS — I2510 Atherosclerotic heart disease of native coronary artery without angina pectoris: Secondary | ICD-10-CM

## 2021-02-11 DIAGNOSIS — E782 Mixed hyperlipidemia: Secondary | ICD-10-CM | POA: Diagnosis not present

## 2021-02-11 DIAGNOSIS — I10 Essential (primary) hypertension: Secondary | ICD-10-CM | POA: Diagnosis not present

## 2021-02-11 DIAGNOSIS — F17210 Nicotine dependence, cigarettes, uncomplicated: Secondary | ICD-10-CM | POA: Diagnosis not present

## 2021-02-11 MED ORDER — REPATHA 140 MG/ML ~~LOC~~ SOSY: 140.0000 mg | PREFILLED_SYRINGE | SUBCUTANEOUS | 5 refills | Status: DC

## 2021-02-11 NOTE — Progress Notes (Signed)
Patient referred by Joycelyn Rua, MD for coronary artery disease  Subjective:   Brandon Martin, male    DOB: August 13, 1965, 55 y.o.   MRN: [id number]   Chief Complaint  Patient presents with   Coronary Artery Disease   Hyperlipidemia     HPI  55 y.o. Caucasian male with hypertension, hyperlipidemia, type 2 diabetes mellitus, coronary artery disease s/p CABGX3 (2012), nicotine dependence  Patient had an episode of unstable angina in 2012, associated with mild chest pain and severe diaphoresis that led to coronary angiography showing severe multivessel disease.  He underwent CABG x3 by Dr. Dorris Fetch in 2012.  He has not seen a cardiologist in several years since then.  Patient is here with his brother today.  Patient is on disability from back surgery since 1999.  He lives at home with his mother, takes care of her.  He works in the yard with mowing, proudly states that he keeps his yard clean.  On exertion, he does not have any specific chest pain or dyspnea symptoms.  1 week ago, he had an episode of chest pain that relieved with nitroglycerin, at rest.  Unfortunately, he continues to smoke half pack a day.  Prior to his CABG, he used to smoke 2 packs a day.  He is not ready to quit yet.  He is compliant with his medical therapy.  Lipids remain uncontrolled.     Past Medical History:  Diagnosis Date   Coronary artery disease    GERD (gastroesophageal reflux disease)    Hyperlipidemia    Hypertension    Pleural effusion    Tobacco use disorder      Past Surgical History:  Procedure Laterality Date   CORONARY ARTERY BYPASS GRAFT  x 3     Social History   Tobacco Use  Smoking Status Some Days   Types: Cigarettes   Last attempt to quit: 04/20/2011   Years since quitting: 9.8  Smokeless Tobacco Former    Social History   Substance and Sexual Activity  Alcohol Use No     Family History  Problem Relation Age of Onset   Hypertension Mother    Arthritis  Mother    Cancer Father    Transient ischemic attack Sister      Current Outpatient Medications on File Prior to Visit  Medication Sig Dispense Refill   amLODipine (NORVASC) 10 MG tablet Take 1 tablet (10 mg total) by mouth daily. 30 tablet 1   aspirin 81 MG chewable tablet 1 tablet     atorvastatin (LIPITOR) 20 MG tablet Take 1 tablet (20 mg total) by mouth daily at 6 PM. 30 tablet 1   clopidogrel (PLAVIX) 75 MG tablet Take 1 tablet (75 mg total) by mouth daily. 30 tablet 1   cyclobenzaprine (FLEXERIL) 5 MG tablet 1 tablet at bedtime, up to 3 times a day     gabapentin (NEURONTIN) 300 MG capsule Take 2 capsules (600 mg total) by mouth 2 (two) times daily. 180 capsule 3   metFORMIN (GLUCOPHAGE) 500 MG tablet TAKE 2 TABLETS IN THE MORNING WITH A MEAL, 1 TABLET IN THE EVENING WITH A MEAL     nitroGLYCERIN (NITROSTAT) 0.4 MG SL tablet PLACE 1 TABLET UNDER TONGUE AS DIRECTED AS NEEDED FOR CHEST PAIN  6   pantoprazole (PROTONIX) 40 MG tablet Take 1 tablet by mouth 2 (two) times daily.     rosuvastatin (CRESTOR) 20 MG tablet Take 1 tablet by mouth daily.  No current facility-administered medications on file prior to visit.    Cardiovascular and other pertinent studies:  EKG 02/11/2021: Sinus rhythm 71 bpm Right bundle branch block  Echocardiogram 2018: - LVEF 55-60%, moderate LVH, normal wall motion, grade 1 DD with    indeterminate LV filling pressure, normal LA size, normal IVC.   CABGX3 2012 (Dr. Dorris Fetch): LIMA-LAD, left radial artery-PDA, SVG-OM1   Recent labs: 11/04/2020: Glucose 128, BUN/Cr 21/1.12. EGFR 77. Na/K 138/4.4. Rest of the CMP normal H/H 15/47. MCV 88. Platelets 281 HbA1C 6.0% Chol 266, TG 259, HDL 38, LDL 178 TSH N/A    Review of Systems  Cardiovascular:  Positive for chest pain. Negative for dyspnea on exertion, leg swelling, palpitations and syncope.  Musculoskeletal:  Positive for back pain.  Neurological:  Positive for paresthesias.         Vitals:   02/11/21 0836  BP: 136/82  Pulse: 83  Resp: 16  Temp: 98.3 F (36.8 C)  SpO2: 97%     Body mass index is 30.11 kg/m. Filed Weights   02/11/21 0836  Weight: 198 lb (89.8 kg)     Objective:   Physical Exam Vitals and nursing note reviewed.  Constitutional:      General: He is not in acute distress. Neck:     Vascular: No JVD.  Cardiovascular:     Rate and Rhythm: Normal rate and regular rhythm.     Pulses: Normal pulses.     Heart sounds: Normal heart sounds. No murmur heard. Pulmonary:     Effort: Pulmonary effort is normal.     Breath sounds: Normal breath sounds. No wheezing or rales.  Musculoskeletal:     Right lower leg: No edema.     Left lower leg: No edema.       Assessment & Recommendations:    55 y.o. Caucasian male with hypertension, hyperlipidemia, type 2 diabetes mellitus, coronary artery disease s/p CABGX3 (2012), nicotine dependence  CAD: S/p CABGX3 (2012) Occasional angina episodes. Recommend exercise nuclear stress test and echocardiogram. No indication for Plavix, thus stopped.  Continue aspirin 81 mg daily. Lipids uncontrolled.  LDL 178. Continue Crestor 20 mg daily.  Added Repatha once every 2 weeks. Reasonable not to be on beta-blocker several years post CABG. Continue amlodipine.  Hypertension: Controlled  Mixed hyperlipidemia: As above  Nicotine dependence: Tobacco cessation counseling:  - Currently smoking 0.5 packs/day   - Patient was informed of the dangers of tobacco abuse including stroke, cancer, and MI, as well as benefits of tobacco cessation. - Patient is NOT willing to quit at this time. - Approximately 5 mins were spent counseling patient cessation techniques. We discussed various methods to help quit smoking, including deciding on a date to quit, joining a support group, pharmacological agents.  - I will reassess his progress at the next follow-up visit  Thank you for referring the patient to [place].  Please feel free to contact with any questions.   Elder Negus, MD Pager: [phone] Office: [phone]

## 2021-03-02 ENCOUNTER — Ambulatory Visit: Payer: Medicare Other

## 2021-03-02 ENCOUNTER — Other Ambulatory Visit: Payer: Self-pay

## 2021-03-02 DIAGNOSIS — I2510 Atherosclerotic heart disease of native coronary artery without angina pectoris: Secondary | ICD-10-CM

## 2021-03-02 LAB — PCV MYOCARDIAL PERFUSION WO LEXISCAN: MPHR: 164 {beats}/min

## 2021-03-12 NOTE — Progress Notes (Signed)
Patient referred by Joycelyn Rua, MD for coronary artery disease  Subjective:   Brandon Martin, male    DOB: 1966-03-03, 55 y.o.   MRN: [id number]   Chief Complaint  Patient presents with  . Coronary artery disease involving native coronary artery of  . Follow-up  . Results     HPI  55 y.o. Caucasian male with hypertension, hyperlipidemia, type 2 diabetes mellitus, coronary artery disease s/p CABGX3 (2012), nicotine dependence  Patient is doing well, denies chest pain, shortness of breath, palpitations, leg edema, orthopnea, PND, TIA/syncope. Reviewed recent stress test results with the patient, details below. Patient has not started Repatha yet. Unfortunately, he continues ot smoke 1/2 pack per day.    Initial consultation HPI 02/2021: Patient had an episode of unstable angina in 2012, associated with mild chest pain and severe diaphoresis that led to coronary angiography showing severe multivessel disease.  He underwent CABG x3 by Dr. Dorris Fetch in 2012.  He has not seen a cardiologist in several years since then.  Patient is here with his brother today.  Patient is on disability from back surgery since 1999.  He lives at home with his mother, takes care of her.  He works in the yard with mowing, proudly states that he keeps his yard clean.  On exertion, he does not have any specific chest pain or dyspnea symptoms.  1 week ago, he had an episode of chest pain that relieved with nitroglycerin, at rest.  Unfortunately, he continues to smoke half pack a day.  Prior to his CABG, he used to smoke 2 packs a day.  He is not ready to quit yet.  He is compliant with his medical therapy.  Lipids remain uncontrolled.    Current Outpatient Medications on File Prior to Visit  Medication Sig Dispense Refill  . amLODipine (NORVASC) 10 MG tablet Take 1 tablet (10 mg total) by mouth daily. 30 tablet 1  . aspirin 81 MG chewable tablet 1 tablet    . atorvastatin (LIPITOR) 20 MG tablet Take 1  tablet (20 mg total) by mouth daily at 6 PM. 30 tablet 1  . cyclobenzaprine (FLEXERIL) 5 MG tablet 1 tablet at bedtime, up to 3 times a day    . Evolocumab (REPATHA) 140 MG/ML SOSY Inject 140 mg into the skin every 14 (fourteen) days. 6 mL 5  . gabapentin (NEURONTIN) 300 MG capsule Take 2 capsules (600 mg total) by mouth 2 (two) times daily. 180 capsule 3  . metFORMIN (GLUCOPHAGE) 500 MG tablet TAKE 2 TABLETS IN THE MORNING WITH A MEAL, 1 TABLET IN THE EVENING WITH A MEAL    . nitroGLYCERIN (NITROSTAT) 0.4 MG SL tablet PLACE 1 TABLET UNDER TONGUE AS DIRECTED AS NEEDED FOR CHEST PAIN  6  . pantoprazole (PROTONIX) 40 MG tablet Take 1 tablet by mouth 2 (two) times daily.    . rosuvastatin (CRESTOR) 20 MG tablet Take 1 tablet by mouth daily.     No current facility-administered medications on file prior to visit.    Cardiovascular and other pertinent studies:  Lexiscan Tetrofosmin stress test 03/02/2021: Lexiscan nuclear stress test performed using 1-day protocol. Stress EKG is non-diagnostic, as this is pharmacological stress test. In addition, stress EKG at 82% MPHR showed sinus tachcyardia, RBBB, LAFB, anteroseptal T wave inversion related to RBBB. Normal myocardial perfusion. Stress LVEF 52%. Low risk study.   Echocardiogram 03/02/2021:  Normal LV systolic function with visual EF 60-65%. Left ventricle cavity  is normal in size.  Mild basal septal hypertrophy.  Normal global wall  motion. Normal diastolic filling pattern, normal LAP.  No significant valvular heart disease.  Compared to study 2018 no significant change.   EKG 02/11/2021: Sinus rhythm 71 bpm Right bundle branch block  CABGX3 2012 (Dr. Dorris Fetch): LIMA-LAD, left radial artery-PDA, SVG-OM1   Recent labs: 11/04/2020: Glucose 128, BUN/Cr 21/1.12. EGFR 77. Na/K 138/4.4. Rest of the CMP normal H/H 15/47. MCV 88. Platelets 281 HbA1C 6.0% Chol 266, TG 259, HDL 38, LDL 178 TSH N/A    Review of Systems  Cardiovascular:   Negative for chest pain, dyspnea on exertion, leg swelling, palpitations and syncope.  Musculoskeletal:  Positive for back pain.  Neurological:  Positive for paresthesias.        Vitals:   03/13/21 0858  BP: (!) 143/87  Pulse: 84  Resp: 16  Temp: 98.3 F (36.8 C)  SpO2: 95%     Body mass index is 30.56 kg/m. Filed Weights   03/13/21 0858  Weight: 201 lb (91.2 kg)     Objective:   Physical Exam Vitals and nursing note reviewed.  Constitutional:      General: He is not in acute distress. Neck:     Vascular: No JVD.  Cardiovascular:     Rate and Rhythm: Normal rate and regular rhythm.     Pulses: Normal pulses.     Heart sounds: Normal heart sounds. No murmur heard. Pulmonary:     Effort: Pulmonary effort is normal.     Breath sounds: Normal breath sounds. No wheezing or rales.  Musculoskeletal:     Right lower leg: No edema.     Left lower leg: No edema.       Assessment & Recommendations:    55 y.o. Caucasian male with hypertension, hyperlipidemia, type 2 diabetes mellitus, coronary artery disease s/p CABGX3 (2012), nicotine dependence  CAD: S/p CABGX3 (2012) No recent angina episodes. No ischemic on stress test (02/2021) Continue aspirin 81 mg daily. Lipids uncontrolled.  LDL 178. Started Rpeatha. Administered today in office.  Continue Crestor 20 mg daily.   Reasonable not to be on beta-blocker several years post CABG. Continue amlodipine.  Hypertension: Controlled  Mixed hyperlipidemia: As above Lipid panel in 3 months  Nicotine dependence: Tobacco cessation counseling:  - Currently smoking 0.5 packs/day   - Patient was informed of the dangers of tobacco abuse including stroke, cancer, and MI, as well as benefits of tobacco cessation. - Patient is willing to quit at this time. - Approximately 5 mins were spent counseling patient cessation techniques. We discussed various methods to help quit smoking, including deciding on a date to quit,  joining a support group, pharmacological agents. Patient would like to start Chantix. Starter pack prescription sent.  - I will reassess his progress at the next follow-up visit  Thank you for referring the patient to [place]. Please feel free to contact with any questions.   Elder Negus, MD Pager: [phone] Office: [phone]

## 2021-03-13 ENCOUNTER — Other Ambulatory Visit: Payer: Self-pay

## 2021-03-13 ENCOUNTER — Telehealth: Payer: Self-pay | Admitting: Pharmacist

## 2021-03-13 ENCOUNTER — Ambulatory Visit: Payer: Medicare Other | Admitting: Cardiology

## 2021-03-13 ENCOUNTER — Encounter: Payer: Self-pay | Admitting: Cardiology

## 2021-03-13 VITALS — BP 143/87 | HR 84 | Temp 98.3°F | Resp 16 | Ht 68.0 in | Wt 201.0 lb

## 2021-03-13 DIAGNOSIS — F17210 Nicotine dependence, cigarettes, uncomplicated: Secondary | ICD-10-CM

## 2021-03-13 DIAGNOSIS — I2510 Atherosclerotic heart disease of native coronary artery without angina pectoris: Secondary | ICD-10-CM | POA: Diagnosis not present

## 2021-03-13 DIAGNOSIS — J90 Pleural effusion, not elsewhere classified: Secondary | ICD-10-CM

## 2021-03-13 DIAGNOSIS — I10 Essential (primary) hypertension: Secondary | ICD-10-CM

## 2021-03-13 DIAGNOSIS — E782 Mixed hyperlipidemia: Secondary | ICD-10-CM

## 2021-03-13 MED ORDER — VARENICLINE TARTRATE 0.5 MG X 11 & 1 MG X 42 PO MISC: ORAL | 0 refills | Status: DC

## 2021-03-13 MED ORDER — REPATHA SURECLICK 140 MG/ML ~~LOC~~ SOAJ: 140.0000 mg | SUBCUTANEOUS | 2 refills | Status: DC

## 2021-03-13 MED ORDER — EVOLOCUMAB 140 MG/ML ~~LOC~~ SOAJ
140.0000 mg | Freq: Once | SUBCUTANEOUS | Status: DC
  Administered 2021-03-13: 140 mg via SUBCUTANEOUS

## 2021-03-13 NOTE — Telephone Encounter (Signed)
Repatha prefilled injection switched to [place]. Reviewed with Dr. Virgina Jock. Called [facility] to inquire rebilling for the [place]. Able to be rebilled and pt has a copay of $45. Same as the prefilled syringe. Reviewed with pt and pt agreeable to continue with the sureclick formulation.

## 2021-04-05 DIAGNOSIS — G8929 Other chronic pain: Secondary | ICD-10-CM | POA: Diagnosis not present

## 2021-04-05 DIAGNOSIS — I208 Other forms of angina pectoris: Secondary | ICD-10-CM | POA: Diagnosis not present

## 2021-04-05 DIAGNOSIS — K219 Gastro-esophageal reflux disease without esophagitis: Secondary | ICD-10-CM | POA: Diagnosis not present

## 2021-04-05 DIAGNOSIS — E1169 Type 2 diabetes mellitus with other specified complication: Secondary | ICD-10-CM | POA: Diagnosis not present

## 2021-04-05 DIAGNOSIS — M179 Osteoarthritis of knee, unspecified: Secondary | ICD-10-CM | POA: Diagnosis not present

## 2021-04-05 DIAGNOSIS — I10 Essential (primary) hypertension: Secondary | ICD-10-CM | POA: Diagnosis not present

## 2021-04-05 DIAGNOSIS — E782 Mixed hyperlipidemia: Secondary | ICD-10-CM | POA: Diagnosis not present

## 2021-04-05 DIAGNOSIS — E7800 Pure hypercholesterolemia, unspecified: Secondary | ICD-10-CM | POA: Diagnosis not present

## 2021-04-05 DIAGNOSIS — I2510 Atherosclerotic heart disease of native coronary artery without angina pectoris: Secondary | ICD-10-CM | POA: Diagnosis not present

## 2021-04-05 DIAGNOSIS — I63511 Cerebral infarction due to unspecified occlusion or stenosis of right middle cerebral artery: Secondary | ICD-10-CM | POA: Diagnosis not present

## 2021-04-05 DIAGNOSIS — E119 Type 2 diabetes mellitus without complications: Secondary | ICD-10-CM | POA: Diagnosis not present

## 2021-04-27 DIAGNOSIS — E1169 Type 2 diabetes mellitus with other specified complication: Secondary | ICD-10-CM | POA: Diagnosis not present

## 2021-04-27 DIAGNOSIS — I10 Essential (primary) hypertension: Secondary | ICD-10-CM | POA: Diagnosis not present

## 2021-04-27 DIAGNOSIS — I208 Other forms of angina pectoris: Secondary | ICD-10-CM | POA: Diagnosis not present

## 2021-04-27 DIAGNOSIS — E7800 Pure hypercholesterolemia, unspecified: Secondary | ICD-10-CM | POA: Diagnosis not present

## 2021-04-27 DIAGNOSIS — M179 Osteoarthritis of knee, unspecified: Secondary | ICD-10-CM | POA: Diagnosis not present

## 2021-04-27 DIAGNOSIS — E782 Mixed hyperlipidemia: Secondary | ICD-10-CM | POA: Diagnosis not present

## 2021-04-27 DIAGNOSIS — E119 Type 2 diabetes mellitus without complications: Secondary | ICD-10-CM | POA: Diagnosis not present

## 2021-04-27 DIAGNOSIS — G8929 Other chronic pain: Secondary | ICD-10-CM | POA: Diagnosis not present

## 2021-04-27 DIAGNOSIS — K219 Gastro-esophageal reflux disease without esophagitis: Secondary | ICD-10-CM | POA: Diagnosis not present

## 2021-04-27 DIAGNOSIS — I2510 Atherosclerotic heart disease of native coronary artery without angina pectoris: Secondary | ICD-10-CM | POA: Diagnosis not present

## 2021-05-15 DIAGNOSIS — H2513 Age-related nuclear cataract, bilateral: Secondary | ICD-10-CM | POA: Diagnosis not present

## 2021-05-15 DIAGNOSIS — H1045 Other chronic allergic conjunctivitis: Secondary | ICD-10-CM | POA: Diagnosis not present

## 2021-05-15 DIAGNOSIS — H35033 Hypertensive retinopathy, bilateral: Secondary | ICD-10-CM | POA: Diagnosis not present

## 2021-05-15 DIAGNOSIS — E119 Type 2 diabetes mellitus without complications: Secondary | ICD-10-CM | POA: Diagnosis not present

## 2021-06-10 DIAGNOSIS — E119 Type 2 diabetes mellitus without complications: Secondary | ICD-10-CM | POA: Diagnosis not present

## 2021-06-10 DIAGNOSIS — E7800 Pure hypercholesterolemia, unspecified: Secondary | ICD-10-CM | POA: Diagnosis not present

## 2021-06-10 DIAGNOSIS — I2510 Atherosclerotic heart disease of native coronary artery without angina pectoris: Secondary | ICD-10-CM | POA: Diagnosis not present

## 2021-06-10 DIAGNOSIS — I63511 Cerebral infarction due to unspecified occlusion or stenosis of right middle cerebral artery: Secondary | ICD-10-CM | POA: Diagnosis not present

## 2021-06-10 DIAGNOSIS — K219 Gastro-esophageal reflux disease without esophagitis: Secondary | ICD-10-CM | POA: Diagnosis not present

## 2021-06-10 DIAGNOSIS — E782 Mixed hyperlipidemia: Secondary | ICD-10-CM | POA: Diagnosis not present

## 2021-06-10 DIAGNOSIS — M179 Osteoarthritis of knee, unspecified: Secondary | ICD-10-CM | POA: Diagnosis not present

## 2021-06-10 DIAGNOSIS — G8929 Other chronic pain: Secondary | ICD-10-CM | POA: Diagnosis not present

## 2021-06-10 DIAGNOSIS — I10 Essential (primary) hypertension: Secondary | ICD-10-CM | POA: Diagnosis not present

## 2021-06-10 DIAGNOSIS — E1169 Type 2 diabetes mellitus with other specified complication: Secondary | ICD-10-CM | POA: Diagnosis not present

## 2021-06-15 ENCOUNTER — Other Ambulatory Visit: Payer: Self-pay | Admitting: Family Medicine

## 2021-06-15 DIAGNOSIS — R911 Solitary pulmonary nodule: Secondary | ICD-10-CM

## 2021-06-24 ENCOUNTER — Ambulatory Visit: Payer: Medicare Other | Admitting: Cardiology

## 2021-07-14 NOTE — Progress Notes (Deleted)
Patient referred by Joycelyn Rua, MD for coronary artery disease  Subjective:   Brandon Martin, male    DOB: [birth year], 56 y.o.   MRN: [id number]  *** No chief complaint on file.    HPI  56 y.o. Caucasian male with hypertension, hyperlipidemia, type 2 diabetes mellitus, coronary artery disease s/p CABGX3 (2012), nicotine dependence  Patient is doing well, denies chest pain, shortness of breath, palpitations, leg edema, orthopnea, PND, TIA/syncope. Reviewed recent stress test results with the patient, details below. Patient has not started Repatha yet. Unfortunately, he continues ot smoke 1/2 pack per day.    Initial consultation HPI 02/2021: Patient had an episode of unstable angina in 2012, associated with mild chest pain and severe diaphoresis that led to coronary angiography showing severe multivessel disease.  He underwent CABG x3 by Dr. Dorris Fetch in 2012.  He has not seen a cardiologist in several years since then.  Patient is here with his brother today.  Patient is on disability from back surgery since 1999.  He lives at home with his mother, takes care of her.  He works in the yard with mowing, proudly states that he keeps his yard clean.  On exertion, he does not have any specific chest pain or dyspnea symptoms.  1 week ago, he had an episode of chest pain that relieved with nitroglycerin, at rest.  Unfortunately, he continues to smoke half pack a day.  Prior to his CABG, he used to smoke 2 packs a day.  He is not ready to quit yet.  He is compliant with his medical therapy.  Lipids remain uncontrolled.    Current Outpatient Medications on File Prior to Visit  Medication Sig Dispense Refill   amLODipine (NORVASC) 10 MG tablet Take 1 tablet (10 mg total) by mouth daily. 30 tablet 1   aspirin 81 MG chewable tablet 1 tablet     cyclobenzaprine (FLEXERIL) 5 MG tablet 1 tablet at bedtime, up to 3 times a day     Evolocumab (REPATHA SURECLICK) 140 MG/ML SOAJ Inject 140 mg  into the skin every 14 (fourteen) days. 6 mL 2   gabapentin (NEURONTIN) 300 MG capsule Take 2 capsules (600 mg total) by mouth 2 (two) times daily. 180 capsule 3   metFORMIN (GLUCOPHAGE) 500 MG tablet TAKE 2 TABLETS IN THE MORNING WITH A MEAL, 1 TABLET IN THE EVENING WITH A MEAL     nitroGLYCERIN (NITROSTAT) 0.4 MG SL tablet PLACE 1 TABLET UNDER TONGUE AS DIRECTED AS NEEDED FOR CHEST PAIN  6   pantoprazole (PROTONIX) 40 MG tablet Take 1 tablet by mouth 2 (two) times daily.     rosuvastatin (CRESTOR) 20 MG tablet Take 1 tablet by mouth daily.     varenicline (CHANTIX STARTING MONTH PAK) 0.5 MG X 11 & 1 MG X 42 tablet Take one 0.5 mg tablet by mouth once daily for 3 days, then increase to one 0.5 mg tablet twice daily for 4 days, then increase to one 1 mg tablet twice daily. 53 tablet 0   No current facility-administered medications on file prior to visit.    Cardiovascular and other pertinent studies:  Lexiscan Tetrofosmin stress test 03/02/2021: Lexiscan nuclear stress test performed using 1-day protocol. Stress EKG is non-diagnostic, as this is pharmacological stress test. In addition, stress EKG at 82% MPHR showed sinus tachcyardia, RBBB, LAFB, anteroseptal T wave inversion related to RBBB. Normal myocardial perfusion. Stress LVEF 52%. Low risk study.   Echocardiogram 03/02/2021:  Normal LV  systolic function with visual EF 60-65%. Left ventricle cavity  is normal in size. Mild basal septal hypertrophy.  Normal global wall  motion. Normal diastolic filling pattern, normal LAP.  No significant valvular heart disease.  Compared to study 2018 no significant change.   EKG 02/11/2021: Sinus rhythm 71 bpm Right bundle branch block  CABGX3 2012 (Dr. Dorris Fetch): LIMA-LAD, left radial artery-PDA, SVG-OM1   Recent labs: 11/04/2020: Glucose 128, BUN/Cr 21/1.12. EGFR 77. Na/K 138/4.4. Rest of the CMP normal H/H 15/47. MCV 88. Platelets 281 HbA1C 6.0% Chol 266, TG 259, HDL 38, LDL 178 TSH  N/A    Review of Systems  Cardiovascular:  Negative for chest pain, dyspnea on exertion, leg swelling, palpitations and syncope.  Musculoskeletal:  Positive for back pain.  Neurological:  Positive for paresthesias.        There were no vitals filed for this visit.    There is no height or weight on file to calculate BMI. There were no vitals filed for this visit.    Objective:   Physical Exam Vitals and nursing note reviewed.  Constitutional:      General: He is not in acute distress. Neck:     Vascular: No JVD.  Cardiovascular:     Rate and Rhythm: Normal rate and regular rhythm.     Pulses: Normal pulses.     Heart sounds: Normal heart sounds. No murmur heard. Pulmonary:     Effort: Pulmonary effort is normal.     Breath sounds: Normal breath sounds. No wheezing or rales.  Musculoskeletal:     Right lower leg: No edema.     Left lower leg: No edema.       Assessment & Recommendations:    56 y.o. Caucasian male with hypertension, hyperlipidemia, type 2 diabetes mellitus, coronary artery disease s/p CABGX3 (2012), nicotine dependence  *** CAD: S/p CABGX3 (2012) No recent angina episodes. No ischemic on stress test (02/2021) Continue aspirin 81 mg daily. Lipids uncontrolled.  LDL 178. Started Rpeatha. Administered today in office.  Continue Crestor 20 mg daily.   Reasonable not to be on beta-blocker several years post CABG. Continue amlodipine.  Hypertension: Controlled  Mixed hyperlipidemia: As above Lipid panel in 3 months  Nicotine dependence: Tobacco cessation counseling:  - Currently smoking 0.5 packs/day   - Patient was informed of the dangers of tobacco abuse including stroke, cancer, and MI, as well as benefits of tobacco cessation. - Patient is willing to quit at this time. - Approximately 5 mins were spent counseling patient cessation techniques. We discussed various methods to help quit smoking, including deciding on a date to quit,  joining a support group, pharmacological agents. Patient would like to start Chantix. Starter pack prescription sent.  - I will reassess his progress at the next follow-up visit  Thank you for referring the patient to [place]. Please feel free to contact with any questions.   Elder Negus, MD Pager: [phone] Office: [phone]

## 2021-07-15 ENCOUNTER — Ambulatory Visit: Payer: Medicare Other | Admitting: Cardiology

## 2021-07-21 DIAGNOSIS — E782 Mixed hyperlipidemia: Secondary | ICD-10-CM | POA: Diagnosis not present

## 2021-07-22 ENCOUNTER — Other Ambulatory Visit: Payer: Self-pay | Admitting: Podiatry

## 2021-07-22 LAB — LIPID PANEL
Chol/HDL Ratio: 3.2 ratio (ref 0.0–5.0)
Cholesterol, Total: 112 mg/dL (ref 100–199)
HDL: 35 mg/dL — ABNORMAL LOW (ref >39)
LDL Chol Calc (NIH): 52 mg/dL (ref 0–99)
Triglycerides: 143 mg/dL (ref 0–149)
VLDL Cholesterol Cal: 25 mg/dL (ref 5–40)

## 2021-07-22 NOTE — Telephone Encounter (Signed)
Please advise 

## 2021-07-29 ENCOUNTER — Other Ambulatory Visit: Payer: Self-pay

## 2021-07-29 DIAGNOSIS — E782 Mixed hyperlipidemia: Secondary | ICD-10-CM

## 2021-07-29 MED ORDER — REPATHA SURECLICK 140 MG/ML ~~LOC~~ SOAJ: 140.0000 mg | SUBCUTANEOUS | 2 refills | Status: DC

## 2021-07-30 NOTE — Progress Notes (Signed)
Patient referred by Joycelyn Rua, MD for coronary artery disease  Subjective:   Brandon Martin, male    DOB: 02/23/1966, 56 y.o.   MRN: [id number]   Chief Complaint  Patient presents with   Coronary Artery Disease   Hyperlipidemia   Follow-up    HPI  56 y.o. Caucasian male with hypertension, hyperlipidemia, type 2 diabetes mellitus, coronary artery disease s/p CABGX3 (2012), nicotine dependence  Patient is doing well, denies chest pain, shortness of breath, palpitations, leg edema, orthopnea, PND, TIA/syncope. Reviewed recent stress test results with the patient, details below. Patient has not started Repatha yet. Unfortunately, he still continues ot smoke 1/2 pack per day, and is not ready to quit yet.  Today he tells me, "I used to drink until the left me alone and then I quit drinking", probably implying that we should leave him alone about his tobacco dependence.   Initial consultation HPI 02/2021: Patient had an episode of unstable angina in 2012, associated with mild chest pain and severe diaphoresis that led to coronary angiography showing severe multivessel disease.  He underwent CABG x3 by Dr. Dorris Fetch in 2012.  He has not seen a cardiologist in several years since then.  Patient is here with his brother today.  Patient is on disability from back surgery since 1999.  He lives at home with his mother, takes care of her.  He works in the yard with mowing, proudly states that he keeps his yard clean.  On exertion, he does not have any specific chest pain or dyspnea symptoms.  1 week ago, he had an episode of chest pain that relieved with nitroglycerin, at rest.  Unfortunately, he continues to smoke half pack a day.  Prior to his CABG, he used to smoke 2 packs a day.  He is not ready to quit yet.  He is compliant with his medical therapy.  Lipids remain uncontrolled.    Current Outpatient Medications on File Prior to Visit  Medication Sig Dispense Refill   amLODipine  (NORVASC) 10 MG tablet Take 1 tablet (10 mg total) by mouth daily. 30 tablet 1   aspirin 81 MG chewable tablet 1 tablet     cyclobenzaprine (FLEXERIL) 5 MG tablet 1 tablet at bedtime, up to 3 times a day     Evolocumab (REPATHA SURECLICK) 140 MG/ML SOAJ Inject 140 mg into the skin every 14 (fourteen) days. 6 mL 2   gabapentin (NEURONTIN) 300 MG capsule TAKE TWO CAPSULES BY MOUTH TWICE DAILY 180 capsule 3   metFORMIN (GLUCOPHAGE) 500 MG tablet TAKE 2 TABLETS IN THE MORNING WITH A MEAL, 1 TABLET IN THE EVENING WITH A MEAL     nitroGLYCERIN (NITROSTAT) 0.4 MG SL tablet PLACE 1 TABLET UNDER TONGUE AS DIRECTED AS NEEDED FOR CHEST PAIN  6   pantoprazole (PROTONIX) 40 MG tablet Take 1 tablet by mouth 2 (two) times daily.     rosuvastatin (CRESTOR) 20 MG tablet Take 1 tablet by mouth daily.     varenicline (CHANTIX STARTING MONTH PAK) 0.5 MG X 11 & 1 MG X 42 tablet Take one 0.5 mg tablet by mouth once daily for 3 days, then increase to one 0.5 mg tablet twice daily for 4 days, then increase to one 1 mg tablet twice daily. 53 tablet 0   No current facility-administered medications on file prior to visit.    Cardiovascular and other pertinent studies:  EKG 07/31/2021: Sinus rhythm 81 bpm  Right bundle branch block  Lexiscan  Tetrofosmin stress test 03/02/2021: Lexiscan nuclear stress test performed using 1-day protocol. Stress EKG is non-diagnostic, as this is pharmacological stress test. In addition, stress EKG at 82% MPHR showed sinus tachcyardia, RBBB, LAFB, anteroseptal T wave inversion related to RBBB. Normal myocardial perfusion. Stress LVEF 52%. Low risk study.   Echocardiogram 03/02/2021:  Normal LV systolic function with visual EF 60-65%. Left ventricle cavity  is normal in size. Mild basal septal hypertrophy.  Normal global wall  motion. Normal diastolic filling pattern, normal LAP.  No significant valvular heart disease.  Compared to study 2018 no significant change.   EKG  02/11/2021: Sinus rhythm 71 bpm Right bundle branch block  CABGX3 2012 (Dr. Dorris Fetch): LIMA-LAD, left radial artery-PDA, SVG-OM1   Recent labs: 07/21/2021: Chol 112, TG 143, HDL 35, LDL 52  11/04/2020: Glucose 128, BUN/Cr 21/1.12. EGFR 77. Na/K 138/4.4. Rest of the CMP normal H/H 15/47. MCV 88. Platelets 281 HbA1C 6.0% Chol 266, TG 259, HDL 38, LDL 178 TSH N/A    Review of Systems  Cardiovascular:  Negative for chest pain, dyspnea on exertion, leg swelling, palpitations and syncope.  Musculoskeletal:  Positive for back pain.  Neurological:  Positive for paresthesias.        Vitals:   07/31/21 0952  BP: 131/87  Pulse: 82  Resp: 16  Temp: 98 F (36.7 C)  SpO2: 95%     Body mass index is 29.19 kg/m. Filed Weights   07/31/21 0952  Weight: 192 lb (87.1 kg)     Objective:   Physical Exam Vitals and nursing note reviewed.  Constitutional:      General: He is not in acute distress. Neck:     Vascular: No JVD.  Cardiovascular:     Rate and Rhythm: Normal rate and regular rhythm.     Pulses: Normal pulses.     Heart sounds: Normal heart sounds. No murmur heard. Pulmonary:     Effort: Pulmonary effort is normal.     Breath sounds: Normal breath sounds. No wheezing or rales.  Musculoskeletal:     Right lower leg: No edema.     Left lower leg: No edema.       Assessment & Recommendations:    56 y.o. Caucasian male with hypertension, hyperlipidemia, type 2 diabetes mellitus, coronary artery disease s/p CABGX3 (2012), nicotine dependence  CAD: S/p CABGX3 (2012) No recent angina episodes. No ischemic on stress test (02/2021) Continue aspirin 81 mg daily. LDL down to 52 on Repatha and Crestor 20 mg daily.   Reasonable not to be on beta-blocker several years post CABG. Continue amlodipine.  Hypertension: Controlled  Mixed hyperlipidemia: As above  Nicotine dependence: Tobacco cessation counseling:  - Currently smoking 1/2 packs/day   - Patient  was informed of the dangers of tobacco abuse including stroke, cancer, and MI, as well as benefits of tobacco cessation. - Patient is NOT willing to quit at this time. - Approximately 5 mins were spent counseling patient cessation techniques. We discussed various methods to help quit smoking, including deciding on a date to quit, joining a support group, pharmacological agents. - I will reassess his progress at the next follow-up visit   Thank you for referring the patient to [place]. Please feel free to contact with any questions.   Elder Negus, MD Pager: [phone] Office: [phone]

## 2021-07-31 ENCOUNTER — Encounter: Payer: Self-pay | Admitting: Cardiology

## 2021-07-31 ENCOUNTER — Other Ambulatory Visit: Payer: Self-pay

## 2021-07-31 ENCOUNTER — Ambulatory Visit: Payer: Medicare Other | Admitting: Cardiology

## 2021-07-31 VITALS — BP 131/87 | HR 82 | Temp 98.0°F | Resp 16 | Ht 68.0 in | Wt 192.0 lb

## 2021-07-31 DIAGNOSIS — I10 Essential (primary) hypertension: Secondary | ICD-10-CM

## 2021-07-31 DIAGNOSIS — I2510 Atherosclerotic heart disease of native coronary artery without angina pectoris: Secondary | ICD-10-CM

## 2021-07-31 DIAGNOSIS — F17210 Nicotine dependence, cigarettes, uncomplicated: Secondary | ICD-10-CM | POA: Diagnosis not present

## 2021-07-31 DIAGNOSIS — E782 Mixed hyperlipidemia: Secondary | ICD-10-CM | POA: Diagnosis not present

## 2021-10-12 ENCOUNTER — Other Ambulatory Visit: Payer: Self-pay | Admitting: Cardiology

## 2021-10-12 DIAGNOSIS — E782 Mixed hyperlipidemia: Secondary | ICD-10-CM

## 2021-10-22 DIAGNOSIS — E782 Mixed hyperlipidemia: Secondary | ICD-10-CM | POA: Diagnosis not present

## 2021-10-22 DIAGNOSIS — E1169 Type 2 diabetes mellitus with other specified complication: Secondary | ICD-10-CM | POA: Diagnosis not present

## 2021-10-22 DIAGNOSIS — I10 Essential (primary) hypertension: Secondary | ICD-10-CM | POA: Diagnosis not present

## 2021-10-22 DIAGNOSIS — K219 Gastro-esophageal reflux disease without esophagitis: Secondary | ICD-10-CM | POA: Diagnosis not present

## 2021-10-22 DIAGNOSIS — G8929 Other chronic pain: Secondary | ICD-10-CM | POA: Diagnosis not present

## 2021-10-22 DIAGNOSIS — I2510 Atherosclerotic heart disease of native coronary artery without angina pectoris: Secondary | ICD-10-CM | POA: Diagnosis not present

## 2022-01-14 ENCOUNTER — Other Ambulatory Visit: Payer: Self-pay | Admitting: Podiatry

## 2022-01-24 DIAGNOSIS — I10 Essential (primary) hypertension: Secondary | ICD-10-CM | POA: Diagnosis not present

## 2022-01-24 DIAGNOSIS — E1169 Type 2 diabetes mellitus with other specified complication: Secondary | ICD-10-CM | POA: Diagnosis not present

## 2022-01-24 DIAGNOSIS — E782 Mixed hyperlipidemia: Secondary | ICD-10-CM | POA: Diagnosis not present

## 2022-01-24 DIAGNOSIS — K219 Gastro-esophageal reflux disease without esophagitis: Secondary | ICD-10-CM | POA: Diagnosis not present

## 2022-02-19 DIAGNOSIS — Z0000 Encounter for general adult medical examination without abnormal findings: Secondary | ICD-10-CM | POA: Diagnosis not present

## 2022-02-19 DIAGNOSIS — I2510 Atherosclerotic heart disease of native coronary artery without angina pectoris: Secondary | ICD-10-CM | POA: Diagnosis not present

## 2022-02-19 DIAGNOSIS — K219 Gastro-esophageal reflux disease without esophagitis: Secondary | ICD-10-CM | POA: Diagnosis not present

## 2022-02-19 DIAGNOSIS — D124 Benign neoplasm of descending colon: Secondary | ICD-10-CM | POA: Diagnosis not present

## 2022-02-19 DIAGNOSIS — E119 Type 2 diabetes mellitus without complications: Secondary | ICD-10-CM | POA: Diagnosis not present

## 2022-02-19 DIAGNOSIS — E782 Mixed hyperlipidemia: Secondary | ICD-10-CM | POA: Diagnosis not present

## 2022-02-19 DIAGNOSIS — J438 Other emphysema: Secondary | ICD-10-CM | POA: Diagnosis not present

## 2022-02-19 DIAGNOSIS — I10 Essential (primary) hypertension: Secondary | ICD-10-CM | POA: Diagnosis not present

## 2022-02-19 DIAGNOSIS — I63511 Cerebral infarction due to unspecified occlusion or stenosis of right middle cerebral artery: Secondary | ICD-10-CM | POA: Diagnosis not present

## 2022-02-19 DIAGNOSIS — R911 Solitary pulmonary nodule: Secondary | ICD-10-CM | POA: Diagnosis not present

## 2022-02-19 DIAGNOSIS — Z23 Encounter for immunization: Secondary | ICD-10-CM | POA: Diagnosis not present

## 2022-02-23 ENCOUNTER — Other Ambulatory Visit: Payer: Self-pay | Admitting: Family Medicine

## 2022-02-23 DIAGNOSIS — R911 Solitary pulmonary nodule: Secondary | ICD-10-CM

## 2022-02-24 IMAGING — CT CT CHEST LUNG CANCER SCREENING LOW DOSE W/O CM
2 of 5 series · 15 of 40 positions shown, 18 images · non-contrast
Comparison: None.

CLINICAL DATA: Lung cancer screening. Current smoker. Twenty-eight
pack-year history. Asymptomatic.

EXAM:
CT CHEST WITHOUT CONTRAST LOW-DOSE FOR LUNG CANCER SCREENING
TECHNIQUE: Multidetector CT imaging of the chest was performed following the
standard protocol without IV contrast.

[Series 4: lung 1.00 br44 cor · coronal · 0.59mm/px · 3 of 407 slices shown]
[im 82/407  lung]
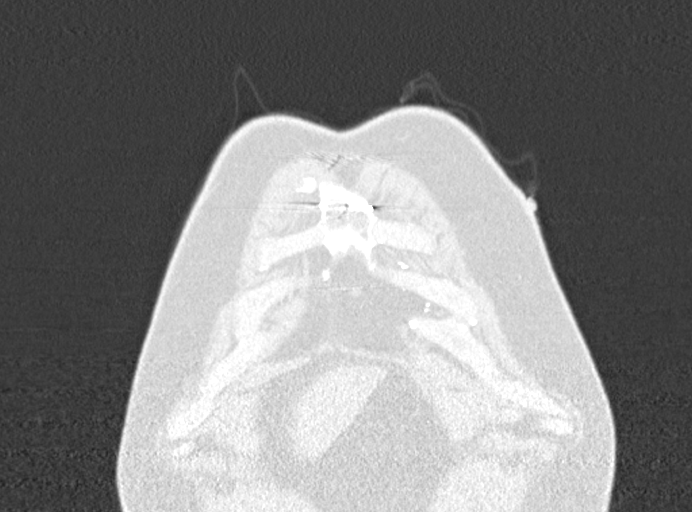
[im 163/407  lung]
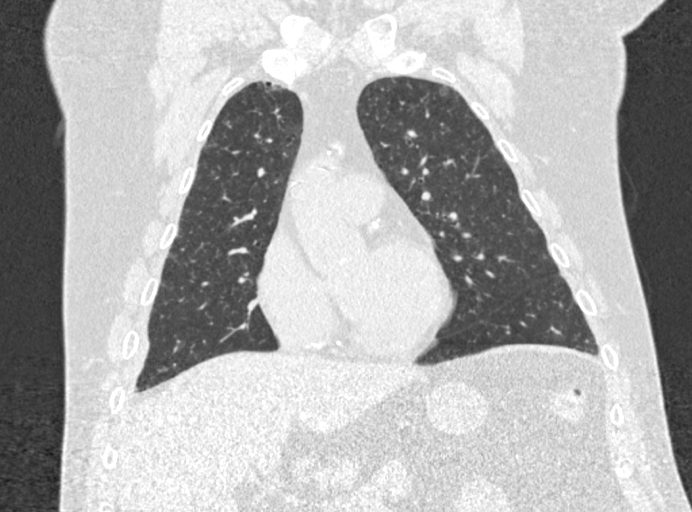
[im 244/407  lung]
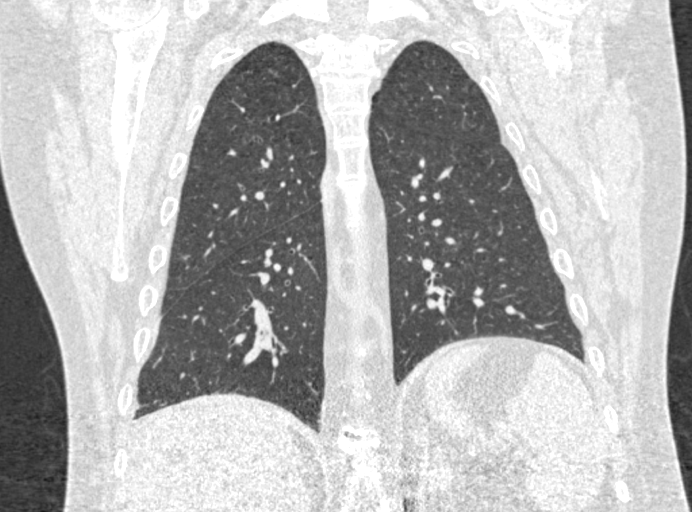

[Series 9: lung 1.00 br60 axial · axial · 0.80mm/px · z∈[-1230,-957]mm · 12 of 301 slices shown, 15 images]
[im 14/301  mediastinal]
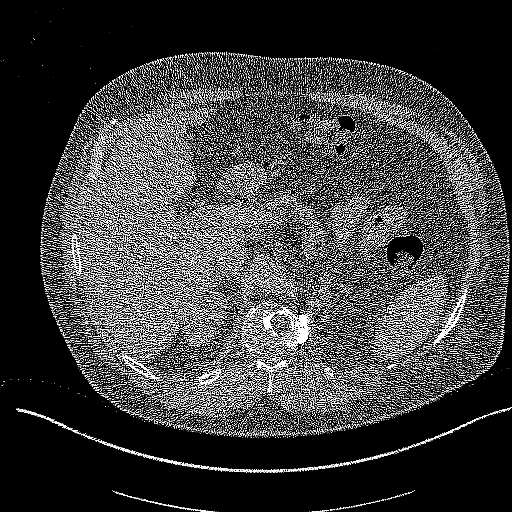
[im 14/301  lung]
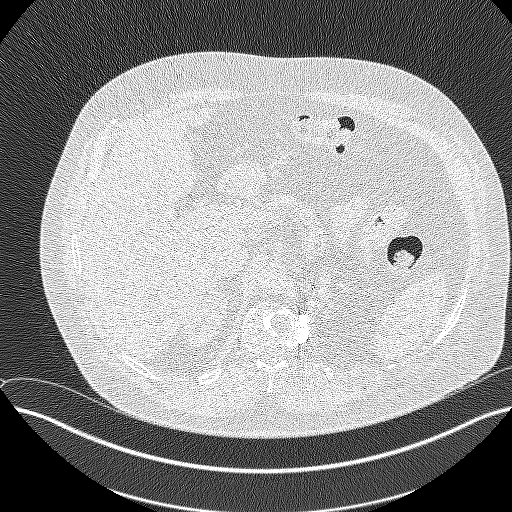
[im 41/301  lung]
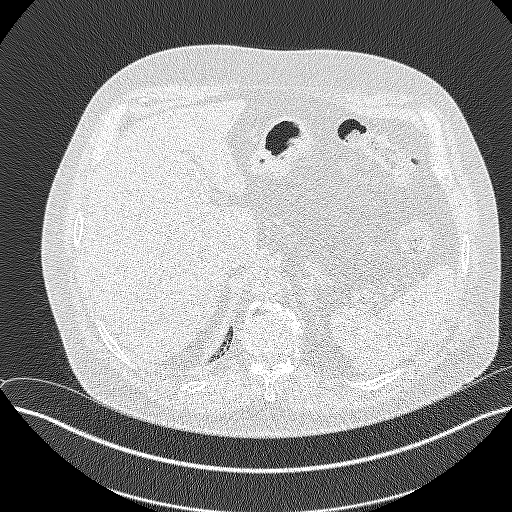
[im 69/301  lung]
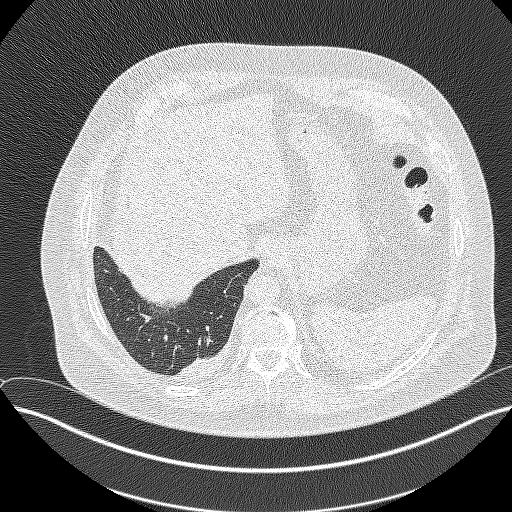
[im 96/301  lung]
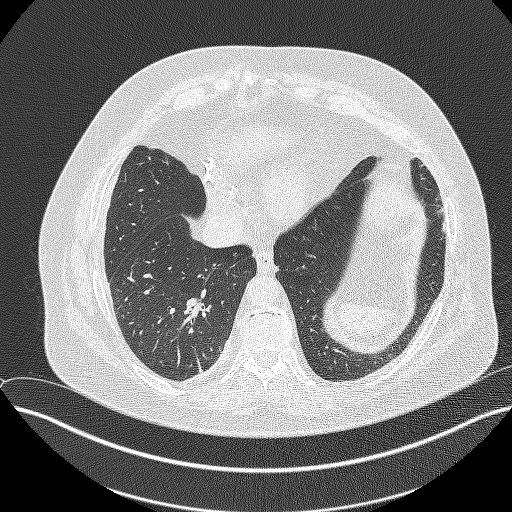
[im 110/301  mediastinal]
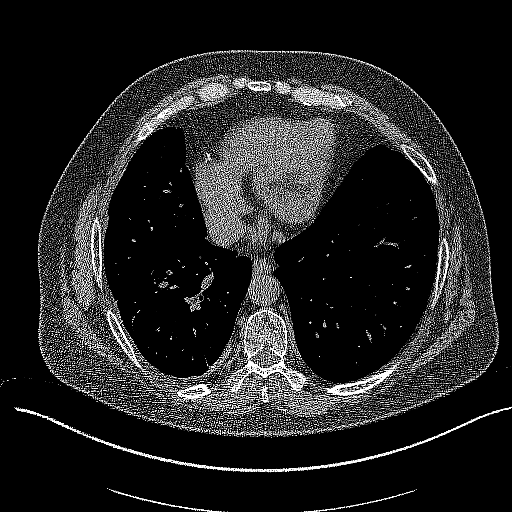
[im 110/301  lung]
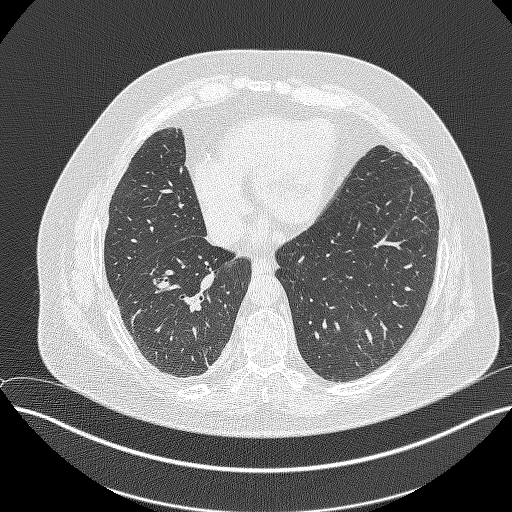
[im 137/301  lung]
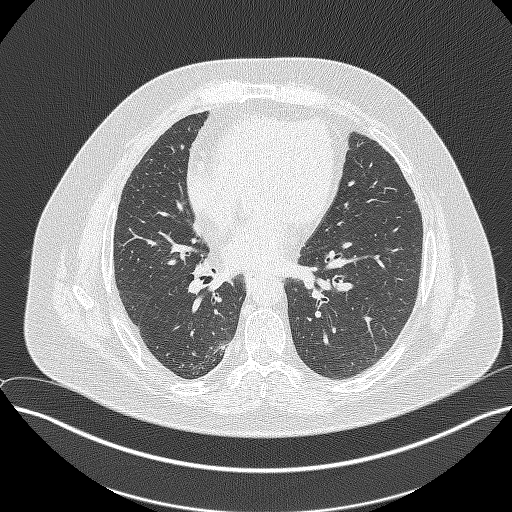
[im 164/301  lung]
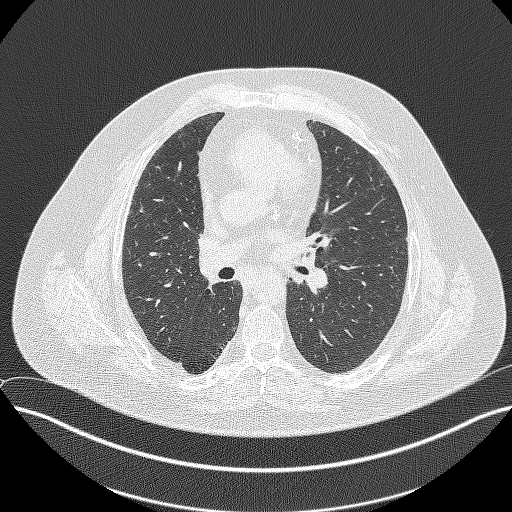
[im 191/301  lung]
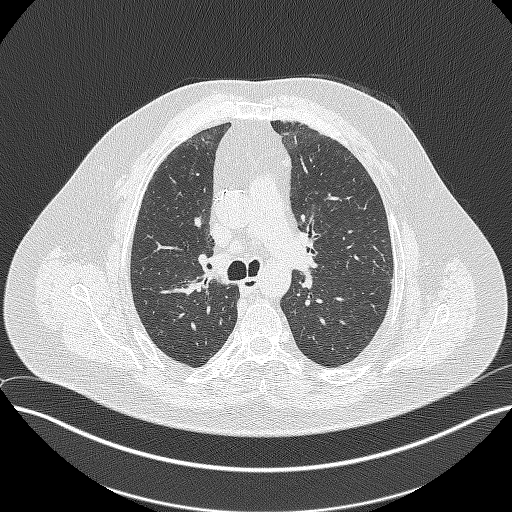
[im 205/301  mediastinal]
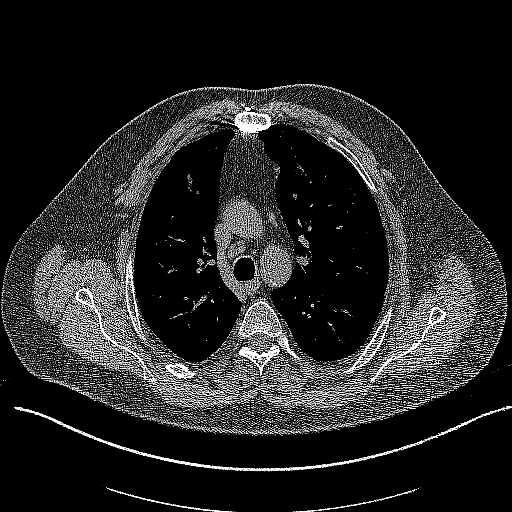
[im 205/301  lung]
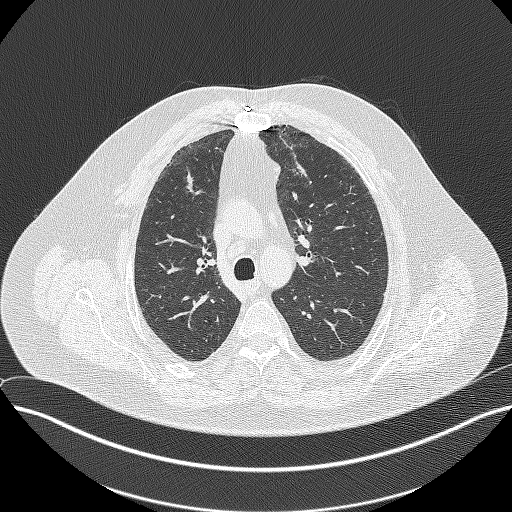
[im 232/301  lung]
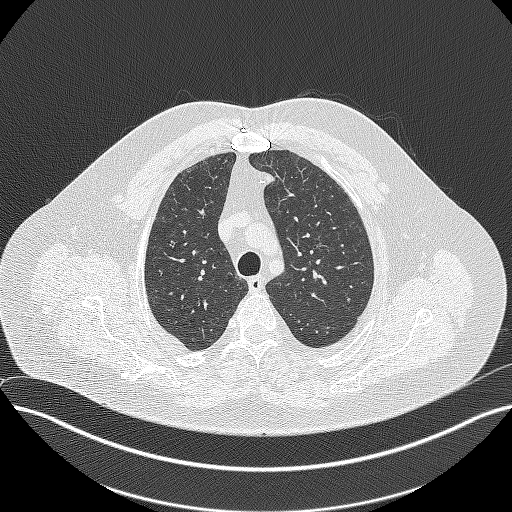
[im 260/301  lung]
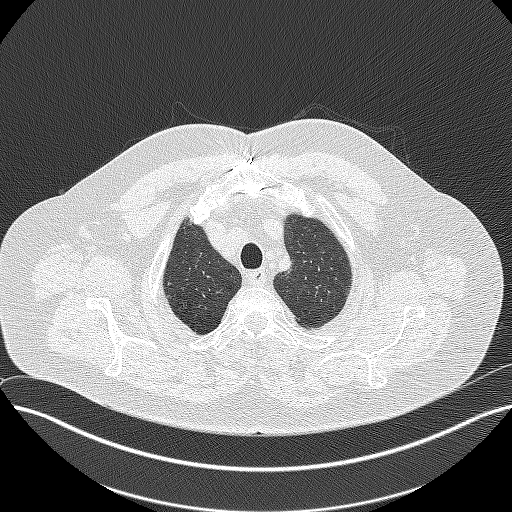
[im 287/301  lung]
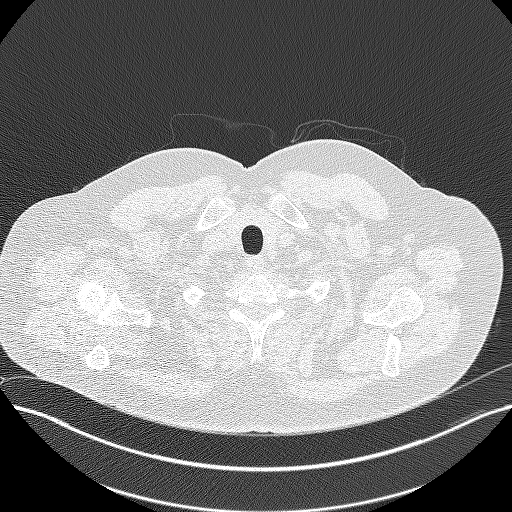

[15 of 40 positions shown; findings below may reference images not displayed]

FINDINGS: Cardiovascular: Normal heart size. No pericardial effusion. Previous
median sternotomy and CABG procedure. Aortic atherosclerosis.

Mediastinum/Nodes: No enlarged mediastinal, hilar, or axillary lymph
nodes. Thyroid gland, trachea, and esophagus demonstrate no
significant findings.

Lungs/Pleura: Diffuse bronchial wall thickening. Centrilobular and
paraseptal emphysema. No pleural effusion, airspace consolidation or
pneumothorax. Posterior pleural thickening overlies the right lung
base.Several pulmonary nodules are identified. The largest has a
lobulated appearance and is in the subpleural aspect of the left
lower lobe. This has a mean derived diameter of 7.9 mm.

Upper Abdomen: No acute findings.

Musculoskeletal: Previous median sternotomy. Postoperative changes
identified within the lower thoracic and lumbar spine. No acute or
suspicious osseous findings.
IMPRESSION: 1. Lung-RADS 3, probably benign findings. Short-term follow-up in 6
months is recommended with repeat low-dose chest CT without contrast
(please use the following order, "CT CHEST LCS NODULE FOLLOW-UP W/O
CM").
2. Diffuse bronchial wall thickening with emphysema, as above;
imaging findings suggestive of underlying COPD.
3. Status post CABG

Aortic Atherosclerosis (2TGTJ-C06.6) and Emphysema (2TGTJ-7NM.H).

## 2022-03-16 DIAGNOSIS — M25562 Pain in left knee: Secondary | ICD-10-CM | POA: Diagnosis not present

## 2022-03-30 DIAGNOSIS — M1712 Unilateral primary osteoarthritis, left knee: Secondary | ICD-10-CM | POA: Diagnosis not present

## 2022-03-30 DIAGNOSIS — M25562 Pain in left knee: Secondary | ICD-10-CM | POA: Diagnosis not present

## 2022-04-15 DIAGNOSIS — E119 Type 2 diabetes mellitus without complications: Secondary | ICD-10-CM | POA: Diagnosis not present

## 2022-04-20 DIAGNOSIS — I10 Essential (primary) hypertension: Secondary | ICD-10-CM | POA: Diagnosis not present

## 2022-04-20 DIAGNOSIS — E7800 Pure hypercholesterolemia, unspecified: Secondary | ICD-10-CM | POA: Diagnosis not present

## 2022-04-20 DIAGNOSIS — E1169 Type 2 diabetes mellitus with other specified complication: Secondary | ICD-10-CM | POA: Diagnosis not present

## 2022-04-20 DIAGNOSIS — K219 Gastro-esophageal reflux disease without esophagitis: Secondary | ICD-10-CM | POA: Diagnosis not present

## 2022-06-02 DIAGNOSIS — I10 Essential (primary) hypertension: Secondary | ICD-10-CM | POA: Diagnosis not present

## 2022-06-02 DIAGNOSIS — M179 Osteoarthritis of knee, unspecified: Secondary | ICD-10-CM | POA: Diagnosis not present

## 2022-06-02 DIAGNOSIS — G8929 Other chronic pain: Secondary | ICD-10-CM | POA: Diagnosis not present

## 2022-06-02 DIAGNOSIS — E7800 Pure hypercholesterolemia, unspecified: Secondary | ICD-10-CM | POA: Diagnosis not present

## 2022-06-02 DIAGNOSIS — K219 Gastro-esophageal reflux disease without esophagitis: Secondary | ICD-10-CM | POA: Diagnosis not present

## 2022-06-02 DIAGNOSIS — I2510 Atherosclerotic heart disease of native coronary artery without angina pectoris: Secondary | ICD-10-CM | POA: Diagnosis not present

## 2022-06-02 DIAGNOSIS — E119 Type 2 diabetes mellitus without complications: Secondary | ICD-10-CM | POA: Diagnosis not present

## 2022-06-02 DIAGNOSIS — J438 Other emphysema: Secondary | ICD-10-CM | POA: Diagnosis not present

## 2022-07-09 DIAGNOSIS — M25562 Pain in left knee: Secondary | ICD-10-CM | POA: Diagnosis not present

## 2022-07-15 ENCOUNTER — Other Ambulatory Visit: Payer: Self-pay | Admitting: Podiatry

## 2022-07-16 ENCOUNTER — Other Ambulatory Visit: Payer: Self-pay | Admitting: Podiatry

## 2022-07-21 DIAGNOSIS — I10 Essential (primary) hypertension: Secondary | ICD-10-CM | POA: Diagnosis not present

## 2022-07-21 DIAGNOSIS — E7800 Pure hypercholesterolemia, unspecified: Secondary | ICD-10-CM | POA: Diagnosis not present

## 2022-07-21 DIAGNOSIS — E119 Type 2 diabetes mellitus without complications: Secondary | ICD-10-CM | POA: Diagnosis not present

## 2022-07-21 DIAGNOSIS — G8929 Other chronic pain: Secondary | ICD-10-CM | POA: Diagnosis not present

## 2022-07-21 DIAGNOSIS — K219 Gastro-esophageal reflux disease without esophagitis: Secondary | ICD-10-CM | POA: Diagnosis not present

## 2022-08-02 ENCOUNTER — Ambulatory Visit: Payer: Medicare Other | Admitting: Cardiology

## 2022-08-17 DIAGNOSIS — I10 Essential (primary) hypertension: Secondary | ICD-10-CM | POA: Diagnosis not present

## 2022-08-17 DIAGNOSIS — E119 Type 2 diabetes mellitus without complications: Secondary | ICD-10-CM | POA: Diagnosis not present

## 2022-08-17 DIAGNOSIS — G8929 Other chronic pain: Secondary | ICD-10-CM | POA: Diagnosis not present

## 2022-08-17 DIAGNOSIS — J438 Other emphysema: Secondary | ICD-10-CM | POA: Diagnosis not present

## 2022-08-17 DIAGNOSIS — E7800 Pure hypercholesterolemia, unspecified: Secondary | ICD-10-CM | POA: Diagnosis not present

## 2022-08-17 DIAGNOSIS — K219 Gastro-esophageal reflux disease without esophagitis: Secondary | ICD-10-CM | POA: Diagnosis not present

## 2022-08-22 NOTE — Progress Notes (Signed)
Patient referred by Joycelyn Rua, MD for coronary artery disease  Subjective:   Brandon Martin, male    DOB: 08/25/1965, 57 y.o.   MRN: [id number]   No chief complaint on file.   HPI  57 y.o. Caucasian male with hypertension, hyperlipidemia, type 2 diabetes mellitus, coronary artery disease s/p CABGX3 (2012), nicotine dependence  *** Patient is doing well, denies chest pain, shortness of breath, palpitations, leg edema, orthopnea, PND, TIA/syncope. Reviewed recent stress test results with the patient, details below. Patient has not started Repatha yet. Unfortunately, he still continues ot smoke 1/2 pack per day, and is not ready to quit yet.  Today he tells me, "I used to drink until the left me alone and then I quit drinking", probably implying that we should leave him alone about his tobacco dependence.   Initial consultation HPI 02/2021: Patient had an episode of unstable angina in 2012, associated with mild chest pain and severe diaphoresis that led to coronary angiography showing severe multivessel disease.  He underwent CABG x3 by Dr. Dorris Fetch in 2012.  He has not seen a cardiologist in several years since then.  Patient is here with his brother today.  Patient is on disability from back surgery since 1999.  He lives at home with his mother, takes care of her.  He works in the yard with mowing, proudly states that he keeps his yard clean.  On exertion, he does not have any specific chest pain or dyspnea symptoms.  1 week ago, he had an episode of chest pain that relieved with nitroglycerin, at rest.  Unfortunately, he continues to smoke half pack a day.  Prior to his CABG, he used to smoke 2 packs a day.  He is not ready to quit yet.  He is compliant with his medical therapy.  Lipids remain uncontrolled.    Current Outpatient Medications:  .  amLODipine (NORVASC) 10 MG tablet, Take 1 tablet (10 mg total) by mouth daily., Disp: 30 tablet, Rfl: 1 .  aspirin 81 MG chewable  tablet, 1 tablet, Disp: , Rfl:  .  clopidogrel (PLAVIX) 75 MG tablet, Take 75 mg by mouth daily., Disp: , Rfl:  .  cyclobenzaprine (FLEXERIL) 5 MG tablet, 1 tablet at bedtime, up to 3 times a day, Disp: , Rfl:  .  gabapentin (NEURONTIN) 300 MG capsule, TAKE TWO CAPSULES BY MOUTH TWICE DAILY, Disp: 180 capsule, Rfl: 3 .  metFORMIN (GLUCOPHAGE) 500 MG tablet, TAKE 2 TABLETS IN THE MORNING WITH A MEAL, 1 TABLET IN THE EVENING WITH A MEAL, Disp: , Rfl:  .  nitroGLYCERIN (NITROSTAT) 0.4 MG SL tablet, PLACE 1 TABLET UNDER TONGUE AS DIRECTED AS NEEDED FOR CHEST PAIN, Disp: , Rfl: 6 .  pantoprazole (PROTONIX) 40 MG tablet, Take 1 tablet by mouth 2 (two) times daily., Disp: , Rfl:  .  REPATHA SURECLICK 140 MG/ML SOAJ, Inject 140 mg into the skin every 14 (fourteen) days., Disp: 6 mL, Rfl: 2  Cardiovascular and other pertinent studies:  EKG 07/31/2021: Sinus rhythm 81 bpm  Right bundle branch block  Lexiscan Tetrofosmin stress test 03/02/2021: Lexiscan nuclear stress test performed using 1-day protocol. Stress EKG is non-diagnostic, as this is pharmacological stress test. In addition, stress EKG at 82% MPHR showed sinus tachcyardia, RBBB, LAFB, anteroseptal T wave inversion related to RBBB. Normal myocardial perfusion. Stress LVEF 52%. Low risk study.   Echocardiogram 03/02/2021:  Normal LV systolic function with visual EF 60-65%. Left ventricle cavity  is normal in  size. Mild basal septal hypertrophy.  Normal global wall  motion. Normal diastolic filling pattern, normal LAP.  No significant valvular heart disease.  Compared to study 2018 no significant change.   EKG 02/11/2021: Sinus rhythm 71 bpm Right bundle branch block  CABGX3 2012 (Dr. Dorris Fetch): LIMA-LAD, left radial artery-PDA, SVG-OM1   Recent labs: 07/21/2021: Chol 112, TG 143, HDL 35, LDL 52  11/04/2020: Glucose 128, BUN/Cr 21/1.12. EGFR 77. Na/K 138/4.4. Rest of the CMP normal H/H 15/47. MCV 88. Platelets 281 HbA1C 6.0% Chol  266, TG 259, HDL 38, LDL 178 TSH N/A    Review of Systems  Cardiovascular:  Negative for chest pain, dyspnea on exertion, leg swelling, palpitations and syncope.  Musculoskeletal:  Positive for back pain.  Neurological:  Positive for paresthesias.         There were no vitals filed for this visit.    There is no height or weight on file to calculate BMI. There were no vitals filed for this visit.    Objective:   Physical Exam Vitals and nursing note reviewed.  Constitutional:      General: He is not in acute distress. Neck:     Vascular: No JVD.  Cardiovascular:     Rate and Rhythm: Normal rate and regular rhythm.     Pulses: Normal pulses.     Heart sounds: Normal heart sounds. No murmur heard. Pulmonary:     Effort: Pulmonary effort is normal.     Breath sounds: Normal breath sounds. No wheezing or rales.  Musculoskeletal:     Right lower leg: No edema.     Left lower leg: No edema.       Assessment & Recommendations:    57 y.o. Caucasian male with hypertension, hyperlipidemia, type 2 diabetes mellitus, coronary artery disease s/p CABGX3 (2012), nicotine dependence  *** CAD: S/p CABGX3 (2012) No recent angina episodes. No ischemic on stress test (02/2021) Continue aspirin 81 mg daily. LDL down to 52 on Repatha and Crestor 20 mg daily.   Reasonable not to be on beta-blocker several years post CABG. Continue amlodipine.  Hypertension: Controlled  Mixed hyperlipidemia: As above  Nicotine dependence: Tobacco cessation counseling:  - Currently smoking 1/2 packs/day   - Patient was informed of the dangers of tobacco abuse including stroke, cancer, and MI, as well as benefits of tobacco cessation. - Patient is NOT willing to quit at this time. - Approximately 5 mins were spent counseling patient cessation techniques. We discussed various methods to help quit smoking, including deciding on a date to quit, joining a support group, pharmacological  agents. - I will reassess his progress at the next follow-up visit   Thank you for referring the patient to [place]. Please feel free to contact with any questions.   Elder Negus, MD Pager: [phone] Office: [phone]

## 2022-08-23 ENCOUNTER — Ambulatory Visit: Payer: Medicare Other | Admitting: Cardiology

## 2022-08-23 ENCOUNTER — Encounter: Payer: Self-pay | Admitting: Cardiology

## 2022-08-23 VITALS — BP 150/93 | HR 97 | Resp 16 | Ht 68.0 in | Wt 199.0 lb

## 2022-08-23 DIAGNOSIS — F17210 Nicotine dependence, cigarettes, uncomplicated: Secondary | ICD-10-CM

## 2022-08-23 DIAGNOSIS — I2510 Atherosclerotic heart disease of native coronary artery without angina pectoris: Secondary | ICD-10-CM | POA: Diagnosis not present

## 2022-08-23 DIAGNOSIS — I10 Essential (primary) hypertension: Secondary | ICD-10-CM | POA: Diagnosis not present

## 2022-08-23 DIAGNOSIS — E782 Mixed hyperlipidemia: Secondary | ICD-10-CM | POA: Diagnosis not present

## 2022-08-23 MED ORDER — METOPROLOL SUCCINATE ER 50 MG PO TB24: 50.0000 mg | ORAL_TABLET | Freq: Every day | ORAL | 3 refills | Status: DC

## 2022-08-23 MED ORDER — REPATHA SURECLICK 140 MG/ML ~~LOC~~ SOAJ: 140.0000 mg | SUBCUTANEOUS | 3 refills | Status: DC

## 2022-08-23 MED ORDER — ASPIRIN 81 MG PO TBEC: 81.0000 mg | DELAYED_RELEASE_TABLET | Freq: Every day | ORAL | 3 refills | Status: DC

## 2023-02-25 DIAGNOSIS — E119 Type 2 diabetes mellitus without complications: Secondary | ICD-10-CM | POA: Diagnosis not present

## 2023-02-25 DIAGNOSIS — I10 Essential (primary) hypertension: Secondary | ICD-10-CM | POA: Diagnosis not present

## 2023-02-25 DIAGNOSIS — Z0000 Encounter for general adult medical examination without abnormal findings: Secondary | ICD-10-CM | POA: Diagnosis not present

## 2023-02-25 DIAGNOSIS — D124 Benign neoplasm of descending colon: Secondary | ICD-10-CM | POA: Diagnosis not present

## 2023-02-25 DIAGNOSIS — R911 Solitary pulmonary nodule: Secondary | ICD-10-CM | POA: Diagnosis not present

## 2023-02-25 DIAGNOSIS — Z7901 Long term (current) use of anticoagulants: Secondary | ICD-10-CM | POA: Diagnosis not present

## 2023-02-25 DIAGNOSIS — J439 Emphysema, unspecified: Secondary | ICD-10-CM | POA: Diagnosis not present

## 2023-02-25 DIAGNOSIS — E782 Mixed hyperlipidemia: Secondary | ICD-10-CM | POA: Diagnosis not present

## 2023-02-28 ENCOUNTER — Other Ambulatory Visit: Payer: Self-pay | Admitting: Family Medicine

## 2023-02-28 ENCOUNTER — Other Ambulatory Visit: Payer: Self-pay

## 2023-02-28 DIAGNOSIS — R911 Solitary pulmonary nodule: Secondary | ICD-10-CM

## 2023-03-02 ENCOUNTER — Encounter: Payer: Self-pay | Admitting: Cardiology

## 2023-03-02 ENCOUNTER — Other Ambulatory Visit: Payer: Self-pay | Admitting: Family Medicine

## 2023-03-02 DIAGNOSIS — E782 Mixed hyperlipidemia: Secondary | ICD-10-CM

## 2023-03-02 DIAGNOSIS — R1032 Left lower quadrant pain: Secondary | ICD-10-CM

## 2023-03-04 ENCOUNTER — Ambulatory Visit: Admission: RE | Admit: 2023-03-04 | Payer: Medicare Other | Source: Ambulatory Visit

## 2023-03-04 ENCOUNTER — Ambulatory Visit
Admission: RE | Admit: 2023-03-04 | Discharge: 2023-03-04 | Disposition: A | Discharge status: Home / Self Care | Payer: Medicare Other | Source: Ambulatory Visit | Attending: Family Medicine | Admitting: Family Medicine

## 2023-03-04 DIAGNOSIS — R1032 Left lower quadrant pain: Secondary | ICD-10-CM

## 2023-03-04 DIAGNOSIS — F17210 Nicotine dependence, cigarettes, uncomplicated: Secondary | ICD-10-CM | POA: Diagnosis not present

## 2023-03-04 DIAGNOSIS — R911 Solitary pulmonary nodule: Secondary | ICD-10-CM

## 2023-03-04 DIAGNOSIS — I700 Atherosclerosis of aorta: Secondary | ICD-10-CM | POA: Diagnosis not present

## 2023-03-04 DIAGNOSIS — J439 Emphysema, unspecified: Secondary | ICD-10-CM | POA: Diagnosis not present

## 2023-03-04 MED ORDER — IOPAMIDOL (ISOVUE-300) INJECTION 61%
100.0000 mL | Freq: Once | INTRAVENOUS | Status: AC | PRN
  Administered 2023-03-04: 100 mL via INTRAVENOUS

## 2023-03-05 NOTE — Addendum Note (Signed)
Addended by: Elder Negus on: 03/05/2023 11:01 AM   Modules accepted: Orders

## 2023-03-08 DIAGNOSIS — R1032 Left lower quadrant pain: Secondary | ICD-10-CM | POA: Diagnosis not present

## 2023-03-08 DIAGNOSIS — D124 Benign neoplasm of descending colon: Secondary | ICD-10-CM | POA: Diagnosis not present

## 2023-04-11 ENCOUNTER — Observation Stay (HOSPITAL_COMMUNITY)
Admission: EM | Admit: 2023-04-11 | Discharge: 2023-04-13 | Disposition: A | Discharge status: Home / Self Care | Payer: Medicare Other | Attending: Internal Medicine | Admitting: Internal Medicine

## 2023-04-11 ENCOUNTER — Emergency Department (HOSPITAL_COMMUNITY): Payer: Medicare Other

## 2023-04-11 ENCOUNTER — Encounter (HOSPITAL_COMMUNITY): Payer: Self-pay | Admitting: Emergency Medicine

## 2023-04-11 ENCOUNTER — Other Ambulatory Visit: Payer: Self-pay

## 2023-04-11 DIAGNOSIS — E782 Mixed hyperlipidemia: Secondary | ICD-10-CM | POA: Diagnosis present

## 2023-04-11 DIAGNOSIS — R1310 Dysphagia, unspecified: Secondary | ICD-10-CM | POA: Diagnosis not present

## 2023-04-11 DIAGNOSIS — I2510 Atherosclerotic heart disease of native coronary artery without angina pectoris: Secondary | ICD-10-CM | POA: Diagnosis present

## 2023-04-11 DIAGNOSIS — R4701 Aphasia: Secondary | ICD-10-CM | POA: Diagnosis not present

## 2023-04-11 DIAGNOSIS — I6381 Other cerebral infarction due to occlusion or stenosis of small artery: Secondary | ICD-10-CM | POA: Diagnosis not present

## 2023-04-11 DIAGNOSIS — K219 Gastro-esophageal reflux disease without esophagitis: Secondary | ICD-10-CM | POA: Diagnosis present

## 2023-04-11 DIAGNOSIS — Z951 Presence of aortocoronary bypass graft: Secondary | ICD-10-CM | POA: Insufficient documentation

## 2023-04-11 DIAGNOSIS — Z7984 Long term (current) use of oral hypoglycemic drugs: Secondary | ICD-10-CM | POA: Diagnosis not present

## 2023-04-11 DIAGNOSIS — I10 Essential (primary) hypertension: Secondary | ICD-10-CM | POA: Diagnosis present

## 2023-04-11 DIAGNOSIS — Z79899 Other long term (current) drug therapy: Secondary | ICD-10-CM | POA: Diagnosis not present

## 2023-04-11 DIAGNOSIS — E119 Type 2 diabetes mellitus without complications: Secondary | ICD-10-CM | POA: Insufficient documentation

## 2023-04-11 DIAGNOSIS — Z8673 Personal history of transient ischemic attack (TIA), and cerebral infarction without residual deficits: Secondary | ICD-10-CM | POA: Diagnosis not present

## 2023-04-11 DIAGNOSIS — Z940 Kidney transplant status: Secondary | ICD-10-CM | POA: Diagnosis not present

## 2023-04-11 DIAGNOSIS — R200 Anesthesia of skin: Secondary | ICD-10-CM | POA: Diagnosis not present

## 2023-04-11 DIAGNOSIS — Z91040 Latex allergy status: Secondary | ICD-10-CM | POA: Insufficient documentation

## 2023-04-11 DIAGNOSIS — Z7902 Long term (current) use of antithrombotics/antiplatelets: Secondary | ICD-10-CM | POA: Insufficient documentation

## 2023-04-11 DIAGNOSIS — M6281 Muscle weakness (generalized): Secondary | ICD-10-CM | POA: Diagnosis not present

## 2023-04-11 DIAGNOSIS — R4781 Slurred speech: Secondary | ICD-10-CM | POA: Diagnosis not present

## 2023-04-11 DIAGNOSIS — R2681 Unsteadiness on feet: Secondary | ICD-10-CM | POA: Insufficient documentation

## 2023-04-11 DIAGNOSIS — F17210 Nicotine dependence, cigarettes, uncomplicated: Secondary | ICD-10-CM | POA: Diagnosis not present

## 2023-04-11 DIAGNOSIS — R471 Dysarthria and anarthria: Secondary | ICD-10-CM | POA: Diagnosis not present

## 2023-04-11 DIAGNOSIS — R2689 Other abnormalities of gait and mobility: Secondary | ICD-10-CM | POA: Diagnosis not present

## 2023-04-11 DIAGNOSIS — Z7982 Long term (current) use of aspirin: Secondary | ICD-10-CM | POA: Diagnosis not present

## 2023-04-11 DIAGNOSIS — F17200 Nicotine dependence, unspecified, uncomplicated: Secondary | ICD-10-CM | POA: Diagnosis present

## 2023-04-11 DIAGNOSIS — Z743 Need for continuous supervision: Secondary | ICD-10-CM | POA: Diagnosis not present

## 2023-04-11 DIAGNOSIS — I639 Cerebral infarction, unspecified: Secondary | ICD-10-CM | POA: Diagnosis not present

## 2023-04-11 DIAGNOSIS — R29818 Other symptoms and signs involving the nervous system: Secondary | ICD-10-CM | POA: Diagnosis not present

## 2023-04-11 DIAGNOSIS — I6782 Cerebral ischemia: Secondary | ICD-10-CM | POA: Diagnosis not present

## 2023-04-11 DIAGNOSIS — R6889 Other general symptoms and signs: Secondary | ICD-10-CM | POA: Diagnosis not present

## 2023-04-11 LAB — CBC
HCT: 45.2 % (ref 39.0–52.0)
Hemoglobin: 15 g/dL (ref 13.0–17.0)
MCH: 29.6 pg (ref 26.0–34.0)
MCHC: 33.2 g/dL (ref 30.0–36.0)
MCV: 89.3 fL (ref 80.0–100.0)
Platelets: 244 10*3/uL (ref 150–400)
RBC: 5.06 MIL/uL (ref 4.22–5.81)
RDW: 12.9 % (ref 11.5–15.5)
WBC: 7.9 10*3/uL (ref 4.0–10.5)
nRBC: 0 % (ref 0.0–0.2)

## 2023-04-11 LAB — DIFFERENTIAL
Abs Immature Granulocytes: 0.02 10*3/uL (ref 0.00–0.07)
Basophils Absolute: 0.1 10*3/uL (ref 0.0–0.1)
Basophils Relative: 1 %
Eosinophils Absolute: 0.2 10*3/uL (ref 0.0–0.5)
Eosinophils Relative: 3 %
Immature Granulocytes: 0 %
Lymphocytes Relative: 29 %
Lymphs Abs: 2.3 10*3/uL (ref 0.7–4.0)
Monocytes Absolute: 0.6 10*3/uL (ref 0.1–1.0)
Monocytes Relative: 8 %
Neutro Abs: 4.7 10*3/uL (ref 1.7–7.7)
Neutrophils Relative %: 59 %

## 2023-04-11 LAB — I-STAT CHEM 8, ED
BUN: 21 mg/dL — ABNORMAL HIGH (ref 6–20)
Calcium, Ion: 1.24 mmol/L (ref 1.15–1.40)
Chloride: 102 mmol/L (ref 98–111)
Creatinine, Ser: 1.2 mg/dL (ref 0.61–1.24)
Glucose, Bld: 138 mg/dL — ABNORMAL HIGH (ref 70–99)
HCT: 46 % (ref 39.0–52.0)
Hemoglobin: 15.6 g/dL (ref 13.0–17.0)
Potassium: 3.9 mmol/L (ref 3.5–5.1)
Sodium: 139 mmol/L (ref 135–145)
TCO2: 24 mmol/L (ref 22–32)

## 2023-04-11 LAB — URINALYSIS, ROUTINE W REFLEX MICROSCOPIC
Bacteria, UA: NONE SEEN
Bilirubin Urine: NEGATIVE
Glucose, UA: NEGATIVE mg/dL
Ketones, ur: NEGATIVE mg/dL
Leukocytes,Ua: NEGATIVE
Nitrite: NEGATIVE
Protein, ur: NEGATIVE mg/dL
Specific Gravity, Urine: 1.016 (ref 1.005–1.030)
pH: 7 (ref 5.0–8.0)

## 2023-04-11 LAB — COMPREHENSIVE METABOLIC PANEL
ALT: 25 U/L (ref 0–44)
AST: 19 U/L (ref 15–41)
Albumin: 3.7 g/dL (ref 3.5–5.0)
Alkaline Phosphatase: 59 U/L (ref 38–126)
Anion gap: 9 (ref 5–15)
BUN: 21 mg/dL — ABNORMAL HIGH (ref 6–20)
CO2: 27 mmol/L (ref 22–32)
Calcium: 9.2 mg/dL (ref 8.9–10.3)
Chloride: 101 mmol/L (ref 98–111)
Creatinine, Ser: 1.18 mg/dL (ref 0.61–1.24)
GFR, Estimated: >60 mL/min (ref >60)
Glucose, Bld: 142 mg/dL — ABNORMAL HIGH (ref 70–99)
Potassium: 3.8 mmol/L (ref 3.5–5.1)
Sodium: 137 mmol/L (ref 135–145)
Total Bilirubin: 0.5 mg/dL (ref 0.3–1.2)
Total Protein: 6.7 g/dL (ref 6.5–8.1)

## 2023-04-11 LAB — PROTIME-INR
INR: 1 (ref 0.8–1.2)
Prothrombin Time: 13.3 s (ref 11.4–15.2)

## 2023-04-11 LAB — RAPID URINE DRUG SCREEN, HOSP PERFORMED
Amphetamines: NOT DETECTED
Barbiturates: NOT DETECTED
Benzodiazepines: NOT DETECTED
Cocaine: NOT DETECTED
Opiates: NOT DETECTED
Tetrahydrocannabinol: POSITIVE — AB

## 2023-04-11 LAB — APTT: aPTT: 25 s (ref 24–36)

## 2023-04-11 LAB — ETHANOL: Alcohol, Ethyl (B): <10 mg/dL (ref <10)

## 2023-04-11 MED ORDER — PANTOPRAZOLE SODIUM 40 MG PO TBEC
40.0000 mg | DELAYED_RELEASE_TABLET | Freq: Two times a day (BID) | ORAL | Status: DC
  Administered 2023-04-11 – 2023-04-13 (×4): 40 mg via ORAL
  Filled 2023-04-11 (×4): qty 1

## 2023-04-11 MED ORDER — CLOPIDOGREL BISULFATE 75 MG PO TABS
75.0000 mg | ORAL_TABLET | Freq: Every morning | ORAL | Status: DC
  Administered 2023-04-12 – 2023-04-13 (×2): 75 mg via ORAL
  Filled 2023-04-11 (×2): qty 1

## 2023-04-11 MED ORDER — ASPIRIN 81 MG PO TBEC
81.0000 mg | DELAYED_RELEASE_TABLET | Freq: Every day | ORAL | Status: DC
  Administered 2023-04-12 – 2023-04-13 (×2): 81 mg via ORAL
  Filled 2023-04-11 (×2): qty 1

## 2023-04-11 MED ORDER — HEPARIN SODIUM (PORCINE) 5000 UNIT/ML IJ SOLN
5000.0000 [IU] | Freq: Three times a day (TID) | INTRAMUSCULAR | Status: DC
  Administered 2023-04-11 – 2023-04-13 (×5): 5000 [IU] via SUBCUTANEOUS
  Filled 2023-04-11 (×5): qty 1

## 2023-04-11 MED ORDER — SENNOSIDES-DOCUSATE SODIUM 8.6-50 MG PO TABS: 1.0000 | ORAL_TABLET | Freq: Every evening | ORAL | Status: DC | PRN

## 2023-04-11 MED ORDER — ASPIRIN 325 MG PO TABS
325.0000 mg | ORAL_TABLET | Freq: Once | ORAL | Status: AC
  Administered 2023-04-11: 325 mg via ORAL
  Filled 2023-04-11: qty 1

## 2023-04-11 MED ORDER — GABAPENTIN 300 MG PO CAPS
600.0000 mg | ORAL_CAPSULE | Freq: Two times a day (BID) | ORAL | Status: DC
  Administered 2023-04-11 – 2023-04-13 (×4): 600 mg via ORAL
  Filled 2023-04-11 (×4): qty 2

## 2023-04-11 MED ORDER — STROKE: EARLY STAGES OF RECOVERY BOOK
Freq: Once | Status: AC
  Filled 2023-04-11: qty 1

## 2023-04-11 MED ORDER — ALPRAZOLAM 0.25 MG PO TABS
0.2500 mg | ORAL_TABLET | Freq: Every day | ORAL | Status: DC
  Administered 2023-04-11 – 2023-04-12 (×2): 0.25 mg via ORAL
  Filled 2023-04-11 (×2): qty 1

## 2023-04-11 MED ORDER — ACETAMINOPHEN 160 MG/5ML PO SOLN: 650.0000 mg | ORAL | Status: DC | PRN

## 2023-04-11 MED ORDER — ONDANSETRON HCL 4 MG/2ML IJ SOLN: 4.0000 mg | Freq: Four times a day (QID) | INTRAMUSCULAR | Status: DC | PRN

## 2023-04-11 MED ORDER — ROSUVASTATIN CALCIUM 20 MG PO TABS
20.0000 mg | ORAL_TABLET | Freq: Every day | ORAL | Status: DC
  Administered 2023-04-12 – 2023-04-13 (×2): 20 mg via ORAL
  Filled 2023-04-11 (×2): qty 1

## 2023-04-11 MED ORDER — SODIUM CHLORIDE 0.9 % IV SOLN: INTRAVENOUS | Status: DC

## 2023-04-11 MED ORDER — ACETAMINOPHEN 325 MG PO TABS
650.0000 mg | ORAL_TABLET | ORAL | Status: DC | PRN
  Administered 2023-04-12 – 2023-04-13 (×3): 650 mg via ORAL
  Filled 2023-04-11 (×3): qty 2

## 2023-04-11 MED ORDER — ACETAMINOPHEN 650 MG RE SUPP: 650.0000 mg | RECTAL | Status: DC | PRN

## 2023-04-11 NOTE — ED Notes (Signed)
[facility] paged @ 2013  Activated CODE STROKE enroute by [facility].

## 2023-04-11 NOTE — Progress Notes (Addendum)
2028 Stroke cart activated and elert sent to [facility] for prearrival of pt via EMS with stroke-like symptoms. 2029 pt arrives via stretcher with EMS. EDP Deretha Emory, MD) at bedside assessing pt. Pt presents with c/o slurred speech and aphasia with LKW 1945. 2031 Pt to CT 2038 [facility] paged 2040 Pt return from CT 2042 [facility] Bufford Buttner, MD) connected via stroke cart. SBAR given by [facility]. [facility] assessing pt at this time.

## 2023-04-11 NOTE — ED Notes (Signed)
Pharmacy at bedside

## 2023-04-11 NOTE — ED Notes (Signed)
Ct at 2033, back to ED room 2 at 2040

## 2023-04-11 NOTE — ED Notes (Signed)
Teleneuro Dr at 2043

## 2023-04-11 NOTE — ED Provider Notes (Addendum)
[place] EMERGENCY DEPARTMENT AT [facility] Provider Note   CSN: [id number] Arrival date & time: 04/11/23  2030     History  Chief Complaint  Patient presents with   Code Stroke    Brandon Martin is a 57 y.o. male.  Patient with speech problems and aphasia problems that started at 19.  Patient with a previous old stroke resulting in left arm weakness.  Patient brought in by EMS.  Patient has history of prior stroke with residual left-sided deficits hypertension hyperlipidemia diabetes.  Patient is stated had slurred speech and some left-sided facial numbness some dysarthria.   Patient seen 03/06/2017 for stroke and was hospitalized at that time.  Patient also known to have coronary artery disease.  Neurology gave patient an NIHSS score of 3.       Home Medications Prior to Admission medications   Medication Sig Start Date End Date Taking? Authorizing Provider  ALPRAZolam (XANAX) 0.25 MG tablet Take 0.25 mg by mouth at bedtime. 07/26/22  Yes [provider]  amLODipine (NORVASC) 10 MG tablet Take 1 tablet (10 mg total) by mouth daily. 12/16/16  Yes Angiulli, Mcarthur Rossetti, PA-C  aspirin EC 81 MG tablet Take 1 tablet (81 mg total) by mouth daily. Swallow whole. 08/23/22  Yes Patwardhan, Manish J, MD  clopidogrel (PLAVIX) 75 MG tablet Take 75 mg by mouth every morning.   Yes [provider]  gabapentin (NEURONTIN) 300 MG capsule TAKE TWO CAPSULES BY MOUTH TWICE DAILY 01/14/22  Yes Hyatt, Max T, DPM  metFORMIN (GLUCOPHAGE) 500 MG tablet TAKE 2 TABLETS IN THE MORNING WITH A MEAL, 1 TABLET IN THE EVENING WITH A MEAL   Yes [provider]  metoprolol succinate (TOPROL-XL) 50 MG 24 hr tablet Take 1 tablet (50 mg total) by mouth daily. Take with or immediately following a meal. 08/23/22 08/18/23 Yes Patwardhan, Manish J, MD  nitroGLYCERIN (NITROSTAT) 0.4 MG SL tablet PLACE 1 TABLET UNDER TONGUE AS DIRECTED AS NEEDED FOR CHEST PAIN 01/07/17  Yes [provider]  pantoprazole (PROTONIX) 40 MG tablet Take 1 tablet by mouth 2 (two) times daily. 08/08/20  Yes [provider]  rosuvastatin (CRESTOR) 20 MG tablet Take 20 mg by mouth daily.   Yes [provider]      Allergies    Aspirin, Bee pollen, Morphine, Morphine and codeine, and Latex    Review of Systems   Review of Systems  Constitutional:  Negative for chills and fever.  HENT:  Negative for ear pain and sore throat.   Eyes:  Negative for pain and visual disturbance.  Respiratory:  Negative for cough and shortness of breath.   Cardiovascular:  Negative for chest pain and palpitations.  Gastrointestinal:  Negative for abdominal pain and vomiting.  Genitourinary:  Negative for dysuria and hematuria.  Musculoskeletal:  Negative for arthralgias and back pain.  Skin:  Negative for color change and rash.  Neurological:  Positive for speech difficulty, weakness and numbness. Negative for seizures and syncope.  All other systems reviewed and are negative.   Physical Exam Updated Vital Signs BP (!) 172/98   Pulse 66   Temp 97.9 F (36.6 C) (Oral)   Resp 17   Wt 85.8 kg   SpO2 95%   BMI 28.75 kg/m  Physical Exam Vitals and nursing note reviewed.  Constitutional:      General: He is not in acute distress.    Appearance: Normal appearance. He is well-developed.  HENT:  Head: Normocephalic and atraumatic.  Eyes:     Extraocular Movements: Extraocular movements intact.     Conjunctiva/sclera: Conjunctivae normal.     Pupils: Pupils are equal, round, and reactive to light.  Cardiovascular:     Rate and Rhythm: Normal rate and regular rhythm.     Heart sounds: No murmur heard. Pulmonary:     Effort: Pulmonary effort is normal. No respiratory distress.     Breath sounds: Normal breath sounds.  Abdominal:     Palpations: Abdomen is soft.     Tenderness: There is no abdominal tenderness.  Musculoskeletal:        General: No swelling.     Cervical  back: Normal range of motion and neck supple.  Skin:    General: Skin is warm and dry.     Capillary Refill: Capillary refill takes less than 2 seconds.  Neurological:     Mental Status: He is alert.     Cranial Nerves: Cranial nerve deficit present.     Sensory: Sensory deficit present.     Motor: Weakness present.     Comments: Patient seem to have some left facial numbness.  Has residual probably left upper extremity weakness.  Questionable slurred speech questionable dysarthria.  Psychiatric:        Mood and Affect: Mood normal.     ED Results / Procedures / Treatments   Labs (all labs ordered are listed, but only abnormal results are displayed) Labs Reviewed  COMPREHENSIVE METABOLIC PANEL - Abnormal; Notable for the following components:      Result Value   Glucose, Bld 142 (*)    BUN 21 (*)    All other components within normal limits  I-STAT CHEM 8, ED - Abnormal; Notable for the following components:   BUN 21 (*)    Glucose, Bld 138 (*)    All other components within normal limits  ETHANOL  PROTIME-INR  APTT  CBC  DIFFERENTIAL  RAPID URINE DRUG SCREEN, HOSP PERFORMED  URINALYSIS, ROUTINE W REFLEX MICROSCOPIC    EKG EKG Interpretation Date/Time:  Monday April 11 2023 20:52:45 EDT Ventricular Rate:  64 PR Interval:  158 QRS Duration:  140 QT Interval:  412 QTC Calculation: 426 R Axis:   84  Text Interpretation: Sinus rhythm Right bundle branch block No significant change since last tracing Confirmed by Vanetta Mulders [phone]) on 04/11/2023 8:57:12 PM  Radiology CT HEAD CODE STROKE WO CONTRAST  Result Date: 04/11/2023 CLINICAL DATA:  Code stroke.  Dysphagia EXAM: CT HEAD WITHOUT CONTRAST TECHNIQUE: Contiguous axial images were obtained from the base of the skull through the vertex without intravenous contrast. RADIATION DOSE REDUCTION: This exam was performed according to the departmental dose-optimization program which includes automated exposure control,  adjustment of the mA and/or kV according to patient size and/or use of iterative reconstruction technique. COMPARISON:  12/04/2016 FINDINGS: Brain: There is no mass, hemorrhage or extra-axial collection. The size and configuration of the ventricles and extra-axial CSF spaces are normal. Basal ganglia small vessel infarcts. Vascular: No abnormal hyperdensity of the major intracranial arteries or dural venous sinuses. No intracranial atherosclerosis. Skull: The visualized skull base, calvarium and extracranial soft tissues are normal. Sinuses/Orbits: No fluid levels or advanced mucosal thickening of the visualized paranasal sinuses. No mastoid or middle ear effusion. The orbits are normal. ASPECTS [facility] Stroke Program Early CT Score) - Ganglionic level infarction (caudate, lentiform nuclei, internal capsule, insula, M1-M3 cortex): 7 - Supraganglionic infarction (M4-M6 cortex): 3 Total score (0-10 with 10  being normal): 10 IMPRESSION: 1. No acute intracranial abnormality. 2. ASPECTS is 10. These results were called by telephone at the time of interpretation on 04/11/2023 at 8:41 pm to provider Vanetta Mulders , who verbally acknowledged these results. Electronically Signed   By: Deatra Robinson M.D.   On: 04/11/2023 20:41    Procedures Procedures    Medications Ordered in ED Medications  aspirin tablet 325 mg (has no administration in time range)    ED Course/ Medical Decision Making/ A&P                                 Medical Decision Making Amount and/or Complexity of Data Reviewed Labs: ordered. Radiology: ordered.  Risk OTC drugs. Decision regarding hospitalization.   CRITICAL CARE Performed by: Vanetta Mulders Total critical care time: 45 minutes Critical care time was exclusive of separately billable procedures and treating other patients. Critical care was necessary to treat or prevent imminent or life-threatening deterioration. Critical care was time spent personally by me on the  following activities: development of treatment plan with patient and/or surrogate as well as nursing, discussions with consultants, evaluation of patient's response to treatment, examination of patient, obtaining history from patient or surrogate, ordering and performing treatments and interventions, ordering and review of laboratory studies, ordering and review of radiographic studies, pulse oximetry and re-evaluation of patient's condition.  Code stroke activated upon arrival last known to be normal was 1945.  Patient seen by teleneurology specialist.  Head CT without acute findings.  Patient is alcohol less than 10 INR good CBC white count 7.9 hemoglobin 15 platelets 244.  Complete metabolic panel glucose 142 LFTs normal renal function test GFR greater than 60.  Head CT with no acute intercranial abnormality.  Neurology said patient not a candidate for TNK.  But did recommend giving him an aspirin.  Getting MRI.  Permissive hypertension of control to keep it below 220/120 for the first 24 hours.  MRI has been ordered patient given the aspirin.  Discussed with hospitalist who will admit.  Patient knows he is going to be admitted.   Final Clinical Impression(s) / ED Diagnoses Final diagnoses:  Aphasia  Cerebrovascular accident (CVA), unspecified mechanism [facility])    Rx / DC Orders ED Discharge Orders     None         Vanetta Mulders, MD 04/11/23 2034    Vanetta Mulders, MD 04/11/23 2139

## 2023-04-11 NOTE — Consult Note (Signed)
[facility] Tele[facility]urology Consult Services   Patient Name:   Brandon Martin, Brandon Martin Date of Birth:   1966-05-07 Identification Number:   MRN - [id number] Date of Service:   04/11/2023 20:38:40  Diagnosis:       R47.81 - Slurred speech  Impression:      57 yo M w/ PMHx of prior stroke with residual L sided deficits, HTN, HLD, DM, who presents with slurred speech. NIHSS 3 for L facial numbness, dysarthria, incorrect month. He is not a candidate for thrombolytic due to minor non-disabling symptoms. This was discussed with the patient who expressed understanding.    Suspect presentation could be due to acute stroke vs toxic metabolic encephalopathy leading to dysarthria. Recommend admission for stroke rule out as below. Please call with any questions.  Our recommendations are outlined below.  Recommendations:        Stroke/Telemetry Floor       Neuro Checks       Bedside Swallow Eval       DVT Prophylaxis       IV Fluids, Normal Saline       Head of Bed 30 Degrees       Euglycemia and Avoid Hyperthermia (PRN Acetaminophen)       ASA 325mg  load f/b 81mg  daily       statin for LDL goal < 70       further antiplatelet decisions can be based off of MRI       MRI Brain w/o contrast       Carotid ultrasound vs routine CTA H+N during admission       Permissive HTN < 220/120 for the first 24 hours  Sign Out:       Discussed with Emergency Department Provider    ------------------------------------------------------------------------------  Advanced Imaging: Advanced Imaging Deferred because:  no cortical signs to suggest LVO   Metrics: Last Known Well: 04/11/2023 19:45:00 [facility] Notification Time: 04/11/2023 20:38:40 Arrival Time: 04/11/2023 20:30:00 Stamp Time: 04/11/2023 20:38:40 Initial Response Time: 04/11/2023 20:41:26 Symptoms: slurred speech. Initial patient interaction: 04/11/2023 20:43:49 NIHSS Assessment Completed: 04/11/2023 20:50:55 Patient is  not a candidate for Thrombolytic. Thrombolytic Medical Decision: 04/11/2023 20:50:56 Patient was not deemed candidate for Thrombolytic because of following reasons: Stroke severity too mild (non-disabling) .  I personally Reviewed the CT Head and it Showed no acute hemorrhage or acute core infarct. There are chronic appearing R and L basal ganglia infarcts  Primary Provider Notified of Diagnostic Impression and Management Plan on: 04/11/2023 20:56:28    ------------------------------------------------------------------------------  History of Present Illness: Patient is a 57 year old Male.  Patient was brought by EMS for symptoms of slurred speech. Patient presents with slurred speech and difficulty speaking. He was LKW at [employer].  He states he has baseline L sided deficits including weakness and numbness from a prior stroke 5 years ago. Denies any worsening today.   He has been off of all his medications for the past week and a half. He used to take plavix and aspirin.    Past Medical History:      Hypertension      Diabetes Mellitus      Hyperlipidemia      Stroke Other PMH:  prior MI  Medications:  No Anticoagulant use  No Antiplatelet use Reviewed EMR for current medications  Allergies:  Reviewed  Social History: Smoking: Yes  Family History:  There is no family history of premature cerebrovascular disease pertinent to this consultation  ROS : 14 Points Review  of Systems was performed and was negative except mentioned in HPI.  Past Surgical History: There Is No Surgical History Contributory To Today's Visit     Examination: BP(172/98), Pulse(67), 1A: Level of Consciousness - Alert; keenly responsive + 0 1B: Ask Month and Age - 1 Question Right + 1 1C: Blink Eyes & Squeeze Hands - Performs Both Tasks + 0 2: Test Horizontal Extraocular Movements - Normal + 0 3: Test Visual Fields - No Visual Loss + 0 4: Test Facial Palsy (Use Grimace if Obtunded)  - Normal symmetry + 0 5A: Test Left Arm Motor Drift - No Drift for 10 Seconds + 0 5B: Test Right Arm Motor Drift - No Drift for 10 Seconds + 0 6A: Test Left Leg Motor Drift - No Drift for 5 Seconds + 0 6B: Test Right Leg Motor Drift - No Drift for 5 Seconds + 0 7: Test Limb Ataxia (FNF/Heel-Shin) - No Ataxia + 0 8: Test Sensation - Mild-Moderate Loss: Less Sharp/More Dull + 1 9: Test Language/Aphasia - Normal; No aphasia + 0 10: Test Dysarthria - Mild-Moderate Dysarthria: Slurring but can be understood + 1 11: Test Extinction/Inattention - No abnormality + 0  NIHSS Score: 3  NIHSS Free Text : left face decreased sensation  Pre-Morbid Modified Rankin Scale: 2 Points = Slight disability; unable to carry out all previous activities, but able to look after own affairs without assistance  Spoke with : Dr. Deretha Emory  This consult was conducted in real time using interactive audio and Immunologist. Patient was informed of the technology being used for this visit and agreed to proceed. Patient located in hospital and provider located at home/office setting.   Patient is being evaluated for possible acute neurologic impairment and high probability of imminent or life-threatening deterioration. I spent total of 30 minutes providing care to this patient, including time for face to face visit via telemedicine, review of medical records, imaging studies and discussion of findings with providers, the patient and/or family.   Dr Hughie Closs   [facility] For Inpatient follow-up with [facility] physician please call [facility] [phone]. This is not an outpatient service. Post hospital discharge, please contact hospital directly.  Please do not communicate with [facility] physicians via secure chat. If you have any questions, Please contact [facility]. Please call or reconsult our service if there are any clinical or diagnostic changes.

## 2023-04-11 NOTE — ED Notes (Signed)
Patient transported to MRI 

## 2023-04-11 NOTE — ED Triage Notes (Signed)
Pt arrived in ED at 2029. Edp and teleneuro waiting at door. Pt arrived alert, color wnl. Cleared for ct at 2031. Pt taken to ct then. Pt LKW 1945, started having slurred speech. Has been off plavix x 2 weeks.  Cbg at door in ed 136.

## 2023-04-12 ENCOUNTER — Observation Stay (HOSPITAL_BASED_OUTPATIENT_CLINIC_OR_DEPARTMENT_OTHER): Payer: Medicare Other

## 2023-04-12 DIAGNOSIS — R2990 Unspecified symptoms and signs involving the nervous system: Secondary | ICD-10-CM

## 2023-04-12 DIAGNOSIS — I6389 Other cerebral infarction: Secondary | ICD-10-CM

## 2023-04-12 DIAGNOSIS — I639 Cerebral infarction, unspecified: Secondary | ICD-10-CM

## 2023-04-12 DIAGNOSIS — I2510 Atherosclerotic heart disease of native coronary artery without angina pectoris: Secondary | ICD-10-CM | POA: Diagnosis not present

## 2023-04-12 DIAGNOSIS — F17200 Nicotine dependence, unspecified, uncomplicated: Secondary | ICD-10-CM | POA: Diagnosis not present

## 2023-04-12 DIAGNOSIS — K219 Gastro-esophageal reflux disease without esophagitis: Secondary | ICD-10-CM | POA: Diagnosis not present

## 2023-04-12 DIAGNOSIS — I10 Essential (primary) hypertension: Secondary | ICD-10-CM | POA: Diagnosis not present

## 2023-04-12 DIAGNOSIS — E782 Mixed hyperlipidemia: Secondary | ICD-10-CM | POA: Diagnosis not present

## 2023-04-12 DIAGNOSIS — R4701 Aphasia: Secondary | ICD-10-CM

## 2023-04-12 LAB — LIPID PANEL
Cholesterol: 210 mg/dL — ABNORMAL HIGH (ref 0–200)
HDL: 33 mg/dL — ABNORMAL LOW (ref >40)
LDL Cholesterol: 126 mg/dL — ABNORMAL HIGH (ref 0–99)
Total CHOL/HDL Ratio: 6.4 {ratio}
Triglycerides: 254 mg/dL — ABNORMAL HIGH (ref <150)
VLDL: 51 mg/dL — ABNORMAL HIGH (ref 0–40)

## 2023-04-12 LAB — ECHOCARDIOGRAM COMPLETE
Area-P 1/2: 3.85 cm2
Calc EF: 56.5 %
S' Lateral: 3.8 cm
Single Plane A2C EF: 54.9 %
Single Plane A4C EF: 59 %
Weight: 3025.6 [oz_av]

## 2023-04-12 LAB — COMPREHENSIVE METABOLIC PANEL
ALT: 25 U/L (ref 0–44)
AST: 18 U/L (ref 15–41)
Albumin: 3.7 g/dL (ref 3.5–5.0)
Alkaline Phosphatase: 60 U/L (ref 38–126)
Anion gap: 8 (ref 5–15)
BUN: 18 mg/dL (ref 6–20)
CO2: 26 mmol/L (ref 22–32)
Calcium: 9 mg/dL (ref 8.9–10.3)
Chloride: 101 mmol/L (ref 98–111)
Creatinine, Ser: 1.07 mg/dL (ref 0.61–1.24)
GFR, Estimated: >60 mL/min (ref >60)
Glucose, Bld: 121 mg/dL — ABNORMAL HIGH (ref 70–99)
Potassium: 3.7 mmol/L (ref 3.5–5.1)
Sodium: 135 mmol/L (ref 135–145)
Total Bilirubin: 0.7 mg/dL (ref 0.3–1.2)
Total Protein: 6.7 g/dL (ref 6.5–8.1)

## 2023-04-12 LAB — BASIC METABOLIC PANEL
Anion gap: 9 (ref 5–15)
BUN: 15 mg/dL (ref 6–20)
CO2: 22 mmol/L (ref 22–32)
Calcium: 8.8 mg/dL — ABNORMAL LOW (ref 8.9–10.3)
Chloride: 100 mmol/L (ref 98–111)
Creatinine, Ser: 0.98 mg/dL (ref 0.61–1.24)
GFR, Estimated: >60 mL/min (ref >60)
Glucose, Bld: 126 mg/dL — ABNORMAL HIGH (ref 70–99)
Potassium: 3.8 mmol/L (ref 3.5–5.1)
Sodium: 131 mmol/L — ABNORMAL LOW (ref 135–145)

## 2023-04-12 LAB — HEMOGLOBIN A1C
Hgb A1c MFr Bld: 5.8 % — ABNORMAL HIGH (ref 4.8–5.6)
Mean Plasma Glucose: 119.76 mg/dL

## 2023-04-12 LAB — CBC
HCT: 45.9 % (ref 39.0–52.0)
Hemoglobin: 15.2 g/dL (ref 13.0–17.0)
MCH: 29.5 pg (ref 26.0–34.0)
MCHC: 33.1 g/dL (ref 30.0–36.0)
MCV: 89.1 fL (ref 80.0–100.0)
Platelets: 240 10*3/uL (ref 150–400)
RBC: 5.15 MIL/uL (ref 4.22–5.81)
RDW: 12.6 % (ref 11.5–15.5)
WBC: 9.4 10*3/uL (ref 4.0–10.5)
nRBC: 0 % (ref 0.0–0.2)

## 2023-04-12 LAB — GLUCOSE, CAPILLARY: Glucose-Capillary: 128 mg/dL — ABNORMAL HIGH (ref 70–99)

## 2023-04-12 LAB — HIV ANTIBODY (ROUTINE TESTING W REFLEX): HIV Screen 4th Generation wRfx: NONREACTIVE

## 2023-04-12 MED ORDER — PERFLUTREN LIPID MICROSPHERE
1.0000 mL | INTRAVENOUS | Status: AC | PRN
  Administered 2023-04-12: 2 mL via INTRAVENOUS

## 2023-04-12 MED ORDER — SUMATRIPTAN SUCCINATE 6 MG/0.5ML ~~LOC~~ SOLN
6.0000 mg | Freq: Once | SUBCUTANEOUS | Status: AC
  Administered 2023-04-12: 6 mg via SUBCUTANEOUS
  Filled 2023-04-12: qty 0.5

## 2023-04-12 NOTE — ED Notes (Signed)
Danielle from poison control called this RN back to give more information from their provider. Advised to get an EKG, BMP, and monitor at least 4 hours. Dr. Sheliah Plane updated to information

## 2023-04-12 NOTE — H&P (Signed)
History and Physical    Patient: Brandon Martin PPI:[id number] DOB: 10/29/1965 DOA: 04/11/2023 DOS: the patient was seen and examined on 04/12/2023 PCP: Joycelyn Rua, MD  Patient coming from: Home  Chief Complaint:  Chief Complaint  Patient presents with   Code Stroke   HPI: Brandon Martin is a 57 y.o. male with medical history significant of coronary artery disease, GERD, hyperlipidemia, hypertension, tobacco use disorder, previous stroke with left-sided deficits, presents to the ED with a chief complaint of confusion and slurred speech.  Patient reported earlier that his last known well was 19:45, but he reports to me that his last known well was 17:45.  He reports he had fallen outside earlier.  It was a mechanical fall.  He stepped backwards his foot hit a block and he fell onto his right side.  He did not hit his head.  He reports he had some whiplash in his neck.  He went into cook dinner, and then he realized he was confused when someone asked him what he wanted to watch on TV and he could not remember anything per his report.  He reportedly had slurred speech at that time as well.  Patient reports up until then he had been in his normal state of health.  At the time of admission he has a headache, but he reports the headache started after the dysarthria went away.  His headache is located behind his left eye.  It is not affecting his vision.  It is keeping him from sleeping.  As of yet, we have not tried anything to abort the headache, but he is going to try some Tylenol now.  It is worth noting the patient has had trouble with access to medication.  He has changed pharmacies and the new pharmacy supposed to be sending him meds but has not yet.  He has not take any of his chronic medications for 10 days.  He reports he is also been stressed at home.  His mother is in poor health and he has been worried about her.  Patient has no other complaints at this time.  Patient does smoke half a pack per  day.  He does not drink alcohol.  He is full code. Review of Systems: As mentioned in the history of present illness. All other systems reviewed and are negative. Past Medical History:  Diagnosis Date   Coronary artery disease    GERD (gastroesophageal reflux disease)    Heart attack (HCC)    Hyperlipidemia    Hypertension    Pleural effusion    Stroke (HCC)    Tobacco use disorder    Past Surgical History:  Procedure Laterality Date   BACK SURGERY     CORONARY ARTERY BYPASS GRAFT  x 3   KIDNEY TRANSPLANT Left    KNEE SURGERY Left    6   Social History:  reports that he has been smoking cigarettes. He has a 15 pack-year smoking history. He has quit using smokeless tobacco. He reports current alcohol use. He reports that he does not use drugs.  Allergies  Allergen Reactions   Aspirin Other (See Comments)    HX of gastric ulcers Can't tolerate large doses   Bee Pollen Other (See Comments)    Seasonal allergies   Morphine Other (See Comments)    unknown   Morphine And Codeine Nausea And Vomiting   Latex Rash    Family History  Problem Relation Age of Onset   Hypertension Mother  Arthritis Mother    Atrial fibrillation Mother    Cancer Father     Prior to Admission medications   Medication Sig Start Date End Date Taking? Authorizing Provider  ALPRAZolam (XANAX) 0.25 MG tablet Take 0.25 mg by mouth at bedtime. 07/26/22  Yes [provider]  amLODipine (NORVASC) 10 MG tablet Take 1 tablet (10 mg total) by mouth daily. 12/16/16  Yes Angiulli, Mcarthur Rossetti, PA-C  aspirin EC 81 MG tablet Take 1 tablet (81 mg total) by mouth daily. Swallow whole. 08/23/22  Yes Patwardhan, Manish J, MD  clopidogrel (PLAVIX) 75 MG tablet Take 75 mg by mouth every morning.   Yes [provider]  gabapentin (NEURONTIN) 300 MG capsule TAKE TWO CAPSULES BY MOUTH TWICE DAILY 01/14/22  Yes Hyatt, Max T, DPM  metFORMIN (GLUCOPHAGE) 500 MG tablet TAKE 2 TABLETS IN THE MORNING WITH A MEAL, 1  TABLET IN THE EVENING WITH A MEAL   Yes [provider]  metoprolol succinate (TOPROL-XL) 50 MG 24 hr tablet Take 1 tablet (50 mg total) by mouth daily. Take with or immediately following a meal. 08/23/22 08/18/23 Yes Patwardhan, Manish J, MD  nitroGLYCERIN (NITROSTAT) 0.4 MG SL tablet PLACE 1 TABLET UNDER TONGUE AS DIRECTED AS NEEDED FOR CHEST PAIN 01/07/17  Yes [provider]  pantoprazole (PROTONIX) 40 MG tablet Take 1 tablet by mouth 2 (two) times daily. 08/08/20  Yes [provider]  rosuvastatin (CRESTOR) 20 MG tablet Take 20 mg by mouth daily.   Yes [provider]    Physical Exam: Vitals:   04/11/23 2330 04/12/23 0000 04/12/23 0030 04/12/23 0100  BP: 139/69 (!) 152/108 (!) 155/78 (!) 155/83  Pulse: 65 (!) 59 67 66  Resp: 17 17 16 20   Temp:      TempSrc:      SpO2: 92% 94% 96%   Weight:       1.  General: Patient lying supine in bed,  no acute distress   2. Psychiatric: Alert and oriented x 3, mood and behavior normal for situation, pleasant and cooperative with exam   3. Neurologic: Speech and language are normal, face is symmetric, chronic left upper extremity weakness, at baseline without acute deficits on limited exam   4. HEENMT:  Head is atraumatic, normocephalic, pupils reactive to light, neck is supple, trachea is midline, mucous membranes are moist   5. Respiratory : Lungs are clear to auscultation bilaterally without wheezing, rhonchi, rales, no cyanosis, no increase in work of breathing or accessory muscle use   6. Cardiovascular : Heart rate normal, rhythm is regular, no murmurs, rubs or gallops, no peripheral edema, peripheral pulses palpated   7. Gastrointestinal:  Abdomen is soft, nondistended, nontender to palpation bowel sounds active, no masses or organomegaly palpated   8. Skin:  Skin is warm, dry and intact without rashes, acute lesions, or ulcers on limited exam   9.Musculoskeletal:  No acute deformities or  trauma, no asymmetry in tone, no peripheral edema, peripheral pulses palpated, no tenderness to palpation in the extremities  Data Reviewed: T 97.9, HR 66, R 14 - 17, BP 172/98 95 - 98% WBC 7.9, Hgb 15 EtOH undetectable Chem is unremarkable CT code stroke is without acute abnormality EKG = HR 64, SR, Qtc 426 UA is not indicative of UTI UDS - negative Neuro consulted. Initial NIH was 3 for dysarthria, (chronic) left upper extremity weakness, and decreased sensation of face. NIH now 0 Admission requested for TIA/CVA workup  Assessment and Plan: *  CVA (cerebral vascular accident) (HCC) -New dysarthria  -Chronic left sided weakness -Neuro rec's admission for CVA/TIA workup -Echo in the AM -MRI = no acute abnormalities, previously known right basal ganglia stroke -PT/OT/ST eval and treat -ASA 325mg  given in ED -Continue aspirin and plavix -Continue statin -Permissive HTN -Continue to monitor  Essential hypertension - Holding norvasc and metoprolol for permissive HTN  Tobacco use disorder -Working on cessation -half a pack per day -Declines nicotine patch at this time  Mixed hyperlipidemia -Continue statin  GERD (gastroesophageal reflux disease) -Continue PPI  Coronary artery disease -Continue aspirin, plavix, statin -Hold metoprolol in the setting of permissive HTN -Continue to monitor      Advance Care Planning:   Code Status: Full Code  Consults: Neurology  Family Communication: No family at bedside  Severity of Illness: The appropriate patient status for this patient is OBSERVATION. Observation status is judged to be reasonable and necessary in order to provide the required intensity of service to ensure the patient's safety. The patient's presenting symptoms, physical exam findings, and initial radiographic and laboratory data in the context of their medical condition is felt to place them at decreased risk for further clinical deterioration. Furthermore, it is  anticipated that the patient will be medically stable for discharge from the hospital within 2 midnights of admission.   Author: Lilyan Gilford, DO 04/12/2023 1:58 AM  For on call review [email or web address].

## 2023-04-12 NOTE — Assessment & Plan Note (Signed)
Continue statin. 

## 2023-04-12 NOTE — ED Notes (Addendum)
Contacted provider regarding imitrex, edp advised to call poison control. This RN contacted pharmacy first for advisement. Pt has no complaints at this time, pt on all monitoring equipment. Will continue to monitor pt

## 2023-04-12 NOTE — Evaluation (Signed)
Speech Language Pathology Evaluation Patient Details Name: EITAN SCHOENWETTER MRN: [id number] DOB: Jul 20, 1965 Today's Date: 04/12/2023 Time: 1130-1150 SLP Time Calculation (min) (ACUTE ONLY): 20 min  Problem List:  Patient Active Problem List   Diagnosis Date Noted   CVA (cerebral vascular accident) (HCC) 04/11/2023   Nicotine dependence, cigarettes, uncomplicated 02/11/2021   Essential hypertension 02/11/2021   Subacromial impingement of left shoulder 01/21/2017   Right basal ganglia embolic stroke (HCC) 12/07/2016   Basal ganglia infarction (HCC) 12/04/2016   Tobacco use disorder    Coronary artery disease    GERD (gastroesophageal reflux disease)    Mixed hyperlipidemia    Pleural effusion    Past Medical History:  Past Medical History:  Diagnosis Date   Coronary artery disease    GERD (gastroesophageal reflux disease)    Heart attack (HCC)    Hyperlipidemia    Hypertension    Pleural effusion    Stroke (HCC)    Tobacco use disorder    Past Surgical History:  Past Surgical History:  Procedure Laterality Date   BACK SURGERY     CORONARY ARTERY BYPASS GRAFT  x 3   KIDNEY TRANSPLANT Left    KNEE SURGERY Left    6   HPI:  57 y.o. male with medical history significant of coronary artery disease, GERD, hyperlipidemia, hypertension, tobacco use disorder, previous stroke with left-sided deficits, presents to the ED with a chief complaint of confusion and slurred speech.  MRI on 04/12/23 indicated No acute intracranial abnormality.  2. Findings of chronic microvascular ischemia and old right basal  ganglia small vessel infarct.  ST consulted for speech/language evaluation.   Assessment / Plan / Recommendation Clinical Impression  Pt seen for speech/language assessment with pt functioning at baseline level as initial reported symptoms have resolved per brother/pt report.  Pt initially experiencing aphasic components to speech where he noted "I couldn't get the word out", but pt able  to converse freely without aphasia present.  Naming adequate for simple tasks, but complex tasks eliminated as pt expressed he had a 9th grade education.  Brother informed SLP pt "was unable to read" even prior to CVA.  Pt completed simple problem solving/awareness tasks without apparent difficulty and was able to focus on simple tasks without errors observed.  Pain level in L eye may have impacted certain tasks as pt was grimacing stating 8/10 for pain rating.  L extremities decreased in strength per report, but OME unremarkable.  Pt denies dysphagia.  Speech intelligible (100%) within simple conversation.  No ST f/u recommended at this time and ST will s/o in acute setting.  Thank you for this consult.    SLP Assessment  SLP Recommendation/Assessment: Patient does not need any further Speech Language Pathology Services SLP Visit Diagnosis: Cognitive communication deficit (R41.841)    Recommendations for follow up therapy are one component of a multi-disciplinary discharge planning process, led by the attending physician.  Recommendations may be updated based on patient status, additional functional criteria and insurance authorization.    Follow Up Recommendations  No SLP follow up    Assistance Recommended at Discharge  PRN  Functional Status Assessment Patient has had a recent decline in their functional status and demonstrates the ability to make significant improvements in function in a reasonable and predictable amount of time.  Frequency and Duration  (evaluation only)         SLP Evaluation Cognition  Overall Cognitive Status: Within Functional Limits for tasks assessed Orientation Level: Oriented X4  Awareness: Appears intact Problem Solving: Appears intact Safety/Judgment: Appears intact       Comprehension  Auditory Comprehension Overall Auditory Comprehension: Appears within functional limits for tasks assessed Conversation: Simple Visual  Recognition/Discrimination Discrimination: Not tested Reading Comprehension Reading Status: Unable to assess (comment) (pt illiterate per report)    Expression Expression Primary Mode of Expression: Verbal Verbal Expression Overall Verbal Expression: Appears within functional limits for tasks assessed Initiation: No impairment Level of Generative/Spontaneous Verbalization: Conversation Naming: No impairment Pragmatics: No impairment Interfering Components: Other (comment) (pain) Non-Verbal Means of Communication: Not applicable Written Expression Dominant Hand: Right Written Expression: Not tested   Oral / Motor  Oral Motor/Sensory Function Overall Oral Motor/Sensory Function: Within functional limits Motor Speech Overall Motor Speech: Appears within functional limits for tasks assessed Respiration: Within functional limits Phonation: Normal Resonance: Within functional limits Articulation: Within functional limitis Intelligibility: Intelligible Motor Planning: Witnin functional limits Motor Speech Errors: Not applicable            Pat Tanesia Butner,M.S.,CCC-SLP 04/12/2023, 12:59 PM

## 2023-04-12 NOTE — ED Notes (Signed)
Pt asleep, O2 sat at 88%. Placed pt on 2L [place] for comfort. Pt O2 at 93%. Pt has no complaints at this time. Will continue to monitor pt

## 2023-04-12 NOTE — Assessment & Plan Note (Addendum)
-  New dysarthria  -Chronic left sided weakness -Neuro rec's admission for CVA/TIA workup -Echo in the AM -MRI = no acute abnormalities, previously known right basal ganglia stroke -PT/OT/ST eval and treat -ASA 325mg  given in ED -Continue aspirin and plavix -Continue statin -Permissive HTN -Continue to monitor

## 2023-04-12 NOTE — ED Notes (Signed)
Poison control advised to monitor for 6 hours and to get an EKG. EKG obtained and crossed over in the chart. EDP made aware. Will continue to monitor

## 2023-04-12 NOTE — Assessment & Plan Note (Signed)
Continue PPI ?

## 2023-04-12 NOTE — Assessment & Plan Note (Signed)
-  Working on cessation -half a pack per day -Declines nicotine patch at this time

## 2023-04-12 NOTE — Evaluation (Signed)
Physical Therapy Evaluation Patient Details Name: Brandon Martin MRN: [id number] DOB: [birth year] Today's Date: 04/12/2023  History of Present Illness  Brandon Martin is a 57 y.o. male with medical history significant of coronary artery disease, GERD, hyperlipidemia, hypertension, tobacco use disorder, previous stroke with left-sided deficits, presents to the ED with a chief complaint of confusion and slurred speech.  Patient reported earlier that his last known well was 19:45, but he reports to me that his last known well was 17:45.  He reports he had fallen outside earlier.  It was a mechanical fall.  He stepped backwards his foot hit a block and he fell onto his right side.  He did not hit his head.  He reports he had some whiplash in his neck.  He went into cook dinner, and then he realized he was confused when someone asked him what he wanted to watch on TV and he could not remember anything per his report.  He reportedly had slurred speech at that time as well.  Patient reports up until then he had been in his normal state of health.  At the time of admission he has a headache, but he reports the headache started after the dysarthria went away.  His headache is located behind his left eye.  It is not affecting his vision.  It is keeping him from sleeping.  As of yet, we have not tried anything to abort the headache, but he is going to try some Tylenol now.  It is worth noting the patient has had trouble with access to medication.  He has changed pharmacies and the new pharmacy supposed to be sending him meds but has not yet.  He has not take any of his chronic medications for 10 days.  He reports he is also been stressed at home.  His mother is in poor health and he has been worried about her.  Patient has no other complaints at this time.   Clinical Impression  Patient functioning at baseline for functional mobility and gait other than c/o of severe left eye pain.  Patient demonstrates good return for bed  mobility, transfers and ambulating in room/hallways without loss of balance.  Plan:  Patient discharged from physical therapy to care of nursing for ambulation daily as tolerated for length of stay.          If plan is discharge home, recommend the following: Other (comment) (near baseline other c/o severe pain in left eye)   Can travel by private vehicle        Equipment Recommendations None recommended by PT  Recommendations for Other Services       Functional Status Assessment Patient has not had a recent decline in their functional status     Precautions / Restrictions Precautions Precautions: None Restrictions Weight Bearing Restrictions: No      Mobility  Bed Mobility Overal bed mobility: Independent                  Transfers Overall transfer level: Independent                      Ambulation/Gait Ambulation/Gait assistance: Modified independent (Device/Increase time) Gait Distance (Feet): 200 Feet Assistive device: None Gait Pattern/deviations: WFL(Within Functional Limits) Gait velocity: slightly decreased     General Gait Details: grossly WFL with good return for ambulating in room, hallways without loss of balance  Stairs            Wheelchair  Mobility     Tilt Bed    Modified Rankin (Stroke Patients Only)       Balance Overall balance assessment: Mild deficits observed, not formally tested                                           Pertinent Vitals/Pain Pain Assessment Pain Score: 9  Pain Location: L eye Pain Descriptors / Indicators: Sharp, Grimacing, Guarding Pain Intervention(s): Limited activity within patient's tolerance, Monitored during session    Home Living Family/patient expects to be discharged to:: Private residence Living Arrangements: Parent;Other relatives Available Help at Discharge: Family;Available 24 hours/day Type of Home: Mobile home Home Access: Ramped entrance        Home Layout: One level Home Equipment: Agricultural consultant (2 wheels);Cane - single point;Wheelchair - manual      Prior Function Prior Level of Function : Independent/Modified Independent             Mobility Comments: Ambulates without AD ADLs Comments: Independent     Extremity/Trunk Assessment   Upper Extremity Assessment Upper Extremity Assessment: Defer to OT evaluation LUE Deficits / Details: 4+/5 grossly ; mild fine motor coordination deficits LUE Sensation: decreased light touch LUE Coordination: decreased fine motor    Lower Extremity Assessment Lower Extremity Assessment: Overall WFL for tasks assessed    Cervical / Trunk Assessment Cervical / Trunk Assessment: Normal  Communication   Communication Communication: No apparent difficulties  Cognition Arousal: Alert Behavior During Therapy: WFL for tasks assessed/performed Overall Cognitive Status: Within Functional Limits for tasks assessed                                          General Comments      Exercises     Assessment/Plan    PT Assessment Patient does not need any further PT services  PT Problem List         PT Treatment Interventions      PT Goals (Current goals can be found in the Care Plan section)  Acute Rehab PT Goals Patient Stated Goal: return home PT Goal Formulation: With patient Time For Goal Achievement: 04/12/23 Potential to Achieve Goals: Good    Frequency       Co-evaluation PT/OT/SLP Co-Evaluation/Treatment: Yes Reason for Co-Treatment: To address functional/ADL transfers PT goals addressed during session: Mobility/safety with mobility;Balance OT goals addressed during session: ADL's and self-care       AM-PAC PT "6 Clicks" Mobility  Outcome Measure Help needed turning from your back to your side while in a flat bed without using bedrails?: None Help needed moving from lying on your back to sitting on the side of a flat bed without using  bedrails?: None Help needed moving to and from a bed to a chair (including a wheelchair)?: None Help needed standing up from a chair using your arms (e.g., wheelchair or bedside chair)?: None Help needed to walk in hospital room?: None Help needed climbing 3-5 steps with a railing? : None 6 Click Score: 24    End of Session   Activity Tolerance: Patient tolerated treatment well Patient left: in bed;with call bell/phone within reach Nurse Communication: Mobility status PT Visit Diagnosis: Unsteadiness on feet (R26.81);Other abnormalities of gait and mobility (R26.89);Muscle weakness (generalized) (M62.81)    Time:  1610-9604 PT Time Calculation (min) (ACUTE ONLY): 20 min   Charges:   PT Evaluation $PT Eval Moderate Complexity: 1 Mod PT Treatments $Therapeutic Activity: 8-22 mins PT General Charges $$ ACUTE PT VISIT: 1 Visit         11:58 AM, 04/12/23 Ocie Bob, MPT Physical Therapist with [facility] [address] [phone] office [phone] mobile phone

## 2023-04-12 NOTE — Progress Notes (Signed)
Patient seen and examined; admitted after midnight secondary to stroke like symptoms.  Patient with prior history of CVA and residual left-sided weakness.  Presenting with new dysarthria and worsening weakness.  CT scan of the brain not demonstrating acute intracranial abnormalities or hemorrhage.  Hemodynamically stable and was able to passed swallowing evaluation in the ED.  Please refer to H&P written by Dr. Carren Rang for further info/details on admission.  Plan: -Follow neurology recommendation -Complete stroke workup -PT/OT evaluation -Continue supportive care, allow permissive hypertension and follow clinical response.  Vassie Loll MD [phone]

## 2023-04-12 NOTE — Assessment & Plan Note (Addendum)
-  Continue aspirin, plavix, statin -Hold metoprolol in the setting of permissive HTN -Continue to monitor

## 2023-04-12 NOTE — Progress Notes (Signed)
Pt reports he lives with his brother and mother. He is typically independent with ADLs. OT recommending outpatient OT. Discussed with pt who is agreeable and brother will provide transport. Requests [facility]. Referral made. No other needs reported.    04/12/23 1041  TOC Brief Assessment  Insurance and Status Reviewed  Patient has primary care physician Yes  Home environment has been reviewed Lives with brother and mother.  Prior level of function: Independent at baseline.  Prior/Current Home Services No current home services  Social Determinants of Health Reivew SDOH reviewed no interventions necessary  Readmission risk has been reviewed Yes  Transition of care needs transition of care needs identified, TOC will continue to follow

## 2023-04-12 NOTE — Progress Notes (Signed)
Addendum: I have been contacted by neurology service who after reviewing MRI and noticing no acute abnormalities recommended no changes on patient's prior to admission secondary prevention therapy (continue the use of aspirin, statin and prefactor modifications).  Vassie Loll MD [phone]

## 2023-04-12 NOTE — ED Notes (Signed)
Hospitalist at bedside 

## 2023-04-12 NOTE — ED Notes (Addendum)
[facility] pharmacy advised this RN to call poison control for further input. This RN contacted poison control immediately

## 2023-04-12 NOTE — Evaluation (Signed)
Occupational Therapy Evaluation Patient Details Name: Brandon Martin MRN: [id number] DOB: Jan 16, 1966 Today's Date: 04/12/2023   History of Present Illness Brandon Martin is a 57 y.o. male with medical history significant of coronary artery disease, GERD, hyperlipidemia, hypertension, tobacco use disorder, previous stroke with left-sided deficits, presents to the ED with a chief complaint of confusion and slurred speech.  Patient reported earlier that his last known well was 19:45, but he reports to me that his last known well was 17:45.  He reports he had fallen outside earlier.  It was a mechanical fall.  He stepped backwards his foot hit a block and he fell onto his right side.  He did not hit his head.  He reports he had some whiplash in his neck.  He went into cook dinner, and then he realized he was confused when someone asked him what he wanted to watch on TV and he could not remember anything per his report.  He reportedly had slurred speech at that time as well.  Patient reports up until then he had been in his normal state of health.  At the time of admission he has a headache, but he reports the headache started after the dysarthria went away.  His headache is located behind his left eye.  It is not affecting his vision.  It is keeping him from sleeping.  As of yet, we have not tried anything to abort the headache, but he is going to try some Tylenol now.  It is worth noting the patient has had trouble with access to medication.  He has changed pharmacies and the new pharmacy supposed to be sending him meds but has not yet.  He has not take any of his chronic medications for 10 days.  He reports he is also been stressed at home.  His mother is in poor health and he has been worried about her.  Patient has no other complaints at this time. (per DO)   Clinical Impression   Pt agreeable to OT and PT co-evaluation. Pt appears to be near baseline function, but L UE may be mildly impaired compared to  baseline. This is difficult to say accurately due to pt's prior history of stroke and L side deficits. Pt can ambulate and dress independently with mild L UE deficits. Pt is not recommended for further acute OT services and will be discharged to care of nursing staff for remaining length of stay.       If plan is discharge home, recommend the following: Assist for transportation    Functional Status Assessment  Patient has had a recent decline in their functional status and demonstrates the ability to make significant improvements in function in a reasonable and predictable amount of time. (Possibly; difficult to establish L UE baseline.)  Equipment Recommendations  None recommended by OT    Recommendations for Other Services       Precautions / Restrictions Precautions Precautions: None Restrictions Weight Bearing Restrictions: No      Mobility Bed Mobility Overal bed mobility: Independent                  Transfers Overall transfer level: Independent                        Balance Overall balance assessment: Mild deficits observed, not formally tested  ADL either performed or assessed with clinical judgement   ADL Overall ADL's : Independent                                             Vision Baseline Vision/History:  (Reports difficulty seeing things near him.) Ability to See in Adequate Light: 1 Impaired Patient Visual Report: No change from baseline Vision Assessment?: No apparent visual deficits     Perception Perception: Not tested       Praxis Praxis: Not tested       Pertinent Vitals/Pain Pain Assessment Pain Assessment: 0-10 Pain Score: 9  Pain Location: L eye Pain Descriptors / Indicators: Other (Comment) ("like I want to pull it out") Pain Intervention(s): Limited activity within patient's tolerance, Monitored during session, Repositioned      Extremity/Trunk Assessment Upper Extremity Assessment Upper Extremity Assessment: Right hand dominant;LUE deficits/detail (mild R UE fine motor coordination difficulty as well.) LUE Deficits / Details: 4+/5 grossly ; mild fine motor coordination deficits; mild L shoulder flexion A/ROM deficits.  LUE Sensation: decreased light touch LUE Coordination: decreased fine motor   Lower Extremity Assessment Lower Extremity Assessment: Defer to PT evaluation   Cervical / Trunk Assessment Cervical / Trunk Assessment: Normal   Communication Communication Communication: No apparent difficulties   Cognition Arousal: Alert Behavior During Therapy: WFL for tasks assessed/performed Overall Cognitive Status: Within Functional Limits for tasks assessed                                       General Comments       Exercises     Shoulder Instructions      Home Living Family/patient expects to be discharged to:: Private residence Living Arrangements: Parent;Other relatives Available Help at Discharge: Family;Available 24 hours/day Type of Home: Mobile home Home Access: Ramped entrance     Home Layout: One level     Bathroom Shower/Tub: Chief Strategy Officer: Handicapped height Bathroom Accessibility: Yes How Accessible: Accessible via walker Home Equipment: Rolling Walker (2 wheels);Cane - single point;Wheelchair - manual          Prior Functioning/Environment Prior Level of Function : Independent/Modified Independent             Mobility Comments: Ambulates without AD ADLs Comments: Independent                                Co-evaluation PT/OT/SLP Co-Evaluation/Treatment: Yes Reason for Co-Treatment: To address functional/ADL transfers   OT goals addressed during session: ADL's and self-care      AM-PAC OT "6 Clicks" Daily Activity     Outcome Measure Help from another person eating meals?: None Help from another person  taking care of personal grooming?: None Help from another person toileting, which includes using toliet, bedpan, or urinal?: None Help from another person bathing (including washing, rinsing, drying)?: None Help from another person to put on and taking off regular upper body clothing?: None Help from another person to put on and taking off regular lower body clothing?: None 6 Click Score: 24   End of Session    Activity Tolerance: Patient tolerated treatment well Patient left: in bed;with call bell/phone within reach  OT Visit Diagnosis: Muscle weakness (generalized) (  M62.81);Other symptoms and signs involving the nervous system (R29.898)                Time: 6644-0347 OT Time Calculation (min): 15 min Charges:  OT General Charges $OT Visit: 1 Visit OT Evaluation $OT Eval Low Complexity: 1 Low  Jonta Gastineau OT, MOT  Danie Chandler 04/12/2023, 9:38 AM

## 2023-04-12 NOTE — Assessment & Plan Note (Signed)
-   Holding norvasc and metoprolol for permissive HTN

## 2023-04-13 DIAGNOSIS — R471 Dysarthria and anarthria: Secondary | ICD-10-CM | POA: Diagnosis not present

## 2023-04-13 DIAGNOSIS — I10 Essential (primary) hypertension: Secondary | ICD-10-CM | POA: Diagnosis not present

## 2023-04-13 DIAGNOSIS — F17200 Nicotine dependence, unspecified, uncomplicated: Secondary | ICD-10-CM | POA: Diagnosis not present

## 2023-04-13 LAB — BASIC METABOLIC PANEL
Anion gap: 10 (ref 5–15)
BUN: 15 mg/dL (ref 6–20)
CO2: 27 mmol/L (ref 22–32)
Calcium: 8.9 mg/dL (ref 8.9–10.3)
Chloride: 101 mmol/L (ref 98–111)
Creatinine, Ser: 1.13 mg/dL (ref 0.61–1.24)
GFR, Estimated: >60 mL/min (ref >60)
Glucose, Bld: 121 mg/dL — ABNORMAL HIGH (ref 70–99)
Potassium: 3.6 mmol/L (ref 3.5–5.1)
Sodium: 138 mmol/L (ref 135–145)

## 2023-04-13 LAB — CBC
HCT: 46 % (ref 39.0–52.0)
Hemoglobin: 15.4 g/dL (ref 13.0–17.0)
MCH: 30.1 pg (ref 26.0–34.0)
MCHC: 33.5 g/dL (ref 30.0–36.0)
MCV: 90 fL (ref 80.0–100.0)
Platelets: 220 10*3/uL (ref 150–400)
RBC: 5.11 MIL/uL (ref 4.22–5.81)
RDW: 12.8 % (ref 11.5–15.5)
WBC: 7.4 10*3/uL (ref 4.0–10.5)
nRBC: 0 % (ref 0.0–0.2)

## 2023-04-13 LAB — GLUCOSE, CAPILLARY: Glucose-Capillary: 136 mg/dL — ABNORMAL HIGH (ref 70–99)

## 2023-04-13 MED ORDER — METOCLOPRAMIDE HCL 5 MG/ML IJ SOLN
10.0000 mg | Freq: Once | INTRAMUSCULAR | Status: AC
  Administered 2023-04-13: 10 mg via INTRAVENOUS
  Filled 2023-04-13: qty 2

## 2023-04-13 MED ORDER — DIPHENHYDRAMINE HCL 50 MG/ML IJ SOLN
25.0000 mg | Freq: Once | INTRAMUSCULAR | Status: AC
  Administered 2023-04-13: 25 mg via INTRAVENOUS
  Filled 2023-04-13: qty 1

## 2023-04-13 MED ORDER — METOPROLOL SUCCINATE ER 50 MG PO TB24
50.0000 mg | ORAL_TABLET | Freq: Every day | ORAL | Status: DC
  Administered 2023-04-13: 50 mg via ORAL
  Filled 2023-04-13: qty 1

## 2023-04-13 MED ORDER — KETOROLAC TROMETHAMINE 15 MG/ML IJ SOLN
15.0000 mg | Freq: Once | INTRAMUSCULAR | Status: AC
  Administered 2023-04-13: 15 mg via INTRAVENOUS
  Filled 2023-04-13: qty 1

## 2023-04-13 MED ORDER — ROSUVASTATIN CALCIUM 40 MG PO TABS: 40.0000 mg | ORAL_TABLET | Freq: Every day | ORAL | 1 refills | Status: AC

## 2023-04-13 MED ORDER — ROSUVASTATIN CALCIUM 20 MG PO TABS: 40.0000 mg | ORAL_TABLET | Freq: Every day | ORAL | Status: DC

## 2023-04-13 NOTE — Discharge Summary (Addendum)
Physician Discharge Summary   Patient: Brandon Martin MRN: [id number] DOB: [birth year]  Admit date:     04/11/2023  Discharge date: 04/13/23  Discharge Physician: Onalee Hua Aleigha Gilani   PCP: Joycelyn Rua, MD   Recommendations at discharge:   Please follow up with primary care provider within 1-2 weeks  Please repeat BMP and CBC in one week     Hospital Course: 57 y.o. male with medical history significant of coronary artery disease, GERD, hyperlipidemia, hypertension, tobacco use disorder, previous stroke with left-sided deficits, presents to the ED with a chief complaint of confusion and slurred speech.  Patient reported earlier that his last known well was 19:45, but he reports to me that his last known well was 17:45.  He reports he had fallen outside earlier.  It was a mechanical fall.  He stepped backwards his foot hit a block and he fell onto his right side.  He did not hit his head.  He reports he had some whiplash in his neck.  He went into cook dinner, and then he realized he was confused when someone asked him what he wanted to watch on TV and he could not remember anything per his report.  He reportedly had slurred speech at that time as well.  Patient reports up until then he had been in his normal state of health.  At the time of admission he has a headache, but he reports the headache started after the dysarthria went away.  His headache is located behind his left eye.  It is not affecting his vision.  It is keeping him from sleeping.  As of yet, we have not tried anything to abort the headache, but he is going to try some Tylenol now.  It is worth noting the patient has had trouble with access to medication.  He has changed pharmacies and the new pharmacy supposed to be sending him meds but has not yet.  He has not take any of his chronic medications for 10 days.  He reports he is also been stressed at home.  His mother is in poor health and he has been worried about her.   Assessment and  Plan: Dysarthria -Chronic left sided weakness -I discussed with neurology--Dr. Alonza Smoker MR brain neg--no new changes--continue ASA 81 mg and plavix 75 mg -Neuro rec's admission for CVA/TIA workup -Echo--EF 50-55%, G1DD, trivial MR/TR, no PFO -MRI = no acute abnormalities, previously known right basal ganglia stroke -PT/OT/ST eval and treat>>no PT follow up needed -ASA 325mg  given in ED -Continue aspirin and plavix -Continue statin>>increase to crestor 40 mg -Permissive HTN initially   Essential hypertension - Holding norvasc and metoprolol for permissive HTN -resume after d/c   Tobacco use disorder -Working on cessation -half a pack per day -Declines nicotine patch at this time   Mixed hyperlipidemia -Continue statin -LDL 126 -increase crestor to 40 mg daily   GERD (gastroesophageal reflux disease) -Continue PPI   Coronary artery disease -Continue aspirin, plavix, statin -Hold metoprolol in the setting of permissive HTN>>restasrt -no chest pain  THC use -UDS positive for THC -may have contributed to his dysrthria and feeling of confusion  Imparied glucose tolerance -A1C--5.8 -lifestyle modificiation      Consultants: neurology Procedures performed: none  Disposition: Home Diet recommendation:  Carb modified diet DISCHARGE MEDICATION: Allergies as of 04/13/2023       Reactions   Aspirin Other (See Comments)   HX of gastric ulcers Can't tolerate large doses   Bee Pollen Other (See  Physician Discharge Summary   Patient: Brandon Martin MRN: [id number] DOB: [birth year]  Admit date:     04/11/2023  Discharge date: 04/13/23  Discharge Physician: Onalee Hua Aleigha Gilani   PCP: Joycelyn Rua, MD   Recommendations at discharge:   Please follow up with primary care provider within 1-2 weeks  Please repeat BMP and CBC in one week     Hospital Course: 57 y.o. male with medical history significant of coronary artery disease, GERD, hyperlipidemia, hypertension, tobacco use disorder, previous stroke with left-sided deficits, presents to the ED with a chief complaint of confusion and slurred speech.  Patient reported earlier that his last known well was 19:45, but he reports to me that his last known well was 17:45.  He reports he had fallen outside earlier.  It was a mechanical fall.  He stepped backwards his foot hit a block and he fell onto his right side.  He did not hit his head.  He reports he had some whiplash in his neck.  He went into cook dinner, and then he realized he was confused when someone asked him what he wanted to watch on TV and he could not remember anything per his report.  He reportedly had slurred speech at that time as well.  Patient reports up until then he had been in his normal state of health.  At the time of admission he has a headache, but he reports the headache started after the dysarthria went away.  His headache is located behind his left eye.  It is not affecting his vision.  It is keeping him from sleeping.  As of yet, we have not tried anything to abort the headache, but he is going to try some Tylenol now.  It is worth noting the patient has had trouble with access to medication.  He has changed pharmacies and the new pharmacy supposed to be sending him meds but has not yet.  He has not take any of his chronic medications for 10 days.  He reports he is also been stressed at home.  His mother is in poor health and he has been worried about her.   Assessment and  Plan: Dysarthria -Chronic left sided weakness -I discussed with neurology--Dr. Alonza Smoker MR brain neg--no new changes--continue ASA 81 mg and plavix 75 mg -Neuro rec's admission for CVA/TIA workup -Echo--EF 50-55%, G1DD, trivial MR/TR, no PFO -MRI = no acute abnormalities, previously known right basal ganglia stroke -PT/OT/ST eval and treat>>no PT follow up needed -ASA 325mg  given in ED -Continue aspirin and plavix -Continue statin>>increase to crestor 40 mg -Permissive HTN initially   Essential hypertension - Holding norvasc and metoprolol for permissive HTN -resume after d/c   Tobacco use disorder -Working on cessation -half a pack per day -Declines nicotine patch at this time   Mixed hyperlipidemia -Continue statin -LDL 126 -increase crestor to 40 mg daily   GERD (gastroesophageal reflux disease) -Continue PPI   Coronary artery disease -Continue aspirin, plavix, statin -Hold metoprolol in the setting of permissive HTN>>restasrt -no chest pain  THC use -UDS positive for THC -may have contributed to his dysrthria and feeling of confusion  Imparied glucose tolerance -A1C--5.8 -lifestyle modificiation      Consultants: neurology Procedures performed: none  Disposition: Home Diet recommendation:  Carb modified diet DISCHARGE MEDICATION: Allergies as of 04/13/2023       Reactions   Aspirin Other (See Comments)   HX of gastric ulcers Can't tolerate large doses   Bee Pollen Other (See  Physician Discharge Summary   Patient: Brandon Martin MRN: [id number] DOB: [birth year]  Admit date:     04/11/2023  Discharge date: 04/13/23  Discharge Physician: Onalee Hua Aleigha Gilani   PCP: Joycelyn Rua, MD   Recommendations at discharge:   Please follow up with primary care provider within 1-2 weeks  Please repeat BMP and CBC in one week     Hospital Course: 57 y.o. male with medical history significant of coronary artery disease, GERD, hyperlipidemia, hypertension, tobacco use disorder, previous stroke with left-sided deficits, presents to the ED with a chief complaint of confusion and slurred speech.  Patient reported earlier that his last known well was 19:45, but he reports to me that his last known well was 17:45.  He reports he had fallen outside earlier.  It was a mechanical fall.  He stepped backwards his foot hit a block and he fell onto his right side.  He did not hit his head.  He reports he had some whiplash in his neck.  He went into cook dinner, and then he realized he was confused when someone asked him what he wanted to watch on TV and he could not remember anything per his report.  He reportedly had slurred speech at that time as well.  Patient reports up until then he had been in his normal state of health.  At the time of admission he has a headache, but he reports the headache started after the dysarthria went away.  His headache is located behind his left eye.  It is not affecting his vision.  It is keeping him from sleeping.  As of yet, we have not tried anything to abort the headache, but he is going to try some Tylenol now.  It is worth noting the patient has had trouble with access to medication.  He has changed pharmacies and the new pharmacy supposed to be sending him meds but has not yet.  He has not take any of his chronic medications for 10 days.  He reports he is also been stressed at home.  His mother is in poor health and he has been worried about her.   Assessment and  Plan: Dysarthria -Chronic left sided weakness -I discussed with neurology--Dr. Alonza Smoker MR brain neg--no new changes--continue ASA 81 mg and plavix 75 mg -Neuro rec's admission for CVA/TIA workup -Echo--EF 50-55%, G1DD, trivial MR/TR, no PFO -MRI = no acute abnormalities, previously known right basal ganglia stroke -PT/OT/ST eval and treat>>no PT follow up needed -ASA 325mg  given in ED -Continue aspirin and plavix -Continue statin>>increase to crestor 40 mg -Permissive HTN initially   Essential hypertension - Holding norvasc and metoprolol for permissive HTN -resume after d/c   Tobacco use disorder -Working on cessation -half a pack per day -Declines nicotine patch at this time   Mixed hyperlipidemia -Continue statin -LDL 126 -increase crestor to 40 mg daily   GERD (gastroesophageal reflux disease) -Continue PPI   Coronary artery disease -Continue aspirin, plavix, statin -Hold metoprolol in the setting of permissive HTN>>restasrt -no chest pain  THC use -UDS positive for THC -may have contributed to his dysrthria and feeling of confusion  Imparied glucose tolerance -A1C--5.8 -lifestyle modificiation      Consultants: neurology Procedures performed: none  Disposition: Home Diet recommendation:  Carb modified diet DISCHARGE MEDICATION: Allergies as of 04/13/2023       Reactions   Aspirin Other (See Comments)   HX of gastric ulcers Can't tolerate large doses   Bee Pollen Other (See  Comments)   Seasonal allergies   Morphine Other (See Comments)   unknown   Morphine And Codeine Nausea And Vomiting   Latex Rash        Medication List     TAKE these medications    ALPRAZolam 0.25 MG tablet Commonly known as: XANAX Take 0.25 mg by mouth at bedtime.   amLODipine 10 MG tablet Commonly known as: NORVASC Take 1 tablet (10 mg total) by mouth daily.   aspirin EC 81 MG tablet Take 1 tablet (81 mg total) by mouth daily. Swallow whole.    clopidogrel 75 MG tablet Commonly known as: PLAVIX Take 75 mg by mouth every morning.   gabapentin 300 MG capsule Commonly known as: NEURONTIN TAKE TWO CAPSULES BY MOUTH TWICE DAILY   metFORMIN 500 MG tablet Commonly known as: GLUCOPHAGE TAKE 2 TABLETS IN THE MORNING WITH A MEAL, 1 TABLET IN THE EVENING WITH A MEAL   metoprolol succinate 50 MG 24 hr tablet Commonly known as: TOPROL-XL Take 1 tablet (50 mg total) by mouth daily. Take with or immediately following a meal.   nitroGLYCERIN 0.4 MG SL tablet Commonly known as: NITROSTAT PLACE 1 TABLET UNDER TONGUE AS DIRECTED AS NEEDED FOR CHEST PAIN   pantoprazole 40 MG tablet Commonly known as: PROTONIX Take 1 tablet by mouth 2 (two) times daily.   rosuvastatin 40 MG tablet Commonly known as: CRESTOR Take 1 tablet (40 mg total) by mouth daily. Start taking on: April 14, 2023 What changed:  medication strength how much to take        Discharge Exam: Filed Weights   04/11/23 2110  Weight: 85.8 kg   HEENT:  [place]/AT, No thrush, no icterus CV:  RRR, no rub, no S3, no S4 Lung:  CTA, no wheeze, no rhonchi Abd:  soft/+BS, NT Ext:  No edema, no lymphangitis, no synovitis, no rash Neuro:  CN II-XII intact, strength 4/5 in RUE, RLE, strength 4-/5 LUE, LLE; sensation intact bilateral; no dysmetria; babinski equivocal   Condition at discharge: stable  The results of significant diagnostics from this hospitalization (including imaging, microbiology, ancillary and laboratory) are listed below for reference.   Imaging Studies: ECHOCARDIOGRAM COMPLETE  Result Date: 04/12/2023    ECHOCARDIOGRAM REPORT   Patient Name:   GERAMY LAMORTE Date of Exam: 04/12/2023 Medical Rec #:  [id number]    Height:       68.0 in Accession #:    [id number]   Weight:       189.1 lb Date of Birth:  02-22-1966    BSA:          1.996 m Patient Age:    57 years     BP:           161/97 mmHg Patient Gender: M            HR:           63 bpm. [facility] Procedure: 2D Echo, Cardiac Doppler, Color Doppler and Intracardiac            Opacification Agent Indications:    Stroke  History:        Patient has prior history of Echocardiogram examinations, most                 recent 12/07/2016. CAD, Stroke; Risk Factors:Current Smoker,                 Dyslipidemia and Hypertension.  Sonographer:    Inetta Fermo  Comments)   Seasonal allergies   Morphine Other (See Comments)   unknown   Morphine And Codeine Nausea And Vomiting   Latex Rash        Medication List     TAKE these medications    ALPRAZolam 0.25 MG tablet Commonly known as: XANAX Take 0.25 mg by mouth at bedtime.   amLODipine 10 MG tablet Commonly known as: NORVASC Take 1 tablet (10 mg total) by mouth daily.   aspirin EC 81 MG tablet Take 1 tablet (81 mg total) by mouth daily. Swallow whole.    clopidogrel 75 MG tablet Commonly known as: PLAVIX Take 75 mg by mouth every morning.   gabapentin 300 MG capsule Commonly known as: NEURONTIN TAKE TWO CAPSULES BY MOUTH TWICE DAILY   metFORMIN 500 MG tablet Commonly known as: GLUCOPHAGE TAKE 2 TABLETS IN THE MORNING WITH A MEAL, 1 TABLET IN THE EVENING WITH A MEAL   metoprolol succinate 50 MG 24 hr tablet Commonly known as: TOPROL-XL Take 1 tablet (50 mg total) by mouth daily. Take with or immediately following a meal.   nitroGLYCERIN 0.4 MG SL tablet Commonly known as: NITROSTAT PLACE 1 TABLET UNDER TONGUE AS DIRECTED AS NEEDED FOR CHEST PAIN   pantoprazole 40 MG tablet Commonly known as: PROTONIX Take 1 tablet by mouth 2 (two) times daily.   rosuvastatin 40 MG tablet Commonly known as: CRESTOR Take 1 tablet (40 mg total) by mouth daily. Start taking on: April 14, 2023 What changed:  medication strength how much to take        Discharge Exam: Filed Weights   04/11/23 2110  Weight: 85.8 kg   HEENT:  [place]/AT, No thrush, no icterus CV:  RRR, no rub, no S3, no S4 Lung:  CTA, no wheeze, no rhonchi Abd:  soft/+BS, NT Ext:  No edema, no lymphangitis, no synovitis, no rash Neuro:  CN II-XII intact, strength 4/5 in RUE, RLE, strength 4-/5 LUE, LLE; sensation intact bilateral; no dysmetria; babinski equivocal   Condition at discharge: stable  The results of significant diagnostics from this hospitalization (including imaging, microbiology, ancillary and laboratory) are listed below for reference.   Imaging Studies: ECHOCARDIOGRAM COMPLETE  Result Date: 04/12/2023    ECHOCARDIOGRAM REPORT   Patient Name:   GERAMY LAMORTE Date of Exam: 04/12/2023 Medical Rec #:  [id number]    Height:       68.0 in Accession #:    [id number]   Weight:       189.1 lb Date of Birth:  02-22-1966    BSA:          1.996 m Patient Age:    57 years     BP:           161/97 mmHg Patient Gender: M            HR:           63 bpm. [facility] Procedure: 2D Echo, Cardiac Doppler, Color Doppler and Intracardiac            Opacification Agent Indications:    Stroke  History:        Patient has prior history of Echocardiogram examinations, most                 recent 12/07/2016. CAD, Stroke; Risk Factors:Current Smoker,                 Dyslipidemia and Hypertension.  Sonographer:    Inetta Fermo

## 2023-04-13 NOTE — Care Management Obs Status (Signed)
MEDICARE OBSERVATION STATUS NOTIFICATION   Patient Details  Name: Brandon Martin MRN: [id number] Date of Birth: June 03, 1966   Medicare Observation Status Notification Given:  Yes    Wacey Zieger Feliz Beam 04/13/2023, 8:20 AM

## 2023-04-13 NOTE — Hospital Course (Signed)
57 y.o. male with medical history significant of coronary artery disease, GERD, hyperlipidemia, hypertension, tobacco use disorder, previous stroke with left-sided deficits, presents to the ED with a chief complaint of confusion and slurred speech.  Patient reported earlier that his last known well was 19:45, but he reports to me that his last known well was 17:45.  He reports he had fallen outside earlier.  It was a mechanical fall.  He stepped backwards his foot hit a block and he fell onto his right side.  He did not hit his head.  He reports he had some whiplash in his neck.  He went into cook dinner, and then he realized he was confused when someone asked him what he wanted to watch on TV and he could not remember anything per his report.  He reportedly had slurred speech at that time as well.  Patient reports up until then he had been in his normal state of health.  At the time of admission he has a headache, but he reports the headache started after the dysarthria went away.  His headache is located behind his left eye.  It is not affecting his vision.  It is keeping him from sleeping.  As of yet, we have not tried anything to abort the headache, but he is going to try some Tylenol now.  It is worth noting the patient has had trouble with access to medication.  He has changed pharmacies and the new pharmacy supposed to be sending him meds but has not yet.  He has not take any of his chronic medications for 10 days.  He reports he is also been stressed at home.  His mother is in poor health and he has been worried about her.

## 2023-04-13 NOTE — Progress Notes (Signed)
Showing no stroke deficits and ambulated independently in hallway last night. C/O pain behind left eye with no vision changes.  Stated that started in last three days.  Tylenol did not help pain and received order for one time dose of reglan, benadryl and toradol IV which helped almost immediately.

## 2023-04-14 ENCOUNTER — Ambulatory Visit: Payer: Medicare Other | Admitting: Podiatry

## 2023-04-14 ENCOUNTER — Encounter: Payer: Self-pay | Admitting: Podiatry

## 2023-04-14 DIAGNOSIS — D689 Coagulation defect, unspecified: Secondary | ICD-10-CM | POA: Diagnosis not present

## 2023-04-14 DIAGNOSIS — M79676 Pain in unspecified toe(s): Secondary | ICD-10-CM | POA: Diagnosis not present

## 2023-04-14 DIAGNOSIS — B351 Tinea unguium: Secondary | ICD-10-CM | POA: Diagnosis not present

## 2023-04-14 DIAGNOSIS — D492 Neoplasm of unspecified behavior of bone, soft tissue, and skin: Secondary | ICD-10-CM

## 2023-04-14 DIAGNOSIS — D2372 Other benign neoplasm of skin of left lower limb, including hip: Secondary | ICD-10-CM | POA: Diagnosis not present

## 2023-04-14 DIAGNOSIS — E1142 Type 2 diabetes mellitus with diabetic polyneuropathy: Secondary | ICD-10-CM

## 2023-04-14 DIAGNOSIS — D2371 Other benign neoplasm of skin of right lower limb, including hip: Secondary | ICD-10-CM

## 2023-04-14 MED ORDER — GABAPENTIN 800 MG PO TABS: 800.0000 mg | ORAL_TABLET | Freq: Three times a day (TID) | ORAL | 3 refills | Status: DC

## 2023-04-14 NOTE — Progress Notes (Signed)
He presents today after having not been here for couple of years for diabetic foot exam.  His last A1c was at 5.8 he states that he needs his toenails cut and he is having a lot of burning pain and numbness in his feet states that it really started before he gave out of gabapentin but he has been out of gabapentin now for quite some time.  Objective: Vital signs are stable alert oriented x 3 pulses are palpable.  Neurologic sensorium is diminished per Semmes Weinstein monofilament.  Deep tendon reflexes are diminished.  Muscle strength is normal and symmetrical bilateral.  No open lesions or wounds are noted.  His toenails are long thick yellow dystrophic clinical mycotic painful on palpation with sharp incurvated nail margins.  Assessment: Pain in limb secondary to diabetic peripheral neuropathy and onychomycosis of the nails.  Plan: Started him on 800 mg 3 times a day gabapentin increased from 600 mg twice a day.  And I debrided nails 1 through 5 bilateral as well as calluses and benign skin lesions.

## 2023-04-19 DIAGNOSIS — K219 Gastro-esophageal reflux disease without esophagitis: Secondary | ICD-10-CM | POA: Diagnosis not present

## 2023-04-19 DIAGNOSIS — I679 Cerebrovascular disease, unspecified: Secondary | ICD-10-CM | POA: Diagnosis not present

## 2023-04-19 DIAGNOSIS — I2510 Atherosclerotic heart disease of native coronary artery without angina pectoris: Secondary | ICD-10-CM | POA: Diagnosis not present

## 2023-04-19 DIAGNOSIS — J432 Centrilobular emphysema: Secondary | ICD-10-CM | POA: Diagnosis not present

## 2023-04-19 DIAGNOSIS — Z720 Tobacco use: Secondary | ICD-10-CM | POA: Diagnosis not present

## 2023-04-19 DIAGNOSIS — I700 Atherosclerosis of aorta: Secondary | ICD-10-CM | POA: Diagnosis not present

## 2023-04-19 DIAGNOSIS — D126 Benign neoplasm of colon, unspecified: Secondary | ICD-10-CM | POA: Diagnosis not present

## 2023-04-19 DIAGNOSIS — E7439 Other disorders of intestinal carbohydrate absorption: Secondary | ICD-10-CM | POA: Diagnosis not present

## 2023-04-19 DIAGNOSIS — R918 Other nonspecific abnormal finding of lung field: Secondary | ICD-10-CM | POA: Diagnosis not present

## 2023-04-19 DIAGNOSIS — E782 Mixed hyperlipidemia: Secondary | ICD-10-CM | POA: Diagnosis not present

## 2023-04-19 DIAGNOSIS — Z7185 Encounter for immunization safety counseling: Secondary | ICD-10-CM | POA: Diagnosis not present

## 2023-05-06 ENCOUNTER — Other Ambulatory Visit: Payer: Self-pay

## 2023-05-06 ENCOUNTER — Encounter (HOSPITAL_COMMUNITY): Payer: Self-pay | Admitting: Occupational Therapy

## 2023-05-06 ENCOUNTER — Ambulatory Visit (HOSPITAL_COMMUNITY): Payer: Medicare Other | Attending: Internal Medicine | Admitting: Occupational Therapy

## 2023-05-06 DIAGNOSIS — R278 Other lack of coordination: Secondary | ICD-10-CM | POA: Diagnosis not present

## 2023-05-06 DIAGNOSIS — R29818 Other symptoms and signs involving the nervous system: Secondary | ICD-10-CM | POA: Diagnosis not present

## 2023-05-06 DIAGNOSIS — I639 Cerebral infarction, unspecified: Secondary | ICD-10-CM | POA: Insufficient documentation

## 2023-05-06 NOTE — Therapy (Unsigned)
OUTPATIENT OCCUPATIONAL THERAPY NEURO EVALUATION  Patient Name: Brandon Martin MRN: [id number] DOB:[birth year], 57 y.o., male Today's Date: 05/08/2023  PCP: Joycelyn Rua, MD REFERRING PROVIDER: Vassie Loll, MD  END OF SESSION:   05/06/23 1057  OT Visits / Re-Eval  Visit Number 1  Number of Visits 1  Date for OT Re-Evaluation 05/09/23  Authorization  Authorization Type [employer] Medicare  OT Time Calculation  OT Start Time 1016  OT Stop Time 1057  OT Time Calculation (min) 41 min  End of Session  Activity Tolerance Patient tolerated treatment well  Behavior During Therapy WFL for tasks assessed/performed    Past Medical History:  Diagnosis Date   Coronary artery disease    GERD (gastroesophageal reflux disease)    Heart attack (HCC)    Hyperlipidemia    Hypertension    Pleural effusion    Stroke (HCC)    Tobacco use disorder    Past Surgical History:  Procedure Laterality Date   BACK SURGERY     CORONARY ARTERY BYPASS GRAFT  x 3   KIDNEY TRANSPLANT Left    KNEE SURGERY Left    6   Patient Active Problem List   Diagnosis Date Noted   Dysarthria 04/13/2023   CVA (cerebral vascular accident) (HCC) 04/11/2023   Nicotine dependence, cigarettes, uncomplicated 02/11/2021   Essential hypertension 02/11/2021   Subacromial impingement of left shoulder 01/21/2017   Right basal ganglia embolic stroke (HCC) 12/07/2016   Basal ganglia infarction (HCC) 12/04/2016   Tobacco use disorder    Coronary artery disease    GERD (gastroesophageal reflux disease)    Mixed hyperlipidemia    Pleural effusion     ONSET DATE: 04/11/23  REFERRING DIAG: CVA with L sided weakness  THERAPY DIAG:  Other lack of coordination  Other symptoms and signs involving the nervous system  Rationale for Evaluation and Treatment: Rehabilitation  SUBJECTIVE:   SUBJECTIVE STATEMENT: "My left side is just weak" Pt accompanied by: self  PERTINENT HISTORY: 57 y.o. male with medical history  significant of coronary artery disease, GERD, hyperlipidemia, hypertension, tobacco use disorder, previous stroke with left-sided deficits.  PRECAUTIONS: Fall  WEIGHT BEARING RESTRICTIONS: No  PAIN:  Are you having pain? No  FALLS: Has patient fallen in last 6 months? Yes. Number of falls 2  LIVING ENVIRONMENT: Lives with: lives with their family Lives in: Mobile home Stairs: No  PLOF: Independent  PATIENT GOALS: "To get this arm better"  OBJECTIVE:  Note: Objective measures were completed at Evaluation unless otherwise noted.  HAND DOMINANCE: Right  ADLs: Overall ADLs: Pt feels that he is independent with all ADL's and IADL's.   MOBILITY STATUS: Independent and Hx of falls  POSTURE COMMENTS:  No Significant postural limitations  FUNCTIONAL OUTCOME MEASURES: FOTO: 57.23  UPPER EXTREMITY ROM:    Active ROM Left eval  Shoulder flexion 86  Shoulder abduction 86  Shoulder internal rotation 90  Shoulder external rotation 67  (Blank rows = not tested)  UPPER EXTREMITY MMT:     MMT Left eval  Shoulder flexion 5/5  Shoulder abduction 4/5  Shoulder internal rotation 5/5  Shoulder external rotation 4-/5  Elbow flexion 5/5  Elbow extension 4+/5  Wrist flexion 5/5  Wrist extension 5/5  Wrist ulnar deviation 5/5  Wrist radial deviation 5/5  Wrist pronation 5/5  Wrist supination 4+/5  (Blank rows = not tested)  HAND FUNCTION: Grip strength: Right: 70 lbs; Left: 74 lbs, Lateral pinch: Right: 17 lbs, Left: 15 lbs, and 3  point pinch: Right: 16 lbs, Left: 15 lbs  COORDINATION: 9 Hole Peg test: Right: 26.89 sec; Left: 35.91 sec  SENSATION: Tingles going down into fingers  EDEMA: No swelling noted  VISION: Subjective report: Near sited Baseline vision: Wears glasses for reading only Visual history:  Eye doctor said nothings wrong with vision.  VISION ASSESSMENT: Not tested  Patient has difficulty with following activities due to following visual  impairments: reading  OBSERVATIONS: No tone noted in LUE.    TODAY'S TREATMENT:                                                                                                                              DATE: 05/06/23: Evaluation Only   PATIENT EDUCATION: Education details: Use LUE for all functional tasks Person educated: Patient Education method: Explanation Education comprehension: verbalized understanding  HOME EXERCISE PROGRAM: Use LUE For all functional tasks   ASSESSMENT:  CLINICAL IMPRESSION: Patient is a 57 y.o. male who was seen today for occupational therapy evaluation for s/p CVA with L sided weakness. At this time pt feels that he is mainly at his baseline and requiring no assist for all ADL's and IADL's. Pt feels he does not need OT at this time, however will follow up if needs change.   PERFORMANCE DEFICITS: in functional skills including coordination and strength.  IMPAIRMENTS: are limiting patient from work.   CO-MORBIDITIES: has no other co-morbidities that affects occupational performance. Patient will benefit from skilled OT to address above impairments and improve overall function.  MODIFICATION OR ASSISTANCE TO COMPLETE EVALUATION: No modification of tasks or assist necessary to complete an evaluation.  OT OCCUPATIONAL PROFILE AND HISTORY: Problem focused assessment: Including review of records relating to presenting problem.  CLINICAL DECISION MAKING: LOW - limited treatment options, no task modification necessary  REHAB POTENTIAL: Good  EVALUATION COMPLEXITY: Low    PLAN:  OT FREQUENCY: one time visit  OT DURATION: other: Evaluation Only  PLANNED INTERVENTIONS: 97168 OT Re-evaluation  RECOMMENDED OTHER SERVICES: N/A  CONSULTED AND AGREED WITH PLAN OF CARE: Patient  PLAN FOR NEXT SESSION: Evaluation Only   Trish Mage, OTR/L [facility] [phone] Marciel Offenberger Rosemarie Beath, OT 05/08/2023, 2:12 PM

## 2023-06-17 DIAGNOSIS — S60221A Contusion of right hand, initial encounter: Secondary | ICD-10-CM | POA: Diagnosis not present

## 2023-06-17 DIAGNOSIS — G459 Transient cerebral ischemic attack, unspecified: Secondary | ICD-10-CM | POA: Diagnosis not present

## 2023-06-20 DIAGNOSIS — S62640A Nondisplaced fracture of proximal phalanx of right index finger, initial encounter for closed fracture: Secondary | ICD-10-CM | POA: Diagnosis not present

## 2023-06-28 DIAGNOSIS — M545 Low back pain, unspecified: Secondary | ICD-10-CM | POA: Diagnosis not present

## 2023-07-05 ENCOUNTER — Other Ambulatory Visit: Payer: Self-pay | Admitting: Cardiology

## 2023-07-12 ENCOUNTER — Other Ambulatory Visit: Payer: Self-pay | Admitting: Cardiology

## 2023-07-14 ENCOUNTER — Other Ambulatory Visit: Payer: Self-pay | Admitting: Cardiology

## 2023-07-19 DIAGNOSIS — D126 Benign neoplasm of colon, unspecified: Secondary | ICD-10-CM | POA: Diagnosis not present

## 2023-07-19 DIAGNOSIS — I10 Essential (primary) hypertension: Secondary | ICD-10-CM | POA: Diagnosis not present

## 2023-07-19 DIAGNOSIS — I63511 Cerebral infarction due to unspecified occlusion or stenosis of right middle cerebral artery: Secondary | ICD-10-CM | POA: Diagnosis not present

## 2023-07-19 DIAGNOSIS — J432 Centrilobular emphysema: Secondary | ICD-10-CM | POA: Diagnosis not present

## 2023-07-19 DIAGNOSIS — E1169 Type 2 diabetes mellitus with other specified complication: Secondary | ICD-10-CM | POA: Diagnosis not present

## 2023-07-19 DIAGNOSIS — E782 Mixed hyperlipidemia: Secondary | ICD-10-CM | POA: Diagnosis not present

## 2023-08-17 DIAGNOSIS — M546 Pain in thoracic spine: Secondary | ICD-10-CM | POA: Diagnosis not present

## 2023-08-24 ENCOUNTER — Ambulatory Visit: Payer: Medicare Other | Admitting: Cardiology

## 2023-09-07 ENCOUNTER — Ambulatory Visit (HOSPITAL_COMMUNITY): Payer: Medicare Other

## 2023-09-28 ENCOUNTER — Ambulatory Visit (HOSPITAL_COMMUNITY): Payer: Medicare Other | Admitting: Physical Therapy

## 2023-10-03 ENCOUNTER — Ambulatory Visit: Payer: Medicare Other | Admitting: Cardiology

## 2023-10-19 ENCOUNTER — Ambulatory Visit (HOSPITAL_COMMUNITY)

## 2023-10-19 ENCOUNTER — Telehealth (HOSPITAL_COMMUNITY): Payer: Self-pay

## 2023-10-19 NOTE — Telephone Encounter (Signed)
 No show #1 for Initial Evaluation Called and left message on patient voicemail. Gave front desk phone number to call back and reschedule initial evaluation. Given information on cancellation/no show policy.   2:06 PM, 10/19/23 Chryl Heck, PT, DPT [facility] [place]

## 2023-10-19 NOTE — Therapy (Deleted)
 OUTPATIENT PHYSICAL THERAPY THORACOLUMBAR EVALUATION   Patient Name: Brandon Martin MRN: [id number] DOB:10-03-1965, 58 y.o., male Today's Date: 10/19/2023  END OF SESSION:   Past Medical History:  Diagnosis Date   Coronary artery disease    GERD (gastroesophageal reflux disease)    Heart attack (HCC)    Hyperlipidemia    Hypertension    Pleural effusion    Stroke (HCC)    Tobacco use disorder    Past Surgical History:  Procedure Laterality Date   BACK SURGERY     CORONARY ARTERY BYPASS GRAFT  x 3   KIDNEY TRANSPLANT Left    KNEE SURGERY Left    6   Patient Active Problem List   Diagnosis Date Noted   Dysarthria 04/13/2023   CVA (cerebral vascular accident) (HCC) 04/11/2023   Nicotine dependence, cigarettes, uncomplicated 02/11/2021   Essential hypertension 02/11/2021   Subacromial impingement of left shoulder 01/21/2017   Right basal ganglia embolic stroke (HCC) 12/07/2016   Basal ganglia infarction (HCC) 12/04/2016   Tobacco use disorder    Coronary artery disease    GERD (gastroesophageal reflux disease)    Mixed hyperlipidemia    Pleural effusion     PCP: Joycelyn Rua, MD  REFERRING PROVIDER: Dawley, Alan Mulder, DO  REFERRING DIAG: lower thoracic back pain  Rationale for Evaluation and Treatment: Rehabilitation  THERAPY DIAG:  No diagnosis found.  ONSET DATE: ***  SUBJECTIVE:                                                                                                                                                                                           SUBJECTIVE STATEMENT: ***  PERTINENT HISTORY:  ***  PAIN:  Are you having pain? {OPRCPAIN:27236}  PRECAUTIONS: None  RED FLAGS: {PT Red Flags:29287}   WEIGHT BEARING RESTRICTIONS: {Yes ***/No:24003}  FALLS:  Has patient fallen in last 6 months? {fallsyesno:27318}  LIVING ENVIRONMENT: Lives with: {OPRC lives with:25569::"lives with their family"} Lives in: {Lives in:25570} Stairs:  {opstairs:27293} Has following equipment at home: {Assistive devices:23999}  OCCUPATION: ***  PLOF: {PLOF:24004}  PATIENT GOALS: ***  NEXT MD VISIT: ***  OBJECTIVE:  Note: Objective measures were completed at Evaluation unless otherwise noted.  DIAGNOSTIC FINDINGS:  ***  PATIENT SURVEYS:  Modified Oswestry ***   COGNITION: Overall cognitive status: {cognition:24006}     SENSATION: {sensation:27233}  MUSCLE LENGTH: Hamstrings: Right *** deg; Left *** deg Maisie Fus test: Right *** deg; Left *** deg  POSTURE: {posture:25561}  PALPATION: ***  LUMBAR ROM:   AROM eval  Flexion   Extension   Right lateral flexion   Left lateral flexion   Right  rotation   Left rotation    (Blank rows = not tested)  LOWER EXTREMITY ROM:     {AROM/PROM:27142}  Right eval Left eval  Hip flexion    Hip extension    Hip abduction    Hip adduction    Hip internal rotation    Hip external rotation    Knee flexion    Knee extension    Ankle dorsiflexion    Ankle plantarflexion    Ankle inversion    Ankle eversion     (Blank rows = not tested)  LOWER EXTREMITY MMT:    MMT Right eval Left eval  Hip flexion    Hip extension    Hip abduction    Hip adduction    Hip internal rotation    Hip external rotation    Knee flexion    Knee extension    Ankle dorsiflexion    Ankle plantarflexion    Ankle inversion    Ankle eversion     (Blank rows = not tested)  LUMBAR SPECIAL TESTS:  {lumbar special test:25242}  FUNCTIONAL TESTS:  {Functional tests:24029}  GAIT: Distance walked: *** Assistive device utilized: {Assistive devices:23999} Level of assistance: {Levels of assistance:24026} Comments: ***  TREATMENT DATE:  10/19/23: PT Eval and HEP                                                                                                                                 PATIENT EDUCATION:  Education details: *** Person educated: {Person educated:25204} Education  method: {Education Method:25205} Education comprehension: {Education Comprehension:25206}  HOME EXERCISE PROGRAM: ***  ASSESSMENT:  CLINICAL IMPRESSION: Patient is a 58 y.o. male who was seen today for physical therapy evaluation and treatment for lower thoracic back pain.   OBJECTIVE IMPAIRMENTS: {opptimpairments:25111}.   ACTIVITY LIMITATIONS: {activitylimitations:27494}  PARTICIPATION LIMITATIONS: {participationrestrictions:25113}  PERSONAL FACTORS: {Personal factors:25162} are also affecting patient's functional outcome.   REHAB POTENTIAL: {rehabpotential:25112}  CLINICAL DECISION MAKING: {clinical decision making:25114}  EVALUATION COMPLEXITY: {Evaluation complexity:25115}   GOALS: Goals reviewed with patient? {yes/no:20286}  SHORT TERM GOALS: Target date: ***  Patient will be independent with performance of HEP to demonstrate adequate self management of symptoms.  Baseline:  Goal status: INITIAL  2.   Patient will report at least a 25% improvement with function or pain overall since beginning PT. Baseline:  Goal status: INITIAL  LONG TERM GOALS: Target date: ***  Patient will improve Oswestry score by     points in order to improve self-perceived disability and overall function.  Baseline: Goal status: INITIAL   2.  Patient will improve     score by     in order to   .  Baseline: Goal status: INITIAL   3.  Patient will improve    test to   in order to improve LE strength and endurance to return to  . Baseline:  Goal status: INITIAL  4.  Patient will gain at  least    deg of AROM in     in order to improve foot clearance for safe gait mechanics Baseline:  Goal status: INITIAL   5.  Patient will report overall 50% improvement since beginning PT. Baseline:  Goal status: INITIAL   PLAN:  PT FREQUENCY: {rehab frequency:25116}  PT DURATION: {rehab duration:25117}  PLANNED INTERVENTIONS: {rehab planned interventions:25118::"97110-Therapeutic  exercises","97530- Therapeutic [phone]- Neuromuscular re-education","97535- Self JXBJ","47829- Manual therapy"}.  PLAN FOR NEXT SESSION: ***   9:06 AM, 10/19/23 Chryl Heck, PT, DPT [facility] Medicare Auth Request Information  Date of referral: 10/19/23 Referring provider: Dawley, Alan Mulder, DO Referring diagnosis (ICD 10)? *** Treatment diagnosis (ICD 10)? (if different than referring diagnosis) ***  Functional Tool Score: ***  What was this (referring dx) caused by? {Cause of Referring Diagnosis:30897}  Ashby Dawes of Condition: {Nature of Condition:30889}   Laterality: {Laterality:30890}  Current Functional Measure Score: {Functional Measure Score:30891}  Objective measurements identify impairments when they are compared to normal values, the uninvolved extremity, and prior level of function.  []  Yes  []  No  Objective assessment of functional ability: {assessment of functional ability:30892}   Briefly describe symptoms: ***  How did symptoms start: ***  Average pain intensity:  Last 24 hours: ***  Past week: ***  How often does the pt experience symptoms? {Frequency of symptoms:30893}  How much have the symptoms interfered with usual daily activities? {Interfere with FAOZ:30865}  How has condition changed since care began at this facility? {Condition Changed:30895}  In general, how is the patients overall health? {Overall Health:30896}   BACK PAIN (STarT Back Screening Tool) {BACK HQIO:96295}

## 2023-11-11 NOTE — Therapy (Unsigned)
 OUTPATIENT PHYSICAL THERAPY THORACOLUMBAR EVALUATION   Patient Name: Brandon Martin MRN: [id number] DOB:15-May-1966, 58 y.o., male Today's Date: 11/14/2023  END OF SESSION:  PT End of Session - 11/14/23 0931     Visit Number 1    Number of Visits 6    Date for PT Re-Evaluation 12/12/23    Authorization Type [employer] Medicare    Authorization Time Period Auth requested    PT Start Time 0934    PT Stop Time 1015    PT Time Calculation (min) 41 min    Activity Tolerance Patient tolerated treatment well    Behavior During Therapy WFL for tasks assessed/performed             Past Medical History:  Diagnosis Date   Coronary artery disease    GERD (gastroesophageal reflux disease)    Heart attack (HCC)    Hyperlipidemia    Hypertension    Pleural effusion    Stroke (HCC)    Tobacco use disorder    Past Surgical History:  Procedure Laterality Date   BACK SURGERY     CORONARY ARTERY BYPASS GRAFT  x 3   KIDNEY TRANSPLANT Left    KNEE SURGERY Left    6   Patient Active Problem List   Diagnosis Date Noted   Dysarthria 04/13/2023   CVA (cerebral vascular accident) (HCC) 04/11/2023   Nicotine  dependence, cigarettes, uncomplicated 02/11/2021   Essential hypertension 02/11/2021   Subacromial impingement of left shoulder 01/21/2017   Right basal ganglia embolic stroke (HCC) 12/07/2016   Basal ganglia infarction (HCC) 12/04/2016   Tobacco use disorder    Coronary artery disease    GERD (gastroesophageal reflux disease)    Mixed hyperlipidemia    Pleural effusion     PCP: Brandon Heater, MD  REFERRING PROVIDER: Dawley, Colby Daub, DO  REFERRING DIAG:  lower thoracic back pain    Rationale for Evaluation and Treatment: Rehabilitation  THERAPY DIAG:  Other low back pain  Muscle weakness (generalized)  Decreased ROM of lumbar spine  ONSET DATE: 1999  SUBJECTIVE:                                                                                                                                                                                            SUBJECTIVE STATEMENT: Reports he had a back surgery in 1999 after he [name] off a ladder and broke vertebrae 11 and 12. Had rehab in the hospital and did some therapy after that. Reports current pain as numbness in low back. Worse on the Left. Being on his feet and bending over hurts it the most. Travels to  [place] intermittently.  PERTINENT HISTORY:  Back surgery 1999 6 surgeries on L knee, needs knee replacements  PAIN:  Are you having pain? Yes: NPRS scale: 8/9 Pain location: L low back  Pain description: numb Aggravating factors: Bending forward, Standing too long Relieving factors: N/A, always in pain  PRECAUTIONS: None  RED FLAGS: None   WEIGHT BEARING RESTRICTIONS: No  FALLS:  Has patient fallen in last 6 months? Yes. Number of falls 1; a couple weeks ago, lost balance when he tripped on back deck.   OCCUPATION: Disability  PLOF: Independent  PATIENT GOALS: "To get rid of pain"  NEXT MD VISIT: N/A  OBJECTIVE:  Note: Objective measures were completed at Evaluation unless otherwise noted.  DIAGNOSTIC FINDINGS:    PATIENT SURVEYS:  Modified Oswestry Low Back Pain Disability Questionnaire: 27 / 50 = 54.0 %  COGNITION: Overall cognitive status: Within functional limits for tasks assessed     SENSATION: Light touch: Impaired  Dec sens on R L2 and L5 dermatomes   POSTURE: rounded shoulders and forward head  PALPATION: TTP t/o lumbar spine and L sided paraspinal musculature  LUMBAR ROM:   AROM eval  Flexion 25%, shooting pain  Extension Barely any, worse than forward bending, shooting pain  Right lateral flexion Right above knee  Left lateral flexion Barely any, Shooting pain    Right rotation   Left rotation    (Blank rows = not tested)  LOWER EXTREMITY ROM:     Active  Right eval Left eval  Hip flexion    Hip extension    Hip abduction    Hip adduction    Hip internal  rotation    Hip external rotation    Knee flexion    Knee extension    Ankle dorsiflexion    Ankle plantarflexion    Ankle inversion    Ankle eversion     (Blank rows = not tested)  LOWER EXTREMITY MMT:    MMT Right eval Left eval  Hip flexion 4- 4-  Hip extension    Hip abduction    Hip adduction    Hip internal rotation    Hip external rotation    Knee flexion 3+ 3+  Knee extension 4- 4- Grinding/ popping, Cramping  Ankle dorsiflexion 5 5  Ankle plantarflexion    Ankle inversion    Ankle eversion     (Blank rows = not tested)  LUMBAR SPECIAL TESTS:   FUNCTIONAL TESTS:  5 times sit to stand: 25", Inc pain in low back and LLE  GAIT: Distance walked: 100 Assistive device utilized: None Level of assistance: Complete Independence Comments: Dec velocity, dec step lengths bilaterally, mild shuffling/dec foot clearance (L>R 2/2 L hemiparesis from previous CVA), forward lean, rounded shoulders  TREATMENT DATE:  11/14/23: PT Eval and HEP  PATIENT EDUCATION:  Education details: PT evaluation, objective findings, POC, Importance of HEP, Precautions, Clinic policies  Person educated: Patient Education method: Explanation and Demonstration Education comprehension: verbalized understanding and returned demonstration  HOME EXERCISE PROGRAM: Access Code: [email or web address] URL: [email or web address] Date: 11/14/2023 Prepared by: Virgia Griffins Powell-Butler  Exercises - Hooklying Single Knee to Chest Stretch with Towel  - 2 x daily - 7 x weekly - 2 sets - 10 reps - Supine Lower Trunk Rotation  - 2 x daily - 7 x weekly - 2 sets - 10 reps - Pelvic Tilt  - 2 x daily - 7 x weekly - 2 sets - 10 reps  ASSESSMENT:  CLINICAL IMPRESSION: Patient is a 58 y.o. male who was seen today for physical therapy evaluation and treatment for  lower thoracic back pain   . Patient with relevant history of previous back surgery and CVA. Patient reports to PT Evaluation with reports of low back pain, that sometimes radiates to L glute/hip region, down LE. Reports main pain as numbness but reports sometimes as sharp/shooting pain. Patient demonstrates dec lumbar ROM, dec LE strength (L>R), poor posture, and tenderness in paraspinal musculature which all may be contributing to patient pain. Patient reports he assists in taking care of his mother as well. Patient will benefit from skilled physical therapy in order to address the above in order to improve function and QOL. Patient interested in Dry needling. Given paperwork. Will see dry needling specialist next session to assess need.    OBJECTIVE IMPAIRMENTS: Abnormal gait, decreased activity tolerance, decreased balance, decreased coordination, decreased endurance, decreased mobility, difficulty walking, decreased ROM, decreased strength, hypomobility, increased muscle spasms, impaired sensation, and pain.   ACTIVITY LIMITATIONS: carrying, lifting, bending, sitting, standing, squatting, sleeping, stairs, transfers, and bed mobility  PARTICIPATION LIMITATIONS: cleaning, laundry, driving, shopping, community activity, and yard work  PERSONAL FACTORS: Time since onset of injury/illness/exacerbation are also affecting patient's functional outcome.   REHAB POTENTIAL: Good  CLINICAL DECISION MAKING: Stable/uncomplicated  EVALUATION COMPLEXITY: Low   GOALS: Goals reviewed with patient? No  SHORT TERM GOALS: Target date: 11/28/23  Patient will be independent with performance of HEP to demonstrate adequate self management of symptoms.  Baseline:  Goal status: INITIAL  2.   Patient will report at least a 25% improvement with function or pain overall since beginning PT. Baseline:  Goal status: INITIAL   LONG TERM GOALS: Target date: 12/26/23 Patient will improve Oswestry score by  10   points in order to improve  self-perceived disability and overall function.  Baseline: Goal status: INITIAL   2.  Patient will gain at least 25% of AROM in  lumbar extension with dec pain rating, 3/10 or lower, in order to demonstrate improved lumbar ROM for functional tasks at home such as putting things in taller cabinets.  Baseline:  Goal status: INITIAL  3. Patient will improve 5xSTS score to 20 seconds or less in order to demonstrate improved LE strength/power for improved gait mechanics.  Baseline: Goal status: INITIAL   4.  Patient will report overall 50% improvement since beginning PT. Baseline:  Goal status: INITIAL   PLAN:  PT FREQUENCY: 1-2x/week  PT DURATION: 6 weeks  PLANNED INTERVENTIONS: 97164- PT Re-evaluation, 97110-Therapeutic exercises, 97530- Therapeutic activity, 97112- Neuromuscular re-education, 97535- Self Care, 54098- Manual therapy, [phone]- Gait training, Patient/Family education, Balance training, Stair training, Dry Needling, Joint mobilization, Spinal mobilization, and Moist heat.  PLAN FOR NEXT SESSION: Possible dry needling, progress lumbar mobility as tolerated, LE strengthening, core strengthening  1:42 PM, 11/14/23 Marysue Sola, PT, DPT [facility] Medicare Auth Request Information  Date of referral: 08/18/23 Referring provider: Dawley, Colby Daub, DO Referring diagnosis (ICD 10)?  lower thoracic back pain   Treatment diagnosis (ICD 10)? (if different than referring diagnosis)  M54.59 M62.81 M53.86  Functional Tool Score:  5 times sit to stand: 25" Modified Oswestry Low Back Pain Disability Questionnaire: 27 / 50 = 54.0 %  What was this (referring dx) caused by? Ongoing Issue  Lonne Roan of Condition: Chronic (continuous duration > 3 months)   Laterality: Both  Current Functional Measure Score:  Other Modified Oswestry Low Back Pain Disability Questionnaire: 27 / 50 = 54.0 %  Objective measurements identify impairments  when they are compared to normal values, the uninvolved extremity, and prior level of function.  [x]  Yes  []  No  Objective assessment of functional ability: Moderate functional limitations   Briefly describe symptoms: See above  How did symptoms start: See above  Average pain intensity:  Last 24 hours: 8-9/10  Past week: 8-9/10  How often does the pt experience symptoms? Constantly  How much have the symptoms interfered with usual daily activities? Quite a bit  How has condition changed since care began at this facility? Worse  In general, how is the patients overall health? Fair   BACK PAIN (STarT Back Screening Tool) No

## 2023-11-14 ENCOUNTER — Ambulatory Visit (HOSPITAL_COMMUNITY): Attending: Neurological Surgery

## 2023-11-14 ENCOUNTER — Encounter (HOSPITAL_COMMUNITY): Payer: Self-pay

## 2023-11-14 ENCOUNTER — Other Ambulatory Visit: Payer: Self-pay

## 2023-11-14 DIAGNOSIS — M5459 Other low back pain: Secondary | ICD-10-CM | POA: Insufficient documentation

## 2023-11-14 DIAGNOSIS — M6281 Muscle weakness (generalized): Secondary | ICD-10-CM | POA: Diagnosis not present

## 2023-11-14 DIAGNOSIS — M5386 Other specified dorsopathies, lumbar region: Secondary | ICD-10-CM | POA: Diagnosis not present

## 2023-11-14 NOTE — Patient Instructions (Signed)

## 2023-11-17 ENCOUNTER — Encounter: Payer: Self-pay | Admitting: Cardiology

## 2023-11-17 ENCOUNTER — Ambulatory Visit: Attending: Cardiology | Admitting: Cardiology

## 2023-11-17 VITALS — BP 126/74 | HR 66 | Resp 16 | Ht 68.0 in | Wt 196.8 lb

## 2023-11-17 DIAGNOSIS — E782 Mixed hyperlipidemia: Secondary | ICD-10-CM

## 2023-11-17 DIAGNOSIS — I10 Essential (primary) hypertension: Secondary | ICD-10-CM | POA: Diagnosis not present

## 2023-11-17 DIAGNOSIS — I2510 Atherosclerotic heart disease of native coronary artery without angina pectoris: Secondary | ICD-10-CM | POA: Diagnosis not present

## 2023-11-17 DIAGNOSIS — F17210 Nicotine dependence, cigarettes, uncomplicated: Secondary | ICD-10-CM

## 2023-11-17 NOTE — Progress Notes (Signed)
 Cardiology Office Note:  .   Date:  11/17/2023  ID:  Brandon Martin, DOB Jul 13, 1965, MRN [id number] PCP: Wyn Heater, MD  [facility] Providers Cardiologist:  Fransico Ivy, MD PCP: Wyn Heater, MD  Chief Complaint  Patient presents with   Coronary artery disease involving native coronary artery of   Follow-up     Brandon Martin is a 59 y.o. male with hypertension, hyperlipidemia, type 2 diabetes mellitus, coronary artery disease s/p CABGX3 2012 (LIMA-LAD, lt radial artery-PDA, SVG-OM1), nicotine  dependence, prior stroke  History of Present Illness  Patient is here today with his brother.  He denies any chest pain or shortness of breath.  He was in the hospital few months ago with dysarthria symptoms, without any evidence of new stroke on brain MRI.  Unfortunately, he continues to smoke and not ready to quit.   Vitals:   11/17/23 1153  BP: 126/74  Pulse: 66  Resp: 16  SpO2: 96%      Review of Systems  Cardiovascular:  Negative for chest pain, dyspnea on exertion, leg swelling, palpitations and syncope.        Studies Reviewed: Brandon Martin         Independently interpreted 07/2023: Chol 109, TG 157, HDL 35, LDL 47 HbA1C 6.0% Hb 15.4 Cr 1.1     Echocardiogram 04/2023:  1. Left ventricular ejection fraction, by estimation, is 50 to 55%. The  left ventricle has low normal function. The left ventricle demonstrates  regional wall motion abnormalities (see scoring diagram/findings for  description). There is moderate  asymmetric left ventricular hypertrophy of the basal segment. Left  ventricular diastolic parameters are consistent with Grade I diastolic  dysfunction (impaired relaxation).   2. Right ventricular systolic function is normal. The right ventricular  size is normal. Tricuspid regurgitation signal is inadequate for assessing  PA pressure.   3. The mitral valve is grossly normal. Trivial mitral valve  regurgitation.   4. The aortic valve is  tricuspid. Aortic valve regurgitation is not  visualized. Aortic valve sclerosis is present, with no evidence of aortic  valve stenosis.   5. Aortic dilatation noted. There is mild dilatation of the aortic root,  measuring 40 mm.   6. The inferior vena cava is dilated in size with >50% respiratory  variability, suggesting right atrial pressure of 8 mmHg.   MRI brain 03/2023: 1. No acute intracranial abnormality. 2. Findings of chronic microvascular ischemia and old right basal ganglia small vessel infarct.     Physical Exam Vitals and nursing note reviewed.  Constitutional:      General: He is not in acute distress. Neck:     Vascular: No JVD.  Cardiovascular:     Rate and Rhythm: Normal rate and regular rhythm.     Heart sounds: Normal heart sounds. No murmur heard. Pulmonary:     Effort: Pulmonary effort is normal.     Breath sounds: Normal breath sounds. No wheezing or rales.  Musculoskeletal:     Right lower leg: No edema.     Left lower leg: No edema.      VISIT DIAGNOSES:   ICD-10-CM   1. Coronary artery disease involving native coronary artery of native heart without angina pectoris  I25.10 CANCELED: EKG 12-Lead    2. Essential hypertension  I10     3. Mixed hyperlipidemia  E78.2     4. Cigarette nicotine  dependence without complication  F17.210        Brandon Martin is a  58 y.o. male with hypertension, hyperlipidemia, type 2 diabetes mellitus, coronary artery disease s/p CABGX3 2012 (LIMA-LAD, lt radial artery-PDA, SVG-OM1), nicotine  dependence   Assessment & Plan  CAD: S/p CABGX3 (2012) No recent angina episodes. No ischemic on stress test (02/2021) Started on aspirin  and Plavix  after hospitalization with dysarthria, without any evidence of new stroke (04/2023). I do not think he needs DAPT at this point.  Discontinue aspirin , continue lifelong Plavix  75 mg daily. LDL 47 on Repatha , continue the same. Continue metoprolol  succinate for CAD as well as  hypertension management.   Hypertension: Controlled.  Mixed hyperlipidemia: As above   Nicotine  dependence: Tobacco cessation counseling:  - Currently smoking 1/2 packs/day   - Patient was informed of the dangers of tobacco abuse including stroke, cancer, and MI, as well as benefits of tobacco cessation. - Patient is NOT willing to quit at this time. - Approximately 5 mins were spent counseling patient cessation techniques. We discussed various methods to help quit smoking, including deciding on a date to quit, joining a support group, pharmacological agents. Patient would like to not quit smoking at this time. - I will reassess his progress at the next follow-up visit   F/u in 1 year  Signed, Brandon Das, MD

## 2023-11-17 NOTE — Patient Instructions (Signed)
 Medication Instructions:  STOP Aspirin  81 mg   *If you need a refill on your cardiac medications before your next appointment, please call your pharmacy*  Your next appointment:   1 year(s)  Provider:   Cody Das, MD    We recommend signing up for the patient portal called "MyChart".  Sign up information is provided on this After Visit Summary.  MyChart is used to connect with patients for Virtual Visits (Telemedicine).  Patients are able to view lab/test results, encounter notes, upcoming appointments, etc.  Non-urgent messages can be sent to your provider as well.   To learn more about what you can do with MyChart, go to [email or web address].

## 2023-11-21 ENCOUNTER — Encounter (HOSPITAL_COMMUNITY)

## 2023-12-16 ENCOUNTER — Encounter (HOSPITAL_COMMUNITY)

## 2023-12-20 ENCOUNTER — Ambulatory Visit (HOSPITAL_COMMUNITY): Attending: Neurological Surgery

## 2023-12-20 DIAGNOSIS — M5386 Other specified dorsopathies, lumbar region: Secondary | ICD-10-CM | POA: Insufficient documentation

## 2023-12-20 DIAGNOSIS — M5459 Other low back pain: Secondary | ICD-10-CM | POA: Diagnosis not present

## 2023-12-20 DIAGNOSIS — M6281 Muscle weakness (generalized): Secondary | ICD-10-CM | POA: Insufficient documentation

## 2023-12-20 NOTE — Therapy (Signed)
 OUTPATIENT PHYSICAL THERAPY THORACOLUMBAR EVALUATION   Patient Name: Brandon Martin MRN: [id number] DOB:[birth year], 58 y.o., male Today's Date: 12/20/2023  END OF SESSION:  PT End of Session - 12/20/23 1007     Visit Number 2    Number of Visits 6    Date for PT Re-Evaluation 12/12/23    Authorization Type [employer] Medicare    Authorization Time Period 6 visits from 5/5 to 6/30    PT Start Time 1006    PT Stop Time 1046    PT Time Calculation (min) 40 min    Activity Tolerance Patient tolerated treatment well    Behavior During Therapy [facility] for tasks assessed/performed             Past Medical History:  Diagnosis Date   Coronary artery disease    GERD (gastroesophageal reflux disease)    Heart attack (HCC)    Hyperlipidemia    Hypertension    Pleural effusion    Stroke (HCC)    Tobacco use disorder    Past Surgical History:  Procedure Laterality Date   BACK SURGERY     CORONARY ARTERY BYPASS GRAFT  x 3   KIDNEY TRANSPLANT Left    KNEE SURGERY Left    6   Patient Active Problem List   Diagnosis Date Noted   Dysarthria 04/13/2023   CVA (cerebral vascular accident) (HCC) 04/11/2023   Cigarette nicotine  dependence without complication 02/11/2021   Essential hypertension 02/11/2021   Subacromial impingement of left shoulder 01/21/2017   Right basal ganglia embolic stroke (HCC) 12/07/2016   Basal ganglia infarction (HCC) 12/04/2016   Tobacco use disorder    Coronary artery disease    GERD (gastroesophageal reflux disease)    Mixed hyperlipidemia    Pleural effusion     PCP: Brandon Heater, MD  REFERRING PROVIDER: Dawley, Colby Daub, Martin  REFERRING DIAG:  lower thoracic back pain    Rationale for Evaluation and Treatment: Rehabilitation  THERAPY DIAG:  Other low back pain  Muscle weakness (generalized)  Decreased ROM of lumbar spine  ONSET DATE: 1999  SUBJECTIVE:                                                                                                                                                                                            SUBJECTIVE STATEMENT: Patient reports 7/10 pain today in his back. He is not interested in dry needling due to the out of pocket expense.  His HEP exercises are "not worth a hoot".  Has not seen his back MD in about 25 years per his report. "These nerves don't work right".  Also states he needs both knees and shoulders replaced but can't afford it right now.      Eval:Reports he had a back surgery in 1999 after he Brandon Martin off a ladder and broke vertebrae 11 and 12. Had rehab in the hospital and did some therapy after that. Reports current pain as numbness in low back. Worse on the Left. Being on his feet and bending over hurts it the most. Travels to [place] intermittently.  PERTINENT HISTORY:  Back surgery 1999 6 surgeries on L knee, needs knee replacements  PAIN:  Are you having pain? Yes: NPRS scale: 8/9 Pain location: L low back  Pain description: numb Aggravating factors: Bending forward, Standing too long Relieving factors: N/A, always in pain  PRECAUTIONS: None  RED FLAGS: None   WEIGHT BEARING RESTRICTIONS: No  FALLS:  Has patient fallen in last 6 months? Yes. Number of falls 1; a couple weeks ago, lost balance when he tripped on back deck.   OCCUPATION: Disability  PLOF: Independent  PATIENT GOALS: "To get rid of pain"  NEXT MD VISIT: N/A  OBJECTIVE:  Note: Objective measures were completed at Evaluation unless otherwise noted.  DIAGNOSTIC FINDINGS:    PATIENT SURVEYS:  Modified Oswestry Low Back Pain Disability Questionnaire: 27 / 50 = 54.0 %  COGNITION: Overall cognitive status: Within functional limits for tasks assessed     SENSATION: Light touch: Impaired  Dec sens on R L2 and L5 dermatomes   POSTURE: rounded shoulders and forward head  PALPATION: TTP t/o lumbar spine and L sided paraspinal musculature  LUMBAR ROM:   AROM eval  Flexion 25%, shooting pain   Extension Barely any, worse than forward bending, shooting pain  Right lateral flexion Right above knee  Left lateral flexion Barely any, Shooting pain    Right rotation   Left rotation    (Blank rows = not tested)  LOWER EXTREMITY ROM:     Active  Right eval Left eval  Hip flexion    Hip extension    Hip abduction    Hip adduction    Hip internal rotation    Hip external rotation    Knee flexion    Knee extension    Ankle dorsiflexion    Ankle plantarflexion    Ankle inversion    Ankle eversion     (Blank rows = not tested)  LOWER EXTREMITY MMT:    MMT Right eval Left eval  Hip flexion 4- 4-  Hip extension    Hip abduction    Hip adduction    Hip internal rotation    Hip external rotation    Knee flexion 3+ 3+  Knee extension 4- 4- Grinding/ popping, Cramping  Ankle dorsiflexion 5 5  Ankle plantarflexion    Ankle inversion    Ankle eversion     (Blank rows = not tested)  LUMBAR SPECIAL TESTS:   FUNCTIONAL TESTS:  5 times sit to stand: 25", Inc pain in low back and LLE  GAIT: Distance walked: 100 Assistive device utilized: None Level of assistance: Complete Independence Comments: Dec velocity, dec step lengths bilaterally, mild shuffling/dec foot clearance (L>R 2/2 L hemiparesis from previous CVA), forward lean, rounded shoulders  TREATMENT DATE:  12/20/23 Supine: LTR (causes increased pain) so discontinued Patient gets up and cannot tolerate supine position Nustep seat 8 x 5' (discontinued at 2:40 sec due to knee pain) Discussion of pain management with patient; aquatic exercise,  Seated Thoracic extension x 10 Cervical rotation, flexion and extension  and sidebending x 10 each Red Theraband hip flexion 2 x 10 Red theraband hip abduction 2 x 10 Heel/toe raises x 10 Hamstring stretch 5 x 10" Updated HEP and issued red theraband      11/14/23: PT Eval and HEP                                                                                                                                  PATIENT EDUCATION:  Education details: PT evaluation, objective findings, POC, Importance of HEP, Precautions, Clinic policies  Person educated: Patient Education method: Explanation and Demonstration Education comprehension: verbalized understanding and returned demonstration  HOME EXERCISE PROGRAM: Access Code: [email or web address] URL: [email or web address] Date: 11/14/2023 Prepared by: Virgia Griffins Powell-Butler  Exercises - Hooklying Single Knee to Chest Stretch with Towel  - 2 x daily - 7 x weekly - 2 sets - 10 reps - Supine Lower Trunk Rotation  - 2 x daily - 7 x weekly - 2 sets - 10 reps - Pelvic Tilt  - 2 x daily - 7 x weekly - 2 sets - 10 reps  ASSESSMENT:  CLINICAL IMPRESSION: 12/20/23 Patient declines dry needling after he finds out it is an out of pocket expense.  Review of HEP and goals with patient stating he feels goals are unattainable.  Patient does not tolerate supine positioning today so tried to focus on positions and movements that were tolerable for him. Progressed HEP and issued theraband for home use.  Patient makes the comment that he likely will not be coming back despite encouragement that movement and exercise are beneficial for chronic pain.  Patient will benefit from continued skilled therapy services to address deficits and promote return to optimal function.       Patient is a 58 y.o. male who was seen today for physical therapy evaluation and treatment for  lower thoracic back pain  . Patient with relevant history of previous back surgery and CVA. Patient reports to PT Evaluation with reports of low back pain, that sometimes radiates to L glute/hip region, down LE. Reports main pain as numbness but reports sometimes as sharp/shooting pain. Patient demonstrates dec lumbar ROM, dec LE strength (L>R), poor posture, and tenderness in paraspinal musculature which all may be contributing to patient pain. Patient reports he  assists in taking care of his mother as well. Patient will benefit from skilled physical therapy in order to address the above in order to improve function and QOL. Patient interested in Dry needling. Given paperwork. Will see dry needling specialist next session to assess need.    OBJECTIVE IMPAIRMENTS: Abnormal gait, decreased activity tolerance, decreased balance, decreased coordination, decreased endurance, decreased mobility, difficulty walking, decreased ROM, decreased strength, hypomobility, increased muscle spasms, impaired sensation, and pain.   ACTIVITY LIMITATIONS: carrying, lifting, bending, sitting, standing, squatting, sleeping, stairs, transfers, and bed mobility  PARTICIPATION LIMITATIONS: cleaning, laundry, driving, shopping, community activity, and yard work  PERSONAL FACTORS: Time since  onset of injury/illness/exacerbation are also affecting patient's functional outcome.   REHAB POTENTIAL: Good  CLINICAL DECISION MAKING: Stable/uncomplicated  EVALUATION COMPLEXITY: Low   GOALS: Goals reviewed with patient? No  SHORT TERM GOALS: Target date: 11/28/23  Patient will be independent with performance of HEP to demonstrate adequate self management of symptoms.  Baseline:  Goal status: in progress  2.   Patient will report at least a 25% improvement with function or pain overall since beginning PT. Baseline:  Goal status: in progress   LONG TERM GOALS: Target date: 12/26/23 Patient will improve Oswestry score by  10   points in order to improve self-perceived disability and overall function.  Baseline: Goal status: in progress   2.  Patient will gain at least 25% of AROM in  lumbar extension with dec pain rating, 3/10 or lower, in order to demonstrate improved lumbar ROM for functional tasks at home such as putting things in taller cabinets.  Baseline:  Goal status: in progress  3. Patient will improve 5xSTS score to 20 seconds or less in order to demonstrate  improved LE strength/power for improved gait mechanics.  Baseline: Goal status: in progress   4.  Patient will report overall 50% improvement since beginning PT. Baseline:  Goal status: in progress   PLAN:  PT FREQUENCY: 1-2x/week  PT DURATION: 6 weeks  PLANNED INTERVENTIONS: 97164- PT Re-evaluation, 97110-Therapeutic exercises, 97530- Therapeutic activity, 97112- Neuromuscular re-education, 97535- Self Care, 81191- Manual therapy, [phone]- Gait training, Patient/Family education, Balance training, Stair training, Dry Needling, Joint mobilization, Spinal mobilization, and Moist heat.  PLAN FOR NEXT SESSION: declines dry needling, progress lumbar mobility as tolerated, LE strengthening, core strengthening   10:47 AM, 12/20/23 Breeanne Oblinger Small Hyatt Capobianco MPT  physical therapy [place] [phone] [phone]

## 2023-12-23 ENCOUNTER — Encounter (HOSPITAL_COMMUNITY)

## 2023-12-26 ENCOUNTER — Encounter (HOSPITAL_COMMUNITY): Payer: Self-pay

## 2023-12-26 ENCOUNTER — Encounter (HOSPITAL_COMMUNITY)

## 2023-12-26 ENCOUNTER — Telehealth (HOSPITAL_COMMUNITY): Payer: Self-pay

## 2023-12-26 NOTE — Telephone Encounter (Signed)
 Left message regarding missed appointment today; will discharge due to no show policy.  10:35 AM, 12/26/23 Ryonna Cimini Small Corneluis Allston MPT [place]  [phone]

## 2023-12-26 NOTE — Therapy (Signed)
 PHYSICAL THERAPY DISCHARGE SUMMARY  Visits from Start of Care: 2  Current functional level related to goals / functional outcomes: unknown   Remaining deficits: unknown   Education / Equipment: HEP   Patient agrees to discharge. Patient goals were not met. Patient is being discharged due to not returning since the last visit.  10:39 AM, 12/26/23 Polly Barner Small Sheily Lineman MPT [place] [facility] [place] [phone]

## 2023-12-29 ENCOUNTER — Encounter (HOSPITAL_COMMUNITY)

## 2024-03-27 DIAGNOSIS — K219 Gastro-esophageal reflux disease without esophagitis: Secondary | ICD-10-CM | POA: Diagnosis not present

## 2024-03-27 DIAGNOSIS — Z7189 Other specified counseling: Secondary | ICD-10-CM | POA: Diagnosis not present

## 2024-03-27 DIAGNOSIS — I2510 Atherosclerotic heart disease of native coronary artery without angina pectoris: Secondary | ICD-10-CM | POA: Diagnosis not present

## 2024-03-27 DIAGNOSIS — M179 Osteoarthritis of knee, unspecified: Secondary | ICD-10-CM | POA: Diagnosis not present

## 2024-03-27 DIAGNOSIS — I63511 Cerebral infarction due to unspecified occlusion or stenosis of right middle cerebral artery: Secondary | ICD-10-CM | POA: Diagnosis not present

## 2024-03-27 DIAGNOSIS — I10 Essential (primary) hypertension: Secondary | ICD-10-CM | POA: Diagnosis not present

## 2024-03-27 DIAGNOSIS — E782 Mixed hyperlipidemia: Secondary | ICD-10-CM | POA: Diagnosis not present

## 2024-03-27 DIAGNOSIS — J432 Centrilobular emphysema: Secondary | ICD-10-CM | POA: Diagnosis not present

## 2024-03-27 DIAGNOSIS — D126 Benign neoplasm of colon, unspecified: Secondary | ICD-10-CM | POA: Diagnosis not present

## 2024-03-27 DIAGNOSIS — Z720 Tobacco use: Secondary | ICD-10-CM | POA: Diagnosis not present

## 2024-03-27 DIAGNOSIS — E119 Type 2 diabetes mellitus without complications: Secondary | ICD-10-CM | POA: Diagnosis not present

## 2024-03-28 ENCOUNTER — Other Ambulatory Visit: Payer: Self-pay | Admitting: Family Medicine

## 2024-03-28 DIAGNOSIS — F17210 Nicotine dependence, cigarettes, uncomplicated: Secondary | ICD-10-CM

## 2024-04-05 ENCOUNTER — Ambulatory Visit
Admission: RE | Admit: 2024-04-05 | Discharge: 2024-04-05 | Disposition: A | Discharge status: Home / Self Care | Source: Ambulatory Visit | Attending: Family Medicine | Admitting: Family Medicine

## 2024-04-05 DIAGNOSIS — F17210 Nicotine dependence, cigarettes, uncomplicated: Secondary | ICD-10-CM

## 2024-05-31 ENCOUNTER — Telehealth: Payer: Self-pay

## 2024-05-31 NOTE — Telephone Encounter (Signed)
 Brandon Martin, with [facility] pharmacy called stating patient called and informed them that a prescription for Gabapentin  300 mg was being sent in. The prescription has not been sent to the pharmacy.

## 2024-06-01 MED ORDER — GABAPENTIN 800 MG PO TABS: 800.0000 mg | ORAL_TABLET | Freq: Three times a day (TID) | ORAL | 3 refills | Status: AC

## 2024-07-02 ENCOUNTER — Other Ambulatory Visit: Payer: Self-pay | Admitting: Cardiology

## 2024-07-10 NOTE — Progress Notes (Signed)
 Brandon Martin                                          MRN: [id number]   07/10/2024   The [facility] Quality Team Specialist reviewed this patient medical record for the purposes of chart review for care gap closure. The following were reviewed: chart review for care gap closure-kidney health evaluation for diabetes:eGFR  and uACR.    [facility]BCI Quality Team
